# Patient Record
Sex: Male | Born: 1935 | Race: Black or African American | Hispanic: No | Marital: Married | Smoking: Never smoker
Health system: Southern US, Community
[De-identification: ages and names within clinical notes are randomized; demographics above are authoritative.]

## PROBLEM LIST (undated history)

## (undated) DIAGNOSIS — I2510 Atherosclerotic heart disease of native coronary artery without angina pectoris: Secondary | ICD-10-CM

## (undated) DIAGNOSIS — M1990 Unspecified osteoarthritis, unspecified site: Secondary | ICD-10-CM

## (undated) DIAGNOSIS — E785 Hyperlipidemia, unspecified: Secondary | ICD-10-CM

## (undated) DIAGNOSIS — F0390 Unspecified dementia without behavioral disturbance: Secondary | ICD-10-CM

## (undated) DIAGNOSIS — I480 Paroxysmal atrial fibrillation: Secondary | ICD-10-CM

## (undated) DIAGNOSIS — C801 Malignant (primary) neoplasm, unspecified: Secondary | ICD-10-CM

## (undated) DIAGNOSIS — R001 Bradycardia, unspecified: Secondary | ICD-10-CM

## (undated) DIAGNOSIS — I10 Essential (primary) hypertension: Secondary | ICD-10-CM

## (undated) HISTORY — DX: Essential (primary) hypertension: I10

## (undated) HISTORY — PX: CORONARY ANGIOPLASTY: SHX604

## (undated) HISTORY — PX: TONSILLECTOMY: SUR1361

## (undated) HISTORY — DX: Hyperlipidemia, unspecified: E78.5

## (undated) HISTORY — DX: Paroxysmal atrial fibrillation: I48.0

## (undated) HISTORY — DX: Atherosclerotic heart disease of native coronary artery without angina pectoris: I25.10

## (undated) HISTORY — PX: CARDIAC CATHETERIZATION: SHX172

## (undated) HISTORY — PX: MULTIPLE TOOTH EXTRACTIONS: SHX2053

---

## 2006-10-16 ENCOUNTER — Ambulatory Visit: Payer: Self-pay | Admitting: Orthopedic Surgery

## 2007-01-16 ENCOUNTER — Ambulatory Visit: Payer: Self-pay | Admitting: Orthopedic Surgery

## 2007-04-14 ENCOUNTER — Ambulatory Visit: Payer: Self-pay | Admitting: Orthopedic Surgery

## 2007-12-11 ENCOUNTER — Ambulatory Visit: Payer: Self-pay | Admitting: Orthopedic Surgery

## 2007-12-11 DIAGNOSIS — M1710 Unilateral primary osteoarthritis, unspecified knee: Secondary | ICD-10-CM

## 2007-12-11 DIAGNOSIS — M25569 Pain in unspecified knee: Secondary | ICD-10-CM | POA: Insufficient documentation

## 2007-12-11 DIAGNOSIS — IMO0002 Reserved for concepts with insufficient information to code with codable children: Secondary | ICD-10-CM | POA: Insufficient documentation

## 2008-07-22 ENCOUNTER — Ambulatory Visit: Payer: Self-pay | Admitting: Orthopedic Surgery

## 2008-07-22 DIAGNOSIS — M48061 Spinal stenosis, lumbar region without neurogenic claudication: Secondary | ICD-10-CM | POA: Insufficient documentation

## 2008-08-12 ENCOUNTER — Ambulatory Visit (HOSPITAL_COMMUNITY): Admission: RE | Admit: 2008-08-12 | Discharge: 2008-08-12 | Payer: Self-pay | Admitting: Gastroenterology

## 2008-08-12 ENCOUNTER — Ambulatory Visit: Payer: Self-pay | Admitting: Gastroenterology

## 2008-10-15 ENCOUNTER — Emergency Department (HOSPITAL_COMMUNITY): Admission: EM | Admit: 2008-10-15 | Discharge: 2008-10-15 | Payer: Self-pay | Admitting: Emergency Medicine

## 2008-10-15 ENCOUNTER — Encounter: Payer: Self-pay | Admitting: Orthopedic Surgery

## 2008-11-14 ENCOUNTER — Emergency Department (HOSPITAL_COMMUNITY): Admission: EM | Admit: 2008-11-14 | Discharge: 2008-11-14 | Payer: Self-pay | Admitting: Emergency Medicine

## 2008-11-16 ENCOUNTER — Ambulatory Visit (HOSPITAL_COMMUNITY): Admission: RE | Admit: 2008-11-16 | Discharge: 2008-11-16 | Payer: Self-pay | Admitting: Family Medicine

## 2008-12-14 ENCOUNTER — Encounter (HOSPITAL_COMMUNITY): Admission: RE | Admit: 2008-12-14 | Discharge: 2008-12-23 | Payer: Self-pay | Admitting: Family Medicine

## 2008-12-30 ENCOUNTER — Encounter (HOSPITAL_COMMUNITY): Admission: RE | Admit: 2008-12-30 | Discharge: 2009-01-29 | Payer: Self-pay | Admitting: Family Medicine

## 2009-02-03 ENCOUNTER — Encounter (HOSPITAL_COMMUNITY): Admission: RE | Admit: 2009-02-03 | Discharge: 2009-03-05 | Payer: Self-pay | Admitting: Family Medicine

## 2009-02-07 ENCOUNTER — Ambulatory Visit: Payer: Self-pay | Admitting: Orthopedic Surgery

## 2009-06-13 ENCOUNTER — Ambulatory Visit (HOSPITAL_COMMUNITY): Admission: RE | Admit: 2009-06-13 | Discharge: 2009-06-13 | Payer: Self-pay

## 2010-05-11 ENCOUNTER — Ambulatory Visit: Payer: Self-pay | Admitting: Orthopedic Surgery

## 2010-05-11 DIAGNOSIS — M25559 Pain in unspecified hip: Secondary | ICD-10-CM | POA: Insufficient documentation

## 2010-05-12 ENCOUNTER — Telehealth: Payer: Self-pay | Admitting: Orthopedic Surgery

## 2010-05-15 ENCOUNTER — Encounter (INDEPENDENT_AMBULATORY_CARE_PROVIDER_SITE_OTHER): Payer: Self-pay | Admitting: *Deleted

## 2010-05-15 ENCOUNTER — Telehealth: Payer: Self-pay | Admitting: Orthopedic Surgery

## 2010-05-15 ENCOUNTER — Ambulatory Visit (HOSPITAL_COMMUNITY): Admission: RE | Admit: 2010-05-15 | Discharge: 2010-05-15 | Payer: Self-pay | Admitting: Orthopedic Surgery

## 2010-05-16 ENCOUNTER — Telehealth: Payer: Self-pay | Admitting: Orthopedic Surgery

## 2010-05-17 ENCOUNTER — Telehealth: Payer: Self-pay | Admitting: Orthopedic Surgery

## 2010-05-19 ENCOUNTER — Encounter: Payer: Self-pay | Admitting: Orthopedic Surgery

## 2010-07-11 ENCOUNTER — Encounter (INDEPENDENT_AMBULATORY_CARE_PROVIDER_SITE_OTHER): Payer: Self-pay | Admitting: *Deleted

## 2010-07-11 ENCOUNTER — Encounter: Payer: Self-pay | Admitting: Orthopedic Surgery

## 2010-08-09 ENCOUNTER — Telehealth: Payer: Self-pay | Admitting: Orthopedic Surgery

## 2010-08-09 ENCOUNTER — Encounter: Payer: Self-pay | Admitting: Orthopedic Surgery

## 2010-08-16 ENCOUNTER — Ambulatory Visit: Payer: Self-pay | Admitting: Orthopedic Surgery

## 2010-09-18 HISTORY — PX: OTHER SURGICAL HISTORY: SHX169

## 2010-09-23 ENCOUNTER — Emergency Department (HOSPITAL_COMMUNITY): Admission: EM | Admit: 2010-09-23 | Discharge: 2010-09-23 | Payer: Self-pay | Admitting: Emergency Medicine

## 2010-09-26 ENCOUNTER — Telehealth (INDEPENDENT_AMBULATORY_CARE_PROVIDER_SITE_OTHER): Payer: Self-pay | Admitting: *Deleted

## 2010-09-27 ENCOUNTER — Encounter (INDEPENDENT_AMBULATORY_CARE_PROVIDER_SITE_OTHER): Payer: Self-pay | Admitting: *Deleted

## 2010-10-03 ENCOUNTER — Encounter: Payer: Self-pay | Admitting: Orthopedic Surgery

## 2010-10-04 ENCOUNTER — Encounter: Payer: Self-pay | Admitting: Orthopedic Surgery

## 2010-10-05 ENCOUNTER — Emergency Department (HOSPITAL_COMMUNITY)
Admission: EM | Admit: 2010-10-05 | Discharge: 2010-10-05 | Payer: Self-pay | Source: Home / Self Care | Admitting: Emergency Medicine

## 2010-11-06 ENCOUNTER — Telehealth: Payer: Self-pay | Admitting: Orthopedic Surgery

## 2010-12-06 ENCOUNTER — Ambulatory Visit: Payer: Self-pay | Admitting: Orthopedic Surgery

## 2010-12-12 ENCOUNTER — Ambulatory Visit: Payer: Self-pay | Admitting: Orthopedic Surgery

## 2010-12-27 ENCOUNTER — Ambulatory Visit
Admission: RE | Admit: 2010-12-27 | Discharge: 2010-12-27 | Payer: Self-pay | Source: Home / Self Care | Attending: Orthopedic Surgery | Admitting: Orthopedic Surgery

## 2011-01-04 ENCOUNTER — Ambulatory Visit
Admission: RE | Admit: 2011-01-04 | Discharge: 2011-01-04 | Payer: Self-pay | Source: Home / Self Care | Attending: Orthopedic Surgery | Admitting: Orthopedic Surgery

## 2011-01-05 ENCOUNTER — Ambulatory Visit (HOSPITAL_COMMUNITY)
Admission: RE | Admit: 2011-01-05 | Discharge: 2011-01-05 | Payer: Self-pay | Source: Home / Self Care | Attending: Urology | Admitting: Urology

## 2011-01-23 NOTE — Miscellaneous (Signed)
Summary: oxycodone in drug test  Clinical Lists Changes   Advised Dr. Ozzie Hoyle office, no oxycodone given from our office on Norco and Tylenol 3. Pt had oxycodone in blood test

## 2011-01-23 NOTE — Progress Notes (Signed)
Summary: Patient called about Rosann Auerbach Rx question, call back to [facility]  Phone Note Call from Patient   Caller: Patient Summary of Call: Patient called to relay that he has still been unable to get the Synvisc through his mail order pharmacy, which is [place].  He states he needs a "Medicare denial" in order for his Rosann Auerbach Rx plan to cover it.  I explained that we are unable to bill Medicare first for any prescriptions as we are not a supplier.  Advised patient that he may need to contact Medicare directly, and that I will re-check w/his mail-in and local pharmacies.   I did contact both [employer] and [facility] to verify that this is what is requested.  Per [employer]: PH# [phone]. on Specialty Rx form signed by Dr Romeo Apple on 10/02/10   I spoke w/Idris, and he said that the order is cancelled until "Medicare denial is received."  He also suggests that patient contact Medicare, his Plan B, to discuss with them. (Note patient does not have Plan D Rx coverage with Medicare). [facility],[place] [phone], spoke w/pharmacist Elon Jester - states she still has his original Rx for Synvisc "on hold."  She cannot guarantee she can get a denial but said she will try to help hiim. Initial call taken by: Cammie Sickle,  November 06, 2010 1:49 PM  Follow-up for Phone Call        Elon Jester from [facility] called back to relay that she pursued the Synvisc Rx with Medicare and that "they will cover it only if our office purchases & dispenses it."  Message left for patient re:  responses received. Follow-up by: Cammie Sickle,  November 06, 2010 6:25 PM

## 2011-01-23 NOTE — Progress Notes (Signed)
Summary: Referral to Dr. Eduard Clos.  Phone Note Outgoing Call   Call placed by: Waldon Reining,  May 16, 2010 10:59 AM Call placed to: Specialist Action Taken: Information Sent Summary of Call: I faxed a referral for this patient to Dr. Eduard Clos for ESI injections.

## 2011-01-23 NOTE — Assessment & Plan Note (Signed)
Summary: FLUID LT KNEE/MEDICARE,BC,TRICARE/CAF    Chief Complaint:  left knee pain.  History of Present Illness: I saw Karl Howell in the office today for a followup visit.  He is a 77 years old man with the complaint of:  left knee pain and swelling.  Patient was seen 01-16-07 for this problem and was diagnosed with osteoarthritis of the left knee.  Patient has been having these symptoms for over 2 years. No injury. Pain is worse going up and down steps. Pain stays in knee. Patient does not have any stiffness. He says that some days are better than others. His pain is achy.   Patient does not take any medicine for this. He has had fluid drawn off by [place] before and that helped for a while.  Last xrays were in 2007.   Prior Medications :  * MAXIDE  * LOPRESSOR  * ZOCOR     Current Allergies (reviewed today): No known allergies  Updated/Current Medications (including changes made in today's visit):  * MAXIDE  * LOPRESSOR  * ZOCOR    Past Medical History:    Reviewed history from 12/03/2007 and no changes required:       controlled htn       Knee Exam  General:    Well-developed, well-nourished, normal body habitus; no deformities, normal grooming.  Gait:    limp noted-left.    Skin:    Intact, no scars, lesions, rashes, cafe au lait spots, or bruising.    Inspection:    deformity: left knee, valgus mild   Palpation:    tenderness L-lateral joint line.    Vascular:    There was no swelling or varicose veins. The pulses and temperature are normal. There was no edema or tenderness.  Sensory:    Gross coordination and sensation were normal.    Motor:    Motor strength 5/5 bilaterally for quadriceps, hamstrings, ankle dorsiflexion, and ankle plantar flexion.    Knee Exam:    Left:    Inspection:  Abnormal    Palpation:  Abnormal    Stability:  stable    Tenderness:  lateral capsule    Swelling:  no    Range of Motion:       Flexion-Active: 120 degrees       Extension-Active: -2 degrees  Anterior drawer:    Left negative Posterior drawer:    Left negative Lachman :    Left negative MCL:    Left negative LCL:    Left negative    Impression & Recommendations:  Problem # 1:  DEGENERATIVE JOINT DISEASE, KNEE (ICD-715.96)  Orders: Est. Patient Level III (16109) Depo- Medrol 40mg  (J1030) Joint Aspirate / Injection, Large (60454)  Verbal consent was obtained. The left knee was prepped with alcohol and ethyl chloride. depomedrol 40mg /cc and sensorcaine .25% was injected. there were no complications.  You have received an injection of cortisone today. You may experience increased pain at the injection site. Apply ice pack to the area for 20 minutes every 2 hours and take 2 xtra strength tylenol every 8 hours. This increased pain will usually resolve in 24 hours. The injection will take effect in 3-10 days.   Knee x-ray complete,  4/more views (09811)   Problem # 2:  KNEE PAIN (ICD-719.46)  Orders: Est. Patient Level III (91478)    Patient Instructions: 1)  Please schedule a follow-up appointment as needed. 2)  You have received an injection of cortisone today. You may experience increased  pain at the injection site. Apply ice pack to the area for 20 minutes every 2 hours and take 2 xtra strength tylenol every 8 hours. This increased pain will usually resolve in 24 hours. The injection will take effect in 3-10 days.     ]

## 2011-01-23 NOTE — Consult Note (Signed)
Summary: Consult Dr Nickola Major  Consult Dr Nickola Major   Imported By: Cammie Sickle 08/15/2010 11:53:10  _____________________________________________________________________  External Attachment:    Type:   Image     Comment:   External Document

## 2011-01-23 NOTE — Progress Notes (Signed)
Summary: Office Visit  Office Visit   Imported By: Jacklynn Ganong 12/03/2007 15:11:43  _____________________________________________________________________  External Attachment:    Type:   Image     Comment:   progress note

## 2011-01-23 NOTE — Letter (Signed)
Summary: ESI referral order  ESI referral order   Imported By: Jacklynn Ganong 07/13/2010 10:20:04  _____________________________________________________________________  External Attachment:    Type:   Image     Comment:   External Document

## 2011-01-23 NOTE — Progress Notes (Signed)
Summary: Appointment to see Dr. Eduard Clos.  Phone Note From Other Clinic   Caller: Referral Coordinator Summary of Call: Melissa called and said they could not get the patient in for an injection before his trip because he is on Plavix, so they  have him scheduled when he gets back. They did give him a prescription for Lyrica for to help with his pain. Initial call taken by: Waldon Reining,  May 17, 2010 3:28 PM

## 2011-01-23 NOTE — Assessment & Plan Note (Signed)
  Xrays show valgus OA left knee moderate

## 2011-01-23 NOTE — Assessment & Plan Note (Signed)
Summary: RE-CHECK LT KNEE/NEED XRAYS/MEDICARE,BCBS,TRICARE/CAF   Visit Type:  Follow-up  CC:  recheck left knee with xrays.  History of Present Illness: 77 year old male previously treated for osteoarthritis of LEFT knee.  Was advised to have a knee replacement but he said he had some traveling to doing what rather rate to the trips are complete.  DX: OA LEFT KNEE; SPINAL STENOSIS  PREV TREATMENT: INJECT LEFT KNEE   Presents back today for recurrent pain in the LEFT knee, left hip and left leg.   02/07/09 last injection left knee for DJD, OA, the knee injection helped.  He went to the [place], received Prednisone  5 mg;   12 days, today is 4th day of med, the med does not help.     Allergies: No Known Drug Allergies  Past History:  Past Medical History: Last updated: 12/03/2007 controlled htn  Past Surgical History: Last updated: 12/03/2007 none  Review of Systems Constitutional:  Denies weight loss, fever, and chills. Neurologic:  Denies numbness and tingling. Musculoskeletal:  Complains of joint pain, stiffness, and muscle pain. Skin:  Denies rash.  Physical Exam  General:  Well developed, well nourished, normal body habitus; no deformities, normal grooming. Pulses:  The pulses and perfusion were normal with normal color, temperature  and no swelling  Extremities:  tenderness in the left gluteus, left ischium,   left knee normal ROM , no effusion, no tenderness, grade 5 motor  Neurologic:  The coordination and sensation were normal  The reflexes were normal  + SLR left sitting at 60 degrees   Skin:  intact without lesions or rashes Cervical Nodes:  no significant adenopathy Psych:  alert and cooperative; normal mood and affect; normal attention span and concentration   Impression & Recommendations:  Problem # 1:  SPINAL STENOSIS, LUMBAR (ICD-724.02) Assessment Deteriorated  injection left hip for pain  neurontin pain reliever   Orders: Est. Patient  Level III (91478) Joint Aspirate / Injection, Large (20610) Depo- Medrol 40mg  (J1030)  Medications Added to Medication List This Visit: 1)  Neurontin 100 Mg Caps (Gabapentin) .[name].. 1 by mouth three times a day 2)  Norco 5-325 Mg Tabs (Hydrocodone-acetaminophen) .[name].. 1 q 6 hrs as needed pain  Patient Instructions: 1)  Start Neurontin 100 mg three times a day  2)  MRI L spine ASAP to prepare for epidurals  Prescriptions: NORCO 5-325 MG TABS (HYDROCODONE-ACETAMINOPHEN) 1 q 6 hrs as needed pain  #60 x 1   Entered and Authorized by:   Fuller Canada MD   Signed by:   Fuller Canada MD on 05/11/2010   Method used:   Print then Give to Patient   RxID:   [id number] NEURONTIN 100 MG CAPS (GABAPENTIN) 1 by mouth three times a day  #60 x 1   Entered and Authorized by:   Fuller Canada MD   Signed by:   Fuller Canada MD on 05/11/2010   Method used:   Print then Give to Patient   RxID:   [id number]

## 2011-01-23 NOTE — Assessment & Plan Note (Signed)
Summary: RE-CK LT KNEE,REVIEW RESULTS/MEDICARE/CAF   Visit Type:  Follow-up  CC:  recheck back pain.  History of Present Illness: 77 year old male followed for osteoarthritis of his LEFT knee and spinal stenosis.  We advised the replacement surgery but the patient was headed to [place] believe [place] and had to postpone surgery.  He did eventually go to [place] and during that time his back pain and knee pain improved.  He just has a small amount of symptoms at this time.  DX: OA LEFT KNEE; SPINAL STENOSIS  PREV TREATMENT: INJECT LEFT KNEE   Presents back today for recurrent pain in the LEFT knee, left hip and left leg.   the MRI which was doneIn the last month shows the following:  IMPRESSION: 1.  Transitional anatomy. Correlation with radiographs is recommended prior to any operative intervention. 2.  Chronic severe lower lumbar facet degeneration.  Mildly increased spondylolisthesis at L4-L5 and L5-S1 since 2009, grade 1. Superimposed chronic lower lumbar disc degeneration. 3.  Multifactorial moderate to severe spinal stenosis at L3-L4. Moderate spinal stenosis at L4-L5 with severe bilateral lateral recess stenosis. 4.  Severe left lateral recess stenosis and moderate bilateral neural foraminal  stenosis at L5-S1.   right now he is doing well and we would like to postpone any further intervention at this time   Allergies (verified): No Known Drug Allergies   Impression & Recommendations:  Problem # 1:  SPINAL STENOSIS, LUMBAR (ICD-724.02) Assessment Comment Only  Problem # 2:  DEGENERATIVE JOINT DISEASE, KNEE (ICD-715.96) Assessment: Comment Only  His updated medication list for this problem includes:     Nabumetone 500 Mg Tabs (Nabumetone) .[name]... 1 by mouth two times a day    Tylenol With Codeine #3 300-30 Mg Tabs (Acetaminophen-codeine) .[name]... 1 q 4 as needed pain   We decided to treat it with an anti-inflammatory and a light pain reliever, with codeine.  The Norco has  been stopped  Orders: Est. Patient Level II (32440)  Medications Added to Medication List This Visit: 1)  Nabumetone 500 Mg Tabs (Nabumetone) .[name].. 1 by mouth two times a day 2)  Tylenol With Codeine #3 300-30 Mg Tabs (Acetaminophen-codeine) .[name].. 1 q 4 as needed pain  Patient Instructions: 1)  3 month follow up  2)  2 medicines to take  Prescriptions: TYLENOL WITH CODEINE #3 300-30 MG TABS (ACETAMINOPHEN-CODEINE) 1 q 4 as needed pain  #84 x 5   Entered and Authorized by:   Fuller Canada MD   Signed by:   Fuller Canada MD on 08/16/2010   Method used:   Print then Give to Patient   RxID:   [id number] NABUMETONE 500 MG TABS (NABUMETONE) 1 by mouth two times a day  #60 x 2   Entered and Authorized by:   Fuller Canada MD   Signed by:   Fuller Canada MD on 08/16/2010   Method used:   Print then Give to Patient   RxID:   [id number]

## 2011-01-23 NOTE — Progress Notes (Signed)
Summary: MRI appointment  Phone Note Outgoing Call   Call placed by: Waldon Reining,  May 12, 2010 9:45 AM Call placed to: Patient Action Taken: Appt scheduled Summary of Call: I called and left a message with patient's MRI appointment at [facility] on 05-15-10 at 8:30. Patient has Medicare, no precert is needed.

## 2011-01-23 NOTE — Miscellaneous (Signed)
Summary: refaxed ESI referral to  [facility]  Clinical Lists Changes

## 2011-01-23 NOTE — Progress Notes (Signed)
Summary: pharmacycall about Synvisc Rx   Phone Note From Pharmacy   [facility] Summary of Call: Rec'd [facility] from [facility]: Synvisc [facility]. PH [phone]. Left voice msg - states patient's insurance (Medicare) does not cover; however, patient has a separate private RX plan with [employer] ins.  - I called patient; left message to fol up on status.  - [facility]; spoke w/Scott, pharmacist; they have faxed the Rx to [employer] to ck on available benefits for this Rx through them. Initial call taken by: Cammie Sickle,  May 15, 2010 1:20 PM

## 2011-01-23 NOTE — Medication Information (Signed)
Summary: [facility] fax verification  [facility] fax verification   Imported By: Cammie Sickle 05/30/2010 12:02:36  _____________________________________________________________________  External Attachment:    Type:   Image     Comment:   External Document

## 2011-01-23 NOTE — Assessment & Plan Note (Signed)
Summary: LT KNEE PAIN"TO HIP"/HAD [facility] 11/2007/?XRAY/MEDICARE,BC,TRIC...    History of Present Illness: I saw Karl Howell in the office today for a followup visit.  He is a 77 years old man with the complaint of:  left hip pain (buttocks).  Patient states that he has had this problem for a long time, it usually comes and goes.   He was last seen for this on 04-14-07, but it was the right side.  He says that he has a bad back.  He has not tried any medication, he only takes aspirin.  Interestingly he says that when he walks his pain gets better. His symptoms are more constant now. When he sits he has pain. The pain in his left buttock.    Updated Prior Medication List: * MAXIDE  * LOPRESSOR  * ZOCOR   Current Allergies: No known allergies      Review of Systems       no radiation of pain to the legs.   Physical Exam  This is a well-developed well-nourished male Coumadin hygiene and tach is awake alert nighttime 3 mood and affect are normal normal cardiovascular exam. Skin is warm dry and intact.  Back examination  No tenderness in the lumbar spine no tenderness in the left buttock. Leg lengths are equal. Hip range of motion normal without pain. Straight leg raise negative. He did have pain on extension of the back.    Impression & Recommendations:  Problem # 1:  SPINAL STENOSIS, LUMBAR (ICD-724.02) Assessment: Deteriorated previous x-rays show spinal stenosis. Orders: Est. Patient Level III (16109)   Medications Added to Medication List This Visit: 1)  Sterapred Ds 12 Day 10 Mg Tabs (Prednisone) .... As diorected   Patient Instructions: 1)  take dose pack as directed  2)  continue to walk daily  3)  Please schedule a follow-up appointment as needed.   Prescriptions: STERAPRED DS 12 DAY 10 MG  TABS (PREDNISONE) as diorected  #1 x 1   Entered and Authorized by:   Fuller Canada MD   Signed by:   Fuller Canada MD on 07/22/2008   Method used:   [facility]  to ...       [facility] [phone]*       [address]       [place], [place]  [address]       Ph: [phone] or [phone]       Fax: [phone]   RxID:   [id number]  ]

## 2011-01-23 NOTE — Progress Notes (Signed)
Summary: Patient requesting results and appointment  Phone Note Call from Patient   Caller: Patient Summary of Call: Patient stopped in to request appt for LT knee "Same problem started with, in May."  He travels w/job and is back in town today.    Has seen Dr Eduard Clos, and reports have been requested today as not received.  - (1) Patient said he has brought in MRI film and has not received the results.  - (2) Please advise if need to schedule fol/up appointment here re: knee as patient is requesting. CELL PH # [phone] /  Home Ph [phone] Initial call taken by: Cammie Sickle,  August 09, 2010 3:01 PM  Follow-up for Phone Call        yes   appt check knee   re visit problem with dr Eduard Clos amd MRI results  Follow-up by: Fuller Canada MD,  August 10, 2010 3:06 PM  Additional Follow-up for Phone Call Additional follow up Details #1::        Called patient and left voice mail msg ( @both  #'s) Additional Follow-up by: Cammie Sickle,  August 11, 2010 9:52 AM    Additional Follow-up for Phone Call Additional follow up Details #2::    Pt left msg on our ans machine.  I left fol/up msg at cell, offered Wed appt. Follow-up by: Cammie Sickle,  August 14, 2010 12:47 PM  Additional Follow-up for Phone Call Additional follow up Details #3:: Details for Additional Follow-up Action Taken: (1) Received consult report (2) Spoke w/patient and scheduled appt per Dr Romeo Apple Additional Follow-up by: Cammie Sickle,  August 15, 2010 11:45 AM

## 2011-01-23 NOTE — Assessment & Plan Note (Signed)
Summary: knee pain, giving out/frs    History of Present Illness: I saw Karl Howell in the office today for a followup visit.  He is a 77 years old man with the complaint of: " leg bothering me" "right in the knee"  I cant do the things I want to do. I can't ski. I am having trouble doing simple things such as walking.  The knee gave out going up a hill  12/11/07 last left knee xrays, also has l spine xrays from 10/15/08 for review.  Diffuse knee pain   xrays show lateral compartment OA, PTF OA        Updated Prior Medication List: * MAXIDE  * LOPRESSOR  * ZOCOR  STERAPRED DS 12 DAY 10 MG  TABS (PREDNISONE) as diorected  Current Allergies (reviewed today): No known allergies   Past Medical History:    Reviewed history from 12/03/2007 and no changes required:       controlled htn  Past Surgical History:    Reviewed history from 12/03/2007 and no changes required:       none     Review of Systems      See HPI  MS      Complains of joint pain.   Physical Exam  Constitutional: normal appearance   CDV: normal pulse and perfusion  Skin: normal  Neuro: normal sensation  Psyche: awake and alert  MSK:  the LEFT knee is tender and swollen, there is normal muscle tone, flexion is 118.  Knee is stable.  There is medial joint line and lateral joint line tenderness.    Impression & Recommendations:  Problem # 1:  SPINAL STENOSIS, LUMBAR (ICD-724.02) Assessment: Unchanged  Problem # 2:  KNEE PAIN (EAV-409.81) Assessment: Deteriorated  Orders: Est. Patient Level III (19147) Joint Aspirate / Injection, Large (20610)- LEFT knee Verbal consent was obtained. The knee was prepped with alcohol and ethyl chloride. 1 cc of depomedrol 40mg /cc and 4 cc of lidocaine 1% was injected. there were no complications.  Depo- Medrol 40mg  (J1030)   Problem # 3:  DEGENERATIVE JOINT DISEASE, KNEE (ICD-715.96) Assessment: Deteriorated We discussed treatment options at this  point and basically wants to look at his x-rays he has end-stage arthritis of the LEFT knee although some of the cartilage is still preserved with open joint space.  Arthroscopic surgery is not proven to be beneficial in this setting unless they're mechanical symptoms such as locking clicking popping or giving way.  He does have some lumbar spinal stenosis which I think is the cause of his leg giving out as this happen with no excessive loading of the joint no twisting or simple walking.  I offered him an injection and patient Brochure  on total knee arthroplasty and he will think about having the surgery prior to [place] as a 6 month day for a trip to [place]. Orders: Est. Patient Level III (82956)    Patient Instructions: 1)  Please schedule a follow-up appointment as needed.

## 2011-01-23 NOTE — Medication Information (Signed)
Summary: Form for Synvisc  Form for Synvisc   Imported By: Jacklynn Ganong 10/04/2010 08:15:20  _____________________________________________________________________  External Attachment:    Type:   Image     Comment:   External Document

## 2011-01-23 NOTE — Miscellaneous (Signed)
Summary: L:4-5 esi order  Clinical Lists Changes  SI L4-L5 series of 3 injections Orders: Added new Referral order of Misc. Referral (Misc. Ref) - Signed

## 2011-01-23 NOTE — Progress Notes (Signed)
Summary: call from pharmacy   Phone Note From Pharmacy   Caller: [facility] Summary of Call: [facility] (ph [phone]) called  RE: Rx for Synvisc,  to relay that patient has [employer] for injectable medications.  They have fwd'd his RX to [place]. Their ph # is [phone].  [facility] has notified patient of this information (states left him a message). Initial call taken by: Cammie Sickle,  September 26, 2010 1:31 PM  Follow-up for Phone Call        ok Follow-up by: Ether Griffins,  September 26, 2010 1:43 PM

## 2011-01-23 NOTE — Letter (Signed)
Summary: Drug screen  report  Drug screen  report   Imported By: Jacklynn Ganong 10/03/2010 16:26:38  _____________________________________________________________________  External Attachment:    Type:   Image     Comment:   External Document

## 2011-01-25 NOTE — Assessment & Plan Note (Signed)
Summary: 2nd synvisc injection/mcr/   Visit Type:  Follow-up  CC:  left knee pain.  History of Present Illness: I saw Karl Howell in the office today for a followup visit.  He is a 77 years old man with the complaint of:  left knee.  2nd synvisc injection today.  CC:  left knee pain.  History of Present Illness: I saw Karl Howell in the office today for a followup visit.  He is a 77 years old man with the complaint of:  left knee  2nd  Synvisc injection today.   The patient is having pain and loss of function in the knee. Other conservative measures have failed (nsaids, rest, exercise, attempts at wt loss)   LEFT knee.  Sterile prep the LEFT knee and ethylchloride for anesthesia. Lateral portal injection of Synvisc. No complications   Allergies:  No Known Drug Allergies   Impression & Recommendations:  Problem # 1:  KNEE PAIN (ICD-719.46)  His updated medication list for this problem includes:    Norco 5-325 Mg Tabs (Hydrocodone-acetaminophen) .[name]... 1 q 6 hrs as needed pain    Nabumetone 500 Mg Tabs (Nabumetone) .[name]... 1 by mouth two times a day    Tylenol With Codeine #3 300-30 Mg Tabs (Acetaminophen-codeine) .[name]... 1 q 4 as needed pain  Orders: Synvisc (20610M)  Problem # 2:  DEGENERATIVE JOINT DISEASE, KNEE (ICD-715.96)  His updated medication list for this problem includes:    Norco 5-325 Mg Tabs (Hydrocodone-acetaminophen) .[name]... 1 q 6 hrs as needed pain    Nabumetone 500 Mg Tabs (Nabumetone) .[name]... 1 by mouth two times a day    Tylenol With Codeine #3 300-30 Mg Tabs (Acetaminophen-codeine) .[name]... 1 q 4 as needed pain  Orders: Synvisc (21308M)  Patient Instructions: 1)  Please schedule a follow-up appointment in 2 weeks. 2)  2nd synvisc injection    Orders Added: 1)  Synvisc [20610M]   Allergies: No Known Drug Allergies   Impression & Recommendations:  Problem # 1:  DEGENERATIVE JOINT DISEASE, KNEE (ICD-715.96) Assessment Comment Only  His updated  medication list for this problem includes:    Norco 5-325 Mg Tabs (Hydrocodone-acetaminophen) .[name]... 1 q 6 hrs as needed pain    Nabumetone 500 Mg Tabs (Nabumetone) .[name]... 1 by mouth two times a day    Tylenol With Codeine #3 300-30 Mg Tabs (Acetaminophen-codeine) .[name]... 1 q 4 as needed pain  Orders: Synvisc (20610M)  Problem # 2:  KNEE PAIN (VHQ-469.62) Assessment: Comment Only  His updated medication list for this problem includes:    Norco 5-325 Mg Tabs (Hydrocodone-acetaminophen) .[name]... 1 q 6 hrs as needed pain    Nabumetone 500 Mg Tabs (Nabumetone) .[name]... 1 by mouth two times a day    Tylenol With Codeine #3 300-30 Mg Tabs (Acetaminophen-codeine) .[name]... 1 q 4 as needed pain  Orders: Synvisc (20610M)  Patient Instructions: 1)  1 week for injection of synvisc    Orders Added: 1)  Synvisc [20610M]

## 2011-01-25 NOTE — Assessment & Plan Note (Signed)
Summary: INJECTION #3 SYNVISC/LT KNEE/MEDICARE,CIGNA,TRICARE/CAF   History of Present Illness: Karl Howell presents for his third of 3 Synvisc injections LEFT knee fracture arthritis failure of nonoperative and non-invasive care  Under sterile conditions his own Synvisc medication was injected into the LEFT knee but no complications      Allergies: No Known Drug Allergies   Other Orders: Synvisc (04540J)  Patient Instructions: 1)  Please schedule a follow-up appointment in 2 months.   Orders Added: 1)  Synvisc [20610M]

## 2011-01-25 NOTE — Assessment & Plan Note (Signed)
Summary: 3 M RE-CK/RESP TO MED/DISCUSS SYNV RX/MEDICARE,CIGNA,TRICARE/CAF   Visit Type:  Follow-up  CC:  back and knees.  History of Present Illness: a 89, male with diagnosis of degenerative disc disease, and osteoarthritis, LEFT knee  Recheck to response of medication  Nabumetone 500 Mg Tabs (Nabumetone) .[name]... 1 by mouth two times a day Tylenol With Codeine #3 300-30 Mg Tabs (Acetaminophen-codeine) .[name]... 1 q 4 as needed pain  he really only takes the medication, 20s, hurting. He will take an occasional tramadol for his back, which is feeling better since he got a special new shoes  He also needs a new prescription for Synvisc.  otherwise he is in stable condition except for his knee pain    Allergies: No Known Drug Allergies   Impression & Recommendations:  Problem # 1:  SPINAL STENOSIS, LUMBAR (ICD-724.02)  Orders: Est. Patient Level II ([address])  Problem # 2:  DEGENERATIVE JOINT DISEASE, KNEE (ICD-715.96)  His updated medication list for this problem includes:    Norco 5-325 Mg Tabs (Hydrocodone-acetaminophen) .[name]... 1 q 6 hrs as needed pain    Nabumetone 500 Mg Tabs (Nabumetone) .[name]... 1 by mouth two times a day    Tylenol With Codeine #3 300-30 Mg Tabs (Acetaminophen-codeine) .[name]... 1 q 4 as needed pain  Orders: Est. Patient Level II (60454)  Patient Instructions: 1)  Take your Synvisc rx to your pharmacy, call us when you get the medication and we will get you in for your 1st Synvisc injection.   Orders Added: 1)  Est. Patient Level II [09811]

## 2011-01-25 NOTE — Medication Information (Signed)
Summary: Tax adviser   Imported By: Cammie Sickle 12/07/2010 11:33:26  _____________________________________________________________________  External Attachment:    Type:   Image     Comment:   External Document

## 2011-01-25 NOTE — Assessment & Plan Note (Signed)
Summary: 1st synvisc injection/frs   Visit Type:  Follow-up  CC:  left knee pain.  History of Present Illness: I saw Karl Howell in the office today for a followup visit.  He is a 77 years old man with the complaint of:  left knee  1st Synvisc injection today.    The patient is having pain and loss of function in the knee. Other conservative measures have failed (nsaids, rest, exercise, attempts at wt loss)   LEFT knee.  Sterile prep the LEFT knee and ethylchloride for anesthesia. Lateral portal injection of Synvisc. No complications   Allergies: No Known Drug Allergies   Impression & Recommendations:  Problem # 1:  KNEE PAIN (ICD-719.46)  His updated medication list for this problem includes:    Norco 5-325 Mg Tabs (Hydrocodone-acetaminophen) .[name]... 1 q 6 hrs as needed pain    Nabumetone 500 Mg Tabs (Nabumetone) .[name]... 1 by mouth two times a day    Tylenol With Codeine #3 300-30 Mg Tabs (Acetaminophen-codeine) .[name]... 1 q 4 as needed pain  Orders: Synvisc (20610M)  Problem # 2:  DEGENERATIVE JOINT DISEASE, KNEE (ICD-715.96)  His updated medication list for this problem includes:    Norco 5-325 Mg Tabs (Hydrocodone-acetaminophen) .[name]... 1 q 6 hrs as needed pain    Nabumetone 500 Mg Tabs (Nabumetone) .[name]... 1 by mouth two times a day    Tylenol With Codeine #3 300-30 Mg Tabs (Acetaminophen-codeine) .[name]... 1 q 4 as needed pain  Orders: Synvisc (81191Y)  Patient Instructions: 1)  Please schedule a follow-up appointment in 2 weeks. 2)  2nd synvisc injection    Orders Added: 1)  Synvisc [20610M]

## 2011-03-08 ENCOUNTER — Ambulatory Visit: Payer: Self-pay | Admitting: Orthopedic Surgery

## 2011-03-08 LAB — URINALYSIS, ROUTINE W REFLEX MICROSCOPIC
Bilirubin Urine: NEGATIVE
Glucose, UA: NEGATIVE mg/dL
Hgb urine dipstick: NEGATIVE
Ketones, ur: NEGATIVE mg/dL
Nitrite: NEGATIVE
Protein, ur: NEGATIVE mg/dL
Specific Gravity, Urine: <1.005 — ABNORMAL LOW (ref 1.005–1.030)
Urobilinogen, UA: 0.2 mg/dL (ref 0.0–1.0)
pH: 7 (ref 5.0–8.0)

## 2011-03-08 LAB — BASIC METABOLIC PANEL WITH GFR
BUN: 14 mg/dL (ref 6–23)
BUN: 15 mg/dL (ref 6–23)
CO2: 27 meq/L (ref 19–32)
CO2: 30 meq/L (ref 19–32)
Calcium: 9.3 mg/dL (ref 8.4–10.5)
Calcium: 9.3 mg/dL (ref 8.4–10.5)
Chloride: 100 meq/L (ref 96–112)
Chloride: 101 meq/L (ref 96–112)
Creatinine, Ser: 1.2 mg/dL (ref 0.4–1.5)
Creatinine, Ser: 1.21 mg/dL (ref 0.4–1.5)
GFR calc non Af Amer: 59 mL/min — ABNORMAL LOW
GFR calc non Af Amer: 59 mL/min — ABNORMAL LOW
Glucose, Bld: 120 mg/dL — ABNORMAL HIGH (ref 70–99)
Glucose, Bld: 123 mg/dL — ABNORMAL HIGH (ref 70–99)
Potassium: 3.3 meq/L — ABNORMAL LOW (ref 3.5–5.1)
Potassium: 3.5 meq/L (ref 3.5–5.1)
Sodium: 136 meq/L (ref 135–145)
Sodium: 137 meq/L (ref 135–145)

## 2011-03-08 LAB — CBC
HCT: 37.3 % — ABNORMAL LOW (ref 39.0–52.0)
HCT: 38.9 % — ABNORMAL LOW (ref 39.0–52.0)
Hemoglobin: 12.4 g/dL — ABNORMAL LOW (ref 13.0–17.0)
Hemoglobin: 12.9 g/dL — ABNORMAL LOW (ref 13.0–17.0)
MCH: 27.2 pg (ref 26.0–34.0)
MCH: 27.3 pg (ref 26.0–34.0)
MCHC: 33.2 g/dL (ref 30.0–36.0)
MCHC: 33.2 g/dL (ref 30.0–36.0)
MCV: 82.1 fL (ref 78.0–100.0)
MCV: 82.2 fL (ref 78.0–100.0)
Platelets: 126 10*3/uL — ABNORMAL LOW (ref 150–400)
Platelets: 126 10*3/uL — ABNORMAL LOW (ref 150–400)
RBC: 4.55 MIL/uL (ref 4.22–5.81)
RBC: 4.74 MIL/uL (ref 4.22–5.81)
RDW: 13.1 % (ref 11.5–15.5)
RDW: 13.1 % (ref 11.5–15.5)
WBC: 5.2 10*3/uL (ref 4.0–10.5)
WBC: 6 10*3/uL (ref 4.0–10.5)

## 2011-03-08 LAB — DIFFERENTIAL
Basophils Absolute: 0 10*3/uL (ref 0.0–0.1)
Basophils Absolute: 0 10*3/uL (ref 0.0–0.1)
Basophils Relative: 0 % (ref 0–1)
Basophils Relative: 0 % (ref 0–1)
Eosinophils Absolute: 0 10*3/uL (ref 0.0–0.7)
Eosinophils Absolute: 0 10*3/uL (ref 0.0–0.7)
Eosinophils Relative: 0 % (ref 0–5)
Eosinophils Relative: 0 % (ref 0–5)
Lymphocytes Relative: 7 % — ABNORMAL LOW (ref 12–46)
Lymphocytes Relative: 9 % — ABNORMAL LOW (ref 12–46)
Lymphs Abs: 0.4 10*3/uL — ABNORMAL LOW (ref 0.7–4.0)
Lymphs Abs: 0.5 10*3/uL — ABNORMAL LOW (ref 0.7–4.0)
Monocytes Absolute: 0.2 10*3/uL (ref 0.1–1.0)
Monocytes Absolute: 0.3 10*3/uL (ref 0.1–1.0)
Monocytes Relative: 3 % (ref 3–12)
Monocytes Relative: 6 % (ref 3–12)
Neutro Abs: 4.5 10*3/uL (ref 1.7–7.7)
Neutro Abs: 5.3 10*3/uL (ref 1.7–7.7)
Neutrophils Relative %: 87 % — ABNORMAL HIGH (ref 43–77)
Neutrophils Relative %: 87 % — ABNORMAL HIGH (ref 43–77)

## 2011-04-03 ENCOUNTER — Ambulatory Visit (INDEPENDENT_AMBULATORY_CARE_PROVIDER_SITE_OTHER): Payer: Medicare Other | Admitting: Orthopedic Surgery

## 2011-04-03 DIAGNOSIS — M1710 Unilateral primary osteoarthritis, unspecified knee: Secondary | ICD-10-CM

## 2011-04-03 DIAGNOSIS — IMO0002 Reserved for concepts with insufficient information to code with codable children: Secondary | ICD-10-CM

## 2011-04-03 NOTE — Progress Notes (Signed)
77 year old male, status post 3 injections, LEFT knee for arthritis. Did well over his vacation until he tried to do some dancing he twisted and felt a pain. He really talked to him though his pain is intermittent. It reaches a level of 2/10 with occasional giving way, which is not constant.  He has no fluid on the knee today. He has near full extension, good flexion. Knee stable.  Impression is osteoarthritis, LEFT knee, status post Synvisc injections. Followup 3 months x-ray LEFT knee 3 views

## 2011-05-08 NOTE — Op Note (Signed)
NAMEJERRIT, Karl Howell                   ACCOUNT NO.:  [id number]   MEDICAL RECORD NO.:  [id number]          PATIENT TYPE:  AMB   LOCATION:  DAY                           FACILITY:  [facility]   PHYSICIAN:  Kassie Mends, M.D.      DATE OF BIRTH:  07-02-1934   DATE OF PROCEDURE:  08/12/2008  DATE OF DISCHARGE:                               OPERATIVE REPORT   REFERRING PHYSICIAN:  Melvyn Novas, MD   PROCEDURE:  Colonoscopy.   INDICATION FOR EXAMINATION:  Karl Howell is a 77 year old male who presents  for average risk colon cancer screening.   FINDINGS:  Rare sigmoid colon diverticula.  Moderate internal  hemorrhoids.  Otherwise, no polyps, masses, inflammatory changes, or  arteriovenous malformation seen.   RECOMMENDATIONS:  1. Screening colonoscopy in 10 years.  2. He should follow a high-fiber diet.  He is given a handout on high-      fiber diet, diverticulosis, and hemorrhoids.   MEDICATIONS:  1. Demerol 75 mg IV.  2. Versed 3 mg IV.   PROCEDURE TECHNIQUE:  Physical exam was performed and informed consent  was obtained from the patient after explaining the benefits, risks, and  alternatives of the procedure.  The patient was connected to the monitor  and placed in the left lateral position.  Continuous oxygen was provided  by nasal cannula and IV medicine administered with an indwelling  cannula.  After administration of sedation and rectal exam, the  patient's rectum was intubated and scope was advanced under direct  visualization to the cecum.  The scope was removed slowly by carefully  examining the color, texture, anatomy, and integrity of the mucosa on  the way out.  The patient was recovered in endoscopy and discharged home  in satisfactory condition.      Kassie Mends, M.D.  Electronically Signed    SM/MEDQ  D:  08/12/2008  T:  08/12/2008  Job:  [id number]   cc:   Melvyn Novas, MD  Fax: [phone]

## 2011-06-06 ENCOUNTER — Other Ambulatory Visit: Payer: Self-pay | Admitting: *Deleted

## 2011-06-06 DIAGNOSIS — M25569 Pain in unspecified knee: Secondary | ICD-10-CM

## 2011-06-06 MED ORDER — ACETAMINOPHEN-CODEINE 300-30 MG PO TABS: 1.0000 | ORAL_TABLET | ORAL | Status: DC | PRN

## 2011-07-17 ENCOUNTER — Encounter: Payer: Self-pay | Admitting: Orthopedic Surgery

## 2011-07-17 ENCOUNTER — Ambulatory Visit (INDEPENDENT_AMBULATORY_CARE_PROVIDER_SITE_OTHER): Payer: Medicare Other | Admitting: Orthopedic Surgery

## 2011-07-17 DIAGNOSIS — M1710 Unilateral primary osteoarthritis, unspecified knee: Secondary | ICD-10-CM

## 2011-07-17 DIAGNOSIS — IMO0002 Reserved for concepts with insufficient information to code with codable children: Secondary | ICD-10-CM

## 2011-07-17 NOTE — Progress Notes (Signed)
Separate x-ray report 3 views, LEFT knee reason, followup arthritis.  There is mild valgus alignment to the LEFT knee with multiple periarticular osteophytes, including patellofemoral disease.  Impression stable osteoarthritis, LEFT knee.  Followup visit. Recheck LEFT knee, status post Synvisc injections. Patient is doing well with his activities of daily living. He has modified his activities. He usually does well with occasional increased pain depending on his activity level.  Review of systems he says nothing else hurts. He had a stress test that was normal.  His exam shows a well-developed, well-nourished, male, grooming, and hygiene, are normal his gait is unsupported. His knee motion is 120, he has no tenderness, and no swelling. Strength is normal. Skin is intact. Pulses, normal temperature, normal.  Impression stable arthritis, LEFT knee.  Followup in 6 months for x-ray

## 2011-09-25 LAB — COMPREHENSIVE METABOLIC PANEL
ALT: 119 — ABNORMAL HIGH
AST: 75 — ABNORMAL HIGH
Albumin: 3.1 — ABNORMAL LOW
Alkaline Phosphatase: 99
BUN: 7
CO2: 31
Calcium: 9.2
Chloride: 91 — ABNORMAL LOW
Creatinine, Ser: 1.16
GFR calc Af Amer: >60
GFR calc non Af Amer: >60
Glucose, Bld: 117 — ABNORMAL HIGH
Potassium: 2.9 — ABNORMAL LOW
Sodium: 135
Total Bilirubin: 0.9
Total Protein: 6.9

## 2011-09-25 LAB — CBC
HCT: 39.2
Hemoglobin: 12.7 — ABNORMAL LOW
MCHC: 32.4
MCV: 82.2
Platelets: 318
RBC: 4.77
RDW: 13.3
WBC: 8.3

## 2011-09-25 LAB — URINALYSIS, ROUTINE W REFLEX MICROSCOPIC
Bilirubin Urine: NEGATIVE
Glucose, UA: NEGATIVE
Ketones, ur: NEGATIVE
Leukocytes, UA: NEGATIVE
Nitrite: NEGATIVE
Protein, ur: NEGATIVE
Specific Gravity, Urine: 1.02
Urobilinogen, UA: 0.2
pH: 6

## 2011-09-25 LAB — DIFFERENTIAL
Basophils Absolute: 0
Basophils Relative: 0
Eosinophils Absolute: 0.1
Eosinophils Relative: 1
Lymphocytes Relative: 9 — ABNORMAL LOW
Lymphs Abs: 0.8
Monocytes Absolute: 0.4
Monocytes Relative: 5
Neutro Abs: 7.1
Neutrophils Relative %: 85 — ABNORMAL HIGH

## 2011-09-25 LAB — URINE CULTURE
Colony Count: NO GROWTH
Culture: NO GROWTH

## 2011-09-25 LAB — URINE MICROSCOPIC-ADD ON

## 2011-11-19 ENCOUNTER — Encounter (HOSPITAL_COMMUNITY): Payer: Self-pay | Admitting: Pharmacy Technician

## 2011-11-22 ENCOUNTER — Ambulatory Visit (HOSPITAL_COMMUNITY)
Admission: RE | Admit: 2011-11-22 | Discharge: 2011-11-22 | Disposition: A | Discharge status: Home / Self Care | Payer: Medicare Other | Source: Ambulatory Visit | Attending: Anesthesiology | Admitting: Anesthesiology

## 2011-11-22 ENCOUNTER — Ambulatory Visit: Payer: Medicare Other | Admitting: Orthopedic Surgery

## 2011-11-22 ENCOUNTER — Encounter (HOSPITAL_COMMUNITY)
Admission: RE | Admit: 2011-11-22 | Discharge: 2011-11-22 | Disposition: A | Discharge status: Home / Self Care | Payer: Medicare Other | Source: Ambulatory Visit | Attending: Orthopedic Surgery | Admitting: Orthopedic Surgery

## 2011-11-22 ENCOUNTER — Encounter (HOSPITAL_COMMUNITY): Payer: Self-pay

## 2011-11-22 DIAGNOSIS — Z01812 Encounter for preprocedural laboratory examination: Secondary | ICD-10-CM | POA: Insufficient documentation

## 2011-11-22 DIAGNOSIS — I10 Essential (primary) hypertension: Secondary | ICD-10-CM | POA: Insufficient documentation

## 2011-11-22 DIAGNOSIS — Z01818 Encounter for other preprocedural examination: Secondary | ICD-10-CM | POA: Insufficient documentation

## 2011-11-22 HISTORY — DX: Unspecified osteoarthritis, unspecified site: M19.90

## 2011-11-22 LAB — PROTIME-INR
INR: 1 (ref 0.00–1.49)
Prothrombin Time: 13.4 seconds (ref 11.6–15.2)

## 2011-11-22 LAB — SURGICAL PCR SCREEN
MRSA, PCR: NEGATIVE
Staphylococcus aureus: NEGATIVE

## 2011-11-22 LAB — APTT: aPTT: 31 seconds (ref 24–37)

## 2011-11-22 MED ORDER — MUPIROCIN 2 % EX OINT
TOPICAL_OINTMENT | CUTANEOUS | Status: AC
  Filled 2011-11-22: qty 22

## 2011-11-22 NOTE — Pre-Procedure Instructions (Signed)
20 [facility]  11/22/2011   Your procedure is scheduled on:  Wednesday, December 5TH  Report to [facility] at 11 AM.  Call this number if you have problems the morning of surgery: [phone]   Remember:   Do not eat food:After Midnight Tuesday, .  May have clear liquids: up to 4 Hours before arrival (7 AM).  Clear liquids include soda, tea, black coffee, apple or grape juice, broth.   Take these medicines the morning of surgery with A SIP OF WATER:                 NORVASC, BYSTOLIC   Do not wear jewelry, make-up or nail polish.  Do not wear lotions, powders, or perfumes. You may wear deodorant.  Do not shave 48 hours prior to surgery.  Do not bring valuables to the hospital.  Contacts, dentures or bridgework may not be worn into surgery.  Leave suitcase in the car. After surgery it may be brought to your room.  For patients admitted to the hospital, checkout time is 11:00 AM the day of discharge.   Patients discharged the day of surgery will not be allowed to drive home.  Name and phone number of your driver:  ALICE FAYE                                                                      Special Instructions: CHG Shower Use Special Wash: 1/2 bottle night before surgery and 1/2 bottle morning of surgery.   Please read over the following fact sheets that you were given: Pain Booklet, Blood Transfusion Information, MRSA Information and Surgical Site Infection Prevention

## 2011-11-22 NOTE — Progress Notes (Signed)
THIS PATIENT JUST HAD A EKG, ECHO AND LAB WORK DONE YESTERDAY 11/29 AT DR. [facility] IN [place]...Marland KitchenMarland KitchenHE IS TO FOLLOW UP WITH HIM ON Tuesday TO RECEIVED 'MEDICAL   CLEARANCE'.....I HAVE REQUESTED COPIES OF EKG, ECHO & LABS.  SINCE HE HAD SOME LABS  OVER THERE, I HAVE ONLY REQUESTED A PT & PTT WITH A PCXR HERE..& PCR.....   THERE WERE NO PAT ORDERS FOR THIS PT, SO I AD LIBBED......   T & S WILL BE DRAWN DOS...HE DID NOT WANT TO WEAR IT FOR 5-6 DAYS...Marland KitchenMarland KitchenHE UNDERSTANDS   HE WILL HAVE TO HAVE ANOTHER SAMPLE DRAWN DOS.Marland KitchenMarland KitchenMarland Kitchen

## 2011-11-23 LAB — URINE CULTURE
Colony Count: NO GROWTH
Culture  Setup Time: 201211291312
Culture: NO GROWTH

## 2011-11-27 NOTE — H&P (Signed)
  [facility] [address]   [address] [place], [place] [address] [phone] A Division of [facility]  Loreta Ave, M.D.     Robert A. Thurston Hole, M.D.     Lunette Stands, M.D. Eulas Post, M.D.    Buford Dresser, M.D. Estell Harpin, M.D. Ralene Cork, D.O.          Genene Churn. Barry Dienes, PA-C            Kirstin A. Shepperson, PA-C [place], OPA-C   RE: Dekker, Verga   [phone]      DOB: November 06, 1934 PROGRESS NOTE: 11-20-11 77 year old black male  with history of end stage left knee degenerative joint disease returns. States symptoms are unchanged from previous visit and he wants to proceed with total knee replacement next week. We received cardiac clearance from Dr. Alanda Amass and are waiting for clearance from Dr. Janna Arch.  Current medications: Bystolic, HCTZ, Lisinopril, Amlodipine, Torvastatin, Avodart, Klor Con.  No known drug allergies. Past medical/surgical history: hypertension coronary artery disease BPH with elevated PSA hypercholesterolemia. Family history positive for hypertension arthritis. Social history: he's married and retired, denies smoking or alcohol use Review of systems: unremarkable.  EXAMINATION: Alert and oriented x3 in no acute distress. Temp 97.8 pulse 76 respirations 20 blood pressure 150/78. Height 5'8" 174 pounds. Normal cephalic atraumatic. Cervical spine unremarkable. Lungs CTA bilaterally no wheeze. Heart regular rate and rhythm S1 S2. Abdomen round non-distended. NADS x4. Soft non-tender. Left knee decreased range of motion positive crepitus positive swelling joint line tenderness ligaments stable. Bilateral calves non-tender neurovascularly intact.  Skin warm and dry.   IMPRESSION: Left knee end stage degenerative joint disease with chronic pain.  DISPOSITION: We'll proceed with left total knee replacement as scheduled. Discussed risks benefits and possible complications all  questions answered. With history of BPH and elevated PSA we'll plan to pull his Foley catheter upon arriving to the orthopedic unit post-op.   Loreta Ave, M.D.  Electronically verified by Loreta Ave, M.D. DFM(JMO):kh D 11-20-11 T 11-21-11

## 2011-11-28 ENCOUNTER — Inpatient Hospital Stay (HOSPITAL_COMMUNITY): Payer: Medicare Other | Admitting: Anesthesiology

## 2011-11-28 ENCOUNTER — Encounter (HOSPITAL_COMMUNITY): Payer: Self-pay | Admitting: Anesthesiology

## 2011-11-28 ENCOUNTER — Inpatient Hospital Stay (HOSPITAL_COMMUNITY): Payer: Medicare Other

## 2011-11-28 ENCOUNTER — Encounter (HOSPITAL_COMMUNITY)
Admission: RE | Disposition: A | Discharge status: Home Health | Payer: Self-pay | Source: Ambulatory Visit | Attending: Orthopedic Surgery

## 2011-11-28 ENCOUNTER — Encounter (HOSPITAL_COMMUNITY): Payer: Self-pay | Admitting: Orthopedic Surgery

## 2011-11-28 ENCOUNTER — Inpatient Hospital Stay (HOSPITAL_COMMUNITY)
Admission: RE | Admit: 2011-11-28 | Discharge: 2011-12-01 | DRG: 470 | Disposition: A | Discharge status: Home Health | Payer: Medicare Other | Source: Ambulatory Visit | Attending: Orthopedic Surgery | Admitting: Orthopedic Surgery

## 2011-11-28 DIAGNOSIS — M1710 Unilateral primary osteoarthritis, unspecified knee: Principal | ICD-10-CM | POA: Diagnosis present

## 2011-11-28 DIAGNOSIS — D62 Acute posthemorrhagic anemia: Secondary | ICD-10-CM | POA: Diagnosis not present

## 2011-11-28 DIAGNOSIS — Z23 Encounter for immunization: Secondary | ICD-10-CM

## 2011-11-28 DIAGNOSIS — N400 Enlarged prostate without lower urinary tract symptoms: Secondary | ICD-10-CM | POA: Diagnosis present

## 2011-11-28 DIAGNOSIS — E7800 Pure hypercholesterolemia, unspecified: Secondary | ICD-10-CM | POA: Diagnosis present

## 2011-11-28 DIAGNOSIS — R972 Elevated prostate specific antigen [PSA]: Secondary | ICD-10-CM | POA: Diagnosis present

## 2011-11-28 DIAGNOSIS — G8929 Other chronic pain: Secondary | ICD-10-CM | POA: Diagnosis present

## 2011-11-28 DIAGNOSIS — I10 Essential (primary) hypertension: Secondary | ICD-10-CM | POA: Diagnosis present

## 2011-11-28 DIAGNOSIS — Z4789 Encounter for other orthopedic aftercare: Secondary | ICD-10-CM

## 2011-11-28 DIAGNOSIS — I2510 Atherosclerotic heart disease of native coronary artery without angina pectoris: Secondary | ICD-10-CM | POA: Diagnosis present

## 2011-11-28 HISTORY — PX: TOTAL KNEE ARTHROPLASTY: SHX125

## 2011-11-28 LAB — TYPE AND SCREEN
ABO/RH(D): O POS
Antibody Screen: NEGATIVE

## 2011-11-28 LAB — ABO/RH: ABO/RH(D): O POS

## 2011-11-28 SURGERY — ARTHROPLASTY, KNEE, TOTAL
Anesthesia: Regional | Site: Knee | Laterality: Left | Wound class: Clean

## 2011-11-28 MED ORDER — CEFAZOLIN SODIUM-DEXTROSE 2-3 GM-% IV SOLR
2.0000 g | INTRAVENOUS | Status: AC
  Administered 2011-11-28: 2 g via INTRAVENOUS
  Filled 2011-11-28: qty 50

## 2011-11-28 MED ORDER — ATROPINE SULFATE 0.4 MG/ML IJ SOLN
INTRAMUSCULAR | Status: DC | PRN
  Administered 2011-11-28: 0.2 mg via INTRAVENOUS

## 2011-11-28 MED ORDER — DIPHENHYDRAMINE HCL 12.5 MG/5ML PO ELIX
12.5000 mg | ORAL_SOLUTION | Freq: Four times a day (QID) | ORAL | Status: DC | PRN
  Filled 2011-11-28: qty 5

## 2011-11-28 MED ORDER — ONDANSETRON HCL 4 MG/2ML IJ SOLN: 4.0000 mg | Freq: Once | INTRAMUSCULAR | Status: DC | PRN

## 2011-11-28 MED ORDER — ONDANSETRON HCL 4 MG PO TABS: 4.0000 mg | ORAL_TABLET | Freq: Four times a day (QID) | ORAL | Status: DC | PRN

## 2011-11-28 MED ORDER — ENOXAPARIN SODIUM 30 MG/0.3ML ~~LOC~~ SOLN
30.0000 mg | Freq: Two times a day (BID) | SUBCUTANEOUS | Status: DC
  Administered 2011-11-29 – 2011-11-30 (×3): 30 mg via SUBCUTANEOUS
  Filled 2011-11-28 (×4): qty 0.3

## 2011-11-28 MED ORDER — EPHEDRINE SULFATE 50 MG/ML IJ SOLN
INTRAMUSCULAR | Status: DC | PRN
  Administered 2011-11-28 (×3): 10 mg via INTRAVENOUS

## 2011-11-28 MED ORDER — ACETAMINOPHEN 325 MG PO TABS: 650.0000 mg | ORAL_TABLET | Freq: Four times a day (QID) | ORAL | Status: DC | PRN

## 2011-11-28 MED ORDER — HYDROMORPHONE HCL PF 1 MG/ML IJ SOLN: 0.2500 mg | INTRAMUSCULAR | Status: DC | PRN

## 2011-11-28 MED ORDER — WARFARIN VIDEO
Freq: Once | Status: AC
  Administered 2011-11-28: 23:00:00

## 2011-11-28 MED ORDER — LISINOPRIL 20 MG PO TABS
20.0000 mg | ORAL_TABLET | Freq: Every day | ORAL | Status: DC
  Administered 2011-11-28 – 2011-12-01 (×3): 20 mg via ORAL
  Filled 2011-11-28 (×4): qty 1

## 2011-11-28 MED ORDER — DUTASTERIDE 0.5 MG PO CAPS
0.5000 mg | ORAL_CAPSULE | Freq: Every day | ORAL | Status: DC
  Administered 2011-11-28 – 2011-12-01 (×4): 0.5 mg via ORAL
  Filled 2011-11-28 (×4): qty 1

## 2011-11-28 MED ORDER — BUPIVACAINE-EPINEPHRINE PF 0.5-1:200000 % IJ SOLN
INTRAMUSCULAR | Status: DC | PRN
  Administered 2011-11-28: 20 mL

## 2011-11-28 MED ORDER — SODIUM CHLORIDE 0.9 % IJ SOLN: 9.0000 mL | INTRAMUSCULAR | Status: DC | PRN

## 2011-11-28 MED ORDER — PROPOFOL 10 MG/ML IV EMUL
INTRAVENOUS | Status: DC | PRN
  Administered 2011-11-28: 200 mg via INTRAVENOUS

## 2011-11-28 MED ORDER — FENTANYL CITRATE 0.05 MG/ML IJ SOLN
INTRAMUSCULAR | Status: DC | PRN
  Administered 2011-11-28 (×2): 50 ug via INTRAVENOUS
  Administered 2011-11-28: 150 ug via INTRAVENOUS

## 2011-11-28 MED ORDER — NALOXONE HCL 0.4 MG/ML IJ SOLN: 0.4000 mg | INTRAMUSCULAR | Status: DC | PRN

## 2011-11-28 MED ORDER — FENTANYL CITRATE 0.05 MG/ML IJ SOLN
INTRAMUSCULAR | Status: AC
  Filled 2011-11-28: qty 2

## 2011-11-28 MED ORDER — CEFAZOLIN SODIUM 1-5 GM-% IV SOLN
1.0000 g | Freq: Four times a day (QID) | INTRAVENOUS | Status: AC
  Administered 2011-11-28 – 2011-11-29 (×3): 1 g via INTRAVENOUS
  Filled 2011-11-28 (×3): qty 50

## 2011-11-28 MED ORDER — AMLODIPINE BESYLATE 5 MG PO TABS
5.0000 mg | ORAL_TABLET | Freq: Every day | ORAL | Status: DC
Start: 2011-11-28 — End: 2011-12-01
  Administered 2011-11-30 – 2011-12-01 (×2): 5 mg via ORAL
  Filled 2011-11-28 (×4): qty 1

## 2011-11-28 MED ORDER — MENTHOL 3 MG MT LOZG: 1.0000 | LOZENGE | OROMUCOSAL | Status: DC | PRN

## 2011-11-28 MED ORDER — MORPHINE SULFATE (PF) 1 MG/ML IV SOLN
INTRAVENOUS | Status: DC
  Administered 2011-11-28: 8 mg via INTRAVENOUS
  Administered 2011-11-28: 17:00:00 via INTRAVENOUS
  Administered 2011-11-28: 5 mg via INTRAVENOUS
  Administered 2011-11-28: 4 mg via INTRAVENOUS
  Administered 2011-11-29: 10.39 mg via INTRAVENOUS
  Filled 2011-11-28: qty 25

## 2011-11-28 MED ORDER — MORPHINE SULFATE 4 MG/ML IJ SOLN
INTRAMUSCULAR | Status: DC | PRN
  Administered 2011-11-28: 4 mg

## 2011-11-28 MED ORDER — POTASSIUM CHLORIDE IN NACL 20-0.45 MEQ/L-% IV SOLN
INTRAVENOUS | Status: DC
  Administered 2011-11-29: 05:00:00 via INTRAVENOUS
  Filled 2011-11-28 (×7): qty 1000

## 2011-11-28 MED ORDER — GLYCOPYRROLATE 0.2 MG/ML IJ SOLN
INTRAMUSCULAR | Status: DC | PRN
  Administered 2011-11-28: .8 mg via INTRAVENOUS

## 2011-11-28 MED ORDER — SODIUM CHLORIDE 0.9 % IR SOLN
Status: DC | PRN
  Administered 2011-11-28: 3000 mL

## 2011-11-28 MED ORDER — ONDANSETRON HCL 4 MG/2ML IJ SOLN
INTRAMUSCULAR | Status: DC | PRN
  Administered 2011-11-28: 4 mg via INTRAVENOUS

## 2011-11-28 MED ORDER — ONDANSETRON HCL 4 MG/2ML IJ SOLN: 4.0000 mg | Freq: Four times a day (QID) | INTRAMUSCULAR | Status: DC | PRN

## 2011-11-28 MED ORDER — METHOCARBAMOL 100 MG/ML IJ SOLN
500.0000 mg | Freq: Four times a day (QID) | INTRAVENOUS | Status: DC | PRN
  Filled 2011-11-28: qty 5

## 2011-11-28 MED ORDER — SIMVASTATIN 40 MG PO TABS
40.0000 mg | ORAL_TABLET | Freq: Every day | ORAL | Status: DC
  Administered 2011-11-28: 40 mg via ORAL
  Filled 2011-11-28 (×4): qty 1

## 2011-11-28 MED ORDER — PATIENT'S GUIDE TO USING COUMADIN BOOK
Freq: Once | Status: AC
  Administered 2011-11-28: 23:00:00
  Filled 2011-11-28: qty 1

## 2011-11-28 MED ORDER — WARFARIN SODIUM 7.5 MG PO TABS
7.5000 mg | ORAL_TABLET | Freq: Once | ORAL | Status: AC
  Administered 2011-11-29: 7.5 mg via ORAL
  Filled 2011-11-28: qty 1

## 2011-11-28 MED ORDER — CHLORHEXIDINE GLUCONATE 4 % EX LIQD: 60.0000 mL | Freq: Once | CUTANEOUS | Status: DC

## 2011-11-28 MED ORDER — NEBIVOLOL HCL 10 MG PO TABS
10.0000 mg | ORAL_TABLET | Freq: Every day | ORAL | Status: DC
  Administered 2011-11-30 – 2011-12-01 (×2): 10 mg via ORAL
  Filled 2011-11-28 (×4): qty 1

## 2011-11-28 MED ORDER — PHENYLEPHRINE HCL 10 MG/ML IJ SOLN
INTRAMUSCULAR | Status: DC | PRN
  Administered 2011-11-28 (×2): 160 ug via INTRAVENOUS

## 2011-11-28 MED ORDER — OXYCODONE-ACETAMINOPHEN 5-325 MG PO TABS
1.0000 | ORAL_TABLET | ORAL | Status: DC | PRN
  Administered 2011-11-29 – 2011-12-01 (×8): 2 via ORAL
  Filled 2011-11-28 (×8): qty 2

## 2011-11-28 MED ORDER — BUPIVACAINE HCL (PF) 0.25 % IJ SOLN
INTRAMUSCULAR | Status: DC | PRN
  Administered 2011-11-28: 30 mL via INTRA_ARTICULAR

## 2011-11-28 MED ORDER — PHENOL 1.4 % MT LIQD
1.0000 | OROMUCOSAL | Status: DC | PRN
  Filled 2011-11-28: qty 177

## 2011-11-28 MED ORDER — TRIAMTERENE-HCTZ 37.5-25 MG PO TABS
1.0000 | ORAL_TABLET | Freq: Every day | ORAL | Status: DC
  Administered 2011-12-01: 1 via ORAL
  Filled 2011-11-28 (×4): qty 1

## 2011-11-28 MED ORDER — DIPHENHYDRAMINE HCL 50 MG/ML IJ SOLN: 12.5000 mg | Freq: Four times a day (QID) | INTRAMUSCULAR | Status: DC | PRN

## 2011-11-28 MED ORDER — INFLUENZA VIRUS VACC SPLIT PF IM SUSP
0.5000 mL | INTRAMUSCULAR | Status: AC
  Administered 2011-11-29: 0.5 mL via INTRAMUSCULAR
  Filled 2011-11-28: qty 0.5

## 2011-11-28 MED ORDER — VECURONIUM BROMIDE 10 MG IV SOLR
INTRAVENOUS | Status: DC | PRN
  Administered 2011-11-28: 8 mg via INTRAVENOUS

## 2011-11-28 MED ORDER — METHOCARBAMOL 500 MG PO TABS
500.0000 mg | ORAL_TABLET | Freq: Four times a day (QID) | ORAL | Status: DC | PRN
  Administered 2011-11-29 – 2011-12-01 (×5): 500 mg via ORAL
  Filled 2011-11-28 (×5): qty 1

## 2011-11-28 MED ORDER — LACTATED RINGERS IV SOLN
INTRAVENOUS | Status: DC
  Administered 2011-11-28 (×2): via INTRAVENOUS

## 2011-11-28 MED ORDER — ACETAMINOPHEN 650 MG RE SUPP: 650.0000 mg | Freq: Four times a day (QID) | RECTAL | Status: DC | PRN

## 2011-11-28 MED ORDER — FENTANYL CITRATE 0.05 MG/ML IJ SOLN
50.0000 ug | INTRAMUSCULAR | Status: DC | PRN
  Administered 2011-11-28: 100 ug via INTRAVENOUS

## 2011-11-28 MED ORDER — METHOCARBAMOL 100 MG/ML IJ SOLN
500.0000 mg | INTRAVENOUS | Status: AC
  Administered 2011-11-28: 500 mg via INTRAVENOUS
  Filled 2011-11-28: qty 5

## 2011-11-28 MED ORDER — NEOSTIGMINE METHYLSULFATE 1 MG/ML IJ SOLN
INTRAMUSCULAR | Status: DC | PRN
  Administered 2011-11-28: 4 mg via INTRAVENOUS

## 2011-11-28 SURGICAL SUPPLY — 58 items
BANDAGE ELASTIC 6 VELCRO ST LF (GAUZE/BANDAGES/DRESSINGS) ×1 IMPLANT
BANDAGE ESMARK 6X9 LF (GAUZE/BANDAGES/DRESSINGS) ×1 IMPLANT
BLADE SAG 18X100X1.27 (BLADE) ×5 IMPLANT
BNDG CMPR 9X6 STRL LF SNTH (GAUZE/BANDAGES/DRESSINGS) ×1
BNDG ESMARK 6X9 LF (GAUZE/BANDAGES/DRESSINGS) ×2
BOOTCOVER CLEANROOM LRG (PROTECTIVE WEAR) ×4 IMPLANT
BOWL SMART MIX CTS (DISPOSABLE) ×2 IMPLANT
CEMENT BONE SIMPLEX SPEEDSET (Cement) ×4 IMPLANT
CLOTH BEACON ORANGE TIMEOUT ST (SAFETY) ×2 IMPLANT
COVER BACK TABLE 24X17X13 BIG (DRAPES) ×1 IMPLANT
COVER SURGICAL LIGHT HANDLE (MISCELLANEOUS) ×2 IMPLANT
CUFF TOURNIQUET SINGLE 34IN LL (TOURNIQUET CUFF) ×2 IMPLANT
DRAPE EXTREMITY T 121X128X90 (DRAPE) ×2 IMPLANT
DRAPE PROXIMA HALF (DRAPES) ×2 IMPLANT
DRAPE U-SHAPE 47X51 STRL (DRAPES) ×2 IMPLANT
DRSG PAD ABDOMINAL 8X10 ST (GAUZE/BANDAGES/DRESSINGS) ×2 IMPLANT
DURAPREP 26ML APPLICATOR (WOUND CARE) ×2 IMPLANT
ELECT CAUTERY BLADE 6.4 (BLADE) ×2 IMPLANT
ELECT REM PT RETURN 9FT ADLT (ELECTROSURGICAL) ×2
ELECTRODE REM PT RTRN 9FT ADLT (ELECTROSURGICAL) ×1 IMPLANT
EVACUATOR 1/8 PVC DRAIN (DRAIN) ×2 IMPLANT
FACESHIELD LNG OPTICON STERILE (SAFETY) ×2 IMPLANT
FEMORAL PEG DISTAL FIXATION (Orthopedic Implant) IMPLANT
GAUZE XEROFORM 5X9 LF (GAUZE/BANDAGES/DRESSINGS) ×2 IMPLANT
GLOVE BIOGEL PI IND STRL 8 (GLOVE) ×1 IMPLANT
GLOVE BIOGEL PI INDICATOR 8 (GLOVE) ×2
GOWN PREVENTION PLUS XLARGE (GOWN DISPOSABLE) ×3 IMPLANT
GOWN STRL NON-REIN LRG LVL3 (GOWN DISPOSABLE) ×3 IMPLANT
GOWN STRL REIN 2XL LVL4 (GOWN DISPOSABLE) ×2 IMPLANT
HANDPIECE INTERPULSE COAX TIP (DISPOSABLE) ×2
IMMOBILIZER KNEE 22 UNIV (SOFTGOODS) ×2 IMPLANT
IMMOBILIZER KNEE 24 THIGH 36 (MISCELLANEOUS) IMPLANT
IMMOBILIZER KNEE 24 UNIV (MISCELLANEOUS) ×2
KIT BASIN OR (CUSTOM PROCEDURE TRAY) ×2 IMPLANT
KIT ROOM TURNOVER OR (KITS) ×2 IMPLANT
MANIFOLD NEPTUNE II (INSTRUMENTS) ×2 IMPLANT
NS IRRIG 1000ML POUR BTL (IV SOLUTION) ×2 IMPLANT
PACK TOTAL JOINT (CUSTOM PROCEDURE TRAY) ×2 IMPLANT
PAD ARMBOARD 7.5X6 YLW CONV (MISCELLANEOUS) ×4 IMPLANT
PAD CAST 4YDX4 CTTN HI CHSV (CAST SUPPLIES) ×1 IMPLANT
PADDING CAST COTTON 4X4 STRL (CAST SUPPLIES)
PADDING CAST COTTON 6X4 STRL (CAST SUPPLIES) ×2 IMPLANT
PADDING WEBRIL 6 STERILE (GAUZE/BANDAGES/DRESSINGS) ×1 IMPLANT
RUBBERBAND STERILE (MISCELLANEOUS) ×2 IMPLANT
SET HNDPC FAN SPRY TIP SCT (DISPOSABLE) ×1 IMPLANT
SPONGE GAUZE 4X4 12PLY (GAUZE/BANDAGES/DRESSINGS) ×2 IMPLANT
STAPLER VISISTAT 35W (STAPLE) ×2 IMPLANT
SUCTION FRAZIER TIP 10 FR DISP (SUCTIONS) ×2 IMPLANT
SUT VIC AB 1 CTX 36 (SUTURE) ×4
SUT VIC AB 1 CTX36XBRD ANBCTR (SUTURE) ×2 IMPLANT
SUT VIC AB 2-0 CT1 27 (SUTURE) ×4
SUT VIC AB 2-0 CT1 TAPERPNT 27 (SUTURE) ×2 IMPLANT
SYR 30ML LL (SYRINGE) ×1 IMPLANT
SYR 30ML SLIP (SYRINGE) ×2 IMPLANT
TOWEL OR 17X24 6PK STRL BLUE (TOWEL DISPOSABLE) ×2 IMPLANT
TOWEL OR 17X26 10 PK STRL BLUE (TOWEL DISPOSABLE) ×2 IMPLANT
TRAY FOLEY CATH 14FR (SET/KITS/TRAYS/PACK) ×2 IMPLANT
WATER STERILE IRR 1000ML POUR (IV SOLUTION) ×5 IMPLANT

## 2011-11-28 NOTE — Anesthesia Postprocedure Evaluation (Signed)
Anesthesia Post Note  Patient: Karl Howell  Procedure(s) Performed:  TOTAL KNEE ARTHROPLASTY  Anesthesia type: General  Patient location: PACU  Post pain: Pain level controlled and Adequate analgesia  Post assessment: Post-op Vital signs reviewed, Patient's Cardiovascular Status Stable, Respiratory Function Stable, Patent Airway and Pain level controlled  Last Vitals:  Filed Vitals:   11/28/11 1545  BP: 145/72  Pulse: 86  Temp:   Resp: 20    Post vital signs: Reviewed and stable  Level of consciousness: awake, alert  and oriented  Complications: No apparent anesthesia complications

## 2011-11-28 NOTE — Progress Notes (Signed)
ANTICOAGULATION CONSULT NOTE - Initial Consult  Pharmacy Consult for coumadin Indication: VTE prophylaxis  No Known Allergies  Patient Measurements:   Adjusted Body Weight:   Vital Signs: Temp: 98.3 F (36.8 C) (12/05 2159) Temp src: Oral (12/05 2159) BP: 130/60 mmHg (12/05 2159) Pulse Rate: 67  (12/05 2159)  Labs: No results found for this basename: HGB:2,HCT:3,PLT:3,APTT:3,LABPROT:3,INR:3,HEPARINUNFRC:3,CREATININE:3,CKTOTAL:3,CKMB:3,TROPONINI:3 in the last 72 hours CrCl is unknown because there is no height on file for the current visit.  Medical History: Past Medical History  Diagnosis Date  . HTN (hypertension)   . Arthritis     Medications:  Scheduled:    . amLODipine  5 mg Oral Daily  . ceFAZolin (ANCEF) IV  1 g Intravenous Q6H  . ceFAZolin (ANCEF) IV  2 g Intravenous 60 min Pre-Op  . dutasteride  0.5 mg Oral Daily  . enoxaparin  30 mg Subcutaneous Q12H  . influenza  inactive virus vaccine  0.5 mL Intramuscular Tomorrow-1000  . lisinopril  20 mg Oral Daily  . methocarbamol(ROBAXIN) IV  500 mg Intravenous To PACU  . morphine   Intravenous Q4H  . nebivolol  10 mg Oral Daily  . patient's guide to using coumadin book   Does not apply Once  . simvastatin  40 mg Oral QHS  . triamterene-hydrochlorothiazide  1 each Oral Daily  . warfarin  7.5 mg Oral Once  . warfarin   Does not apply Once  . DISCONTD: chlorhexidine  60 mL Topical Once    Assessment: 77 yr old male admitted for total knee replacement now to have coumadin for VTE prophylaxis following surgery. Goal of Therapy:  INR 2-3   Plan:  Will give coumadin 7.5 mg tonight. INR daily. Ordered coumadin booklet and video for patient.  Karl Howell 11/28/2011,11:02 PM

## 2011-11-28 NOTE — Anesthesia Preprocedure Evaluation (Addendum)
Anesthesia Evaluation  Patient identified by MRN, date of birth, ID band Patient awake    Reviewed: Allergy & Precautions, H&P , NPO status , Patient's Chart, lab work & pertinent test results, reviewed documented beta blocker date and time   Airway Mallampati: I TM Distance: >3 FB Neck ROM: full    Dental  (+) Teeth Intact   Pulmonary    Pulmonary exam normal       Cardiovascular Exercise Tolerance: Good hypertension, regular Normal    Neuro/Psych Negative Neurological ROS  Negative Psych ROS   GI/Hepatic negative GI ROS, Neg liver ROS,   Endo/Other  Negative Endocrine ROS  Renal/GU negative Renal ROS  Genitourinary negative   Musculoskeletal   Abdominal   Peds  Hematology negative hematology ROS (+)   Anesthesia Other Findings   Reproductive/Obstetrics                          Anesthesia Physical Anesthesia Plan  ASA: III  Anesthesia Plan: General   Post-op Pain Management: MAC Combined w/ Regional for Post-op pain   Induction: Intravenous  Airway Management Planned: Oral ETT  Additional Equipment:   Intra-op Plan:   Post-operative Plan: Extubation in OR  Informed Consent: I have reviewed the patients History and Physical, chart, labs and discussed the procedure including the risks, benefits and alternatives for the proposed anesthesia with the patient or authorized representative who has indicated his/her understanding and acceptance.     Plan Discussed with: Anesthesiologist, CRNA and Surgeon  Anesthesia Plan Comments:         Anesthesia Quick Evaluation

## 2011-11-28 NOTE — Brief Op Note (Signed)
11/28/2011  3:20 PM  PATIENT:  Karl Howell  77 y.o. male  PRE-OPERATIVE DIAGNOSIS:  Degenerative joint disease, left knee  POST-OPERATIVE DIAGNOSIS:  Degenerative joint disease, left knee  PROCEDURE:  Procedure(s): Left TOTAL KNEE ARTHROPLASTY  SURGEON:  Surgeon(s): Loreta Ave, MD  PHYSICIAN ASSISTANT: Zonia Kief M     ANESTHESIA:   general  EBL:  Total I/O In: -  Out: 100 [Urine:100]  BLOOD ADMINISTERED:none  Drain:  hemovac  SPECIMEN:  No Specimen  DISPOSITION OF SPECIMEN:  N/A  COUNTS:  YES  TOURNIQUET:   Total Tourniquet Time Documented: Thigh (Left) - 97 minutes   PATIENT DISPOSITION:  PACU - hemodynamically stable.   Delay start of Pharmacological VTE agent (>24hrs) due to surgical blood loss or risk of bleeding:  {YES/NO/NOT APPLICABLE:20182

## 2011-11-28 NOTE — Anesthesia Procedure Notes (Addendum)
Anesthesia Regional Block:  Femoral nerve block  Pre-Anesthetic Checklist: ,, timeout performed, Correct Patient, Correct Site, Correct Laterality, Correct Procedure, Correct Position, site marked, Risks and benefits discussed,  Surgical consent,  Pre-op evaluation,  At surgeon's request and post-op pain management  Laterality: Left  Prep: chloraprep       Needles:  Injection technique: Single-shot  Needle Type: Stimulator Needle - 80        Needle insertion depth: 7 cm   Additional Needles:  Procedures: nerve stimulator Femoral nerve block  Nerve Stimulator or Paresthesia:  Response: 0.5 mA, 0.1 ms,   Additional Responses:   Narrative:  Start time: 11/28/2011 12:14 PM End time: 11/28/2011 12:19 PM Injection made incrementally with aspirations every 5 mL.  Performed by: Personally  Anesthesiologist: Maren Beach MD  Additional Notes: 20cc 0.5% Marcaine w/o difficulty or discomfort   GES   Procedure Name: Intubation Date/Time: 11/28/2011 1:25 PM Performed by: Carmela Rima Pre-anesthesia Checklist: Patient identified, Timeout performed, Emergency Drugs available, Suction available and Patient being monitored Patient Re-evaluated:Patient Re-evaluated prior to inductionOxygen Delivery Method: Circle System Utilized Preoxygenation: Pre-oxygenation with 100% oxygen Intubation Type: IV induction Ventilation: Mask ventilation without difficulty Laryngoscope Size: Mac and 4 Grade View: Grade I Tube type: Oral Tube size: 7.5 mm Number of attempts: 1 Placement Confirmation: ETT inserted through vocal cords under direct vision,  breath sounds checked- equal and bilateral,  positive ETCO2 and CO2 detector Secured at: 23 cm Tube secured with: Tape Dental Injury: Teeth and Oropharynx as per pre-operative assessment

## 2011-11-28 NOTE — Transfer of Care (Signed)
Immediate Anesthesia Transfer of Care Note  Patient: Karl Howell  Procedure(s) Performed:  TOTAL KNEE ARTHROPLASTY  Patient Location: PACU  Anesthesia Type: General  Level of Consciousness: awake, alert  and oriented  Airway & Oxygen Therapy: Patient Spontanous Breathing and Patient connected to face mask oxygen  Post-op Assessment: Report given to PACU RN and Post -op Vital signs reviewed and stable  Post vital signs: Reviewed and stable  Complications: No apparent anesthesia complications

## 2011-11-28 NOTE — Progress Notes (Signed)
Mr.Burleson's wife marked the leg that is not to be operated on with a marking pen.

## 2011-11-28 NOTE — Interval H&P Note (Signed)
History and Physical Interval Note:  11/28/2011 8:39 AM  Karl Howell  has presented today for surgery, with the diagnosis of djd left knee  The various methods of treatment have been discussed with the patient and family. After consideration of risks, benefits and other options for treatment, the patient has consented to  Procedure(s): TOTAL KNEE ARTHROPLASTY as a surgical intervention .  The patients' history has been reviewed, patient examined, no change in status, stable for surgery.  I have reviewed the patients' chart and labs.  Questions were answered to the patient's satisfaction.     Rennie Rouch F

## 2011-11-29 ENCOUNTER — Encounter (HOSPITAL_COMMUNITY): Payer: Self-pay | Admitting: Orthopedic Surgery

## 2011-11-29 LAB — PROTIME-INR
INR: 1.15 (ref 0.00–1.49)
Prothrombin Time: 14.9 seconds (ref 11.6–15.2)

## 2011-11-29 LAB — BASIC METABOLIC PANEL
BUN: 12 mg/dL (ref 6–23)
CO2: 28 mEq/L (ref 19–32)
Calcium: 8.4 mg/dL (ref 8.4–10.5)
Chloride: 99 mEq/L (ref 96–112)
Creatinine, Ser: 0.96 mg/dL (ref 0.50–1.35)
GFR calc Af Amer: >90 mL/min (ref >90)
GFR calc non Af Amer: 79 mL/min — ABNORMAL LOW (ref >90)
Glucose, Bld: 134 mg/dL — ABNORMAL HIGH (ref 70–99)
Potassium: 3.1 mEq/L — ABNORMAL LOW (ref 3.5–5.1)
Sodium: 137 mEq/L (ref 135–145)

## 2011-11-29 LAB — CBC
HCT: 33.1 % — ABNORMAL LOW (ref 39.0–52.0)
Hemoglobin: 10.7 g/dL — ABNORMAL LOW (ref 13.0–17.0)
MCH: 26.9 pg (ref 26.0–34.0)
MCHC: 32.3 g/dL (ref 30.0–36.0)
MCV: 83.2 fL (ref 78.0–100.0)
Platelets: 121 10*3/uL — ABNORMAL LOW (ref 150–400)
RBC: 3.98 MIL/uL — ABNORMAL LOW (ref 4.22–5.81)
RDW: 13.5 % (ref 11.5–15.5)
WBC: 5.9 10*3/uL (ref 4.0–10.5)

## 2011-11-29 MED ORDER — WARFARIN SODIUM 7.5 MG PO TABS
7.5000 mg | ORAL_TABLET | Freq: Once | ORAL | Status: AC
  Administered 2011-11-29: 7.5 mg via ORAL
  Filled 2011-11-29: qty 1

## 2011-11-29 NOTE — Progress Notes (Signed)
Physical Therapy Evaluation Patient Details Name: Karl Howell MRN: [id number] DOB: 09-13-1934 Today's Date: 11/29/2011  Problem List:  Patient Active Problem List  Diagnoses  . DEGENERATIVE JOINT DISEASE, KNEE  . HIP PAIN  . KNEE PAIN  . SPINAL STENOSIS, LUMBAR    Past Medical History:  Past Medical History  Diagnosis Date  . HTN (hypertension)   . Arthritis    Past Surgical History:  Past Surgical History  Procedure Date  . Negative   . Cardiac catheterization     1996    "cleaned out artery out"....no problems since  . Tonsillectomy   . Coronary angioplasty     1996  ??? in [place], [place]    PT Assessment/Plan/Recommendation PT Assessment Clinical Impression Statement: Pt. is POD #1 following left TKR and has decreased functional mobility/ gait/strength/ROM.  Needs acute PT to address these areas for DC home. PT Recommendation/Assessment: Patient will need skilled PT in the acute care venue PT Problem List: Decreased strength;Decreased range of motion;Decreased activity tolerance;Decreased mobility;Decreased knowledge of use of DME;Decreased knowledge of precautions;Pain Barriers to Discharge: None PT Therapy Diagnosis : Difficulty walking;Acute pain PT Plan PT Frequency: 7X/week PT Treatment/Interventions: DME instruction;Gait training;Stair training;Functional mobility training;Therapeutic activities;Therapeutic exercise;Balance training;Patient/family education PT Recommendation Follow Up Recommendations: Home health PT Equipment Recommended:  (already delivered to home) PT Goals  Acute Rehab PT Goals PT Goal Formulation: With patient Time For Goal Achievement: 7 days Pt will go Supine/Side to Sit: with min assist PT Goal: Supine/Side to Sit - Progress: Progressing toward goal Pt will go Sit to Supine/Side: with min assist PT Goal: Sit to Supine/Side - Progress: Not met Pt will go Sit to Stand: with modified independence PT Goal: Sit to Stand - Progress:  Progressing toward goal Pt will go Stand to Sit: with modified independence PT Goal: Stand to Sit - Progress: Progressing toward goal Pt will Ambulate: 51 - 150 feet;with modified independence;with rolling walker PT Goal: Ambulate - Progress: Progressing toward goal Pt will Go Up / Down Stairs: 3-5 stairs;with least restrictive assistive device;with rail(s);with min assist PT Goal: Up/Down Stairs - Progress: Not met Pt will Perform Home Exercise Program: with min assist PT Goal: Perform Home Exercise Program - Progress: Progressing toward goal  PT Evaluation Precautions/Restrictions  Precautions Precautions: Knee Precaution Booklet Issued: Yes (comment) Precaution Comments: TKR ther ex sheet and no pillow under knee Required Braces or Orthoses: Yes Restrictions Weight Bearing Restrictions: Yes LLE Weight Bearing: Weight bearing as tolerated Prior Functioning  Home Living Lives With: Spouse Receives Help From: Family Type of Home: House Home Layout: One level Home Access: Stairs to enter Entrance Stairs-Rails: Can reach both Entrance Stairs-Number of Steps: 4 Bathroom Shower/Tub: Psychologist, counselling;Door Bathroom Toilet: Handicapped height Home Adaptive Equipment: Bedside commode/3-in-1;Walker - rolling (CPM) Prior Function Level of Independence: Independent with basic ADLs;Independent with gait;Independent with transfers Driving: Yes Vocation: Retired Producer, television/film/video: Awake/alert Overall Cognitive Status: Appears within functional limits for tasks assessed Orientation Level: Oriented X4 Sensation/Coordination Sensation Light Touch: Impaired by gross assessment (left lower leg slightly diminished) Extremity Assessment RLE Assessment RLE Assessment: Within Functional Limits LLE Assessment LLE Assessment: Exceptions to WFL LLE AROM (degrees) Left Knee Extension 0-130: -5  Left Knee Flexion 0-140: 70  LLE Strength Left Knee Extension: 2-/5 Mobility  (including Balance) Bed Mobility Bed Mobility: Yes Supine to Sit: 3: Mod assist (with pt. using strap to assist left LE movement) Supine to Sit Details (indicate cue type and reason): near constant vc's for technique and  direction Sit to Supine - Left: Not tested (comment) Transfers Transfers: Yes Sit to Stand: 3: Mod assist Sit to Stand Details (indicate cue type and reason): instruction for hand placement and technique Stand to Sit: 3: Mod assist (esp. to control descent) Stand to Sit Details: vc's for hand placement and left LE posistion Ambulation/Gait Ambulation/Gait: Yes Ambulation/Gait Assistance: 4: Min assist Ambulation/Gait Assistance Details (indicate cue type and reason): constant cues for sequence and technique Ambulation Distance (Feet): 18 Feet Assistive device: Rolling walker Gait Pattern: Step-to pattern;Decreased step length - right;Decreased step length - left (pt. with difficulty gauging proper step lenght) Stairs: No    Exercise  Total Joint Exercises Ankle Circles/Pumps: AROM;Both;10 reps Quad Sets: Strengthening;Left;10 reps (fair contraction strength) End of Session PT - End of Session Equipment Utilized During Treatment: Gait belt;Left knee immobilizer Activity Tolerance: Patient limited by fatigue;Patient limited by pain Patient left: in chair;with call bell in reach;with family/visitor present Nurse Communication: Mobility status for transfers;Mobility status for ambulation;Weight bearing status General Behavior During Session: [facility] for tasks performed (pt. appears apprehensive, needs encouragement) Cognition: WFL for tasks performed  Ferman Hamming 11/29/2011, 12:02 PM Acute Rehabilitation Services [phone] (pager)

## 2011-11-29 NOTE — Progress Notes (Signed)
Utilization review completed.  

## 2011-11-29 NOTE — Op Note (Signed)
Karl Howell, SHEILS NO.:  [id number]  MEDICAL RECORD NO.:  [id number]  LOCATION:  5011                         FACILITY:  [facility]  PHYSICIAN:  Loreta Ave, M.D. DATE OF BIRTH:  January 19, 1934  DATE OF PROCEDURE:  11/28/2011 DATE OF DISCHARGE:                              OPERATIVE REPORT   PREOPERATIVE DIAGNOSES: 1. End-stage degenerative arthritis, left knee. 2. Valgus alignment. 3. Bone loss lateral compartment.  POSTOPERATIVE DIAGNOSES: 1. End-stage degenerative arthritis, left knee. 2. Valgus alignment. 3. Bone loss lateral compartment with most of the bone loss on the     femoral condyle.  PROCEDURE PERFORMED:  Left knee modified minimally invasive total knee replacement, Stryker triathlon prosthesis.  Soft tissue balancing. Cemented pegged posterior stabilized #5 femoral component.  Cemented #6 tibial component with a 9-mm polyethylene insert.  Cemented resurfacing 38 mm patellar component.  SURGEON:  Loreta Ave, MD.  ASSISTANT:  Genene Churn. Denton Meek., present throughout the entire case necessary for timely completion of procedure.  ANESTHESIA:  General.  BLOOD LOSS:  Minimal.  SPECIMENS:  None.  CULTURES:  None.  COMPLICATIONS:  None.  DRESSINGS:  Soft compressive.  TOURNIQUET TIME:  1 hour and 10 minutes.  DRAINS:  Hemovac x1.  PROCEDURE IN DETAIL:  The patient was brought to the operating room, placed on the operating table in supine position.  After adequate anesthesia had been obtained, left knee examined.  Increased valgus partially correctable.  Still fairly good flexion and extension. Tourniquet applied.  Prepped and draped in usual sterile fashion. Exsanguinated with elevation, Esmarch.  Tourniquet inflated to 350 mmHg. Straight incision above the patella down to tibial tubercle.  Medial arthrotomy, vastus splitting, preserving quad tendon.  Hemostasis cautery.  Knee exposed.  Remnants of menisci, cruciate  ligaments, extensive spurring, loose bodies removed.  Distal femur exposed.  Bone loss mostly on the condyle distal and posterior.  Intramedullary guide placed.  A 10-mm resection, 5 degrees of valgus.  Most of the bone cut off the medial side, as would be expected.  Using epicondylar axis, sized, cut, and fitted for a posterior stabilized # 5 component.  He was noted with doing this that there was a small sagittal split in the femur next to the box cut.  This was very stable and was contained with the PEG on the femoral components.  I did not need to add any other fixation.  This occurred as a result of just impacting the trial in very hard bone despite the pre cuts I had made.  Attention turned to tibia. Extramedullary guide.  3 degree posterior slope cut.  Size for #6 component.  Debris cleared throughout the knee and all recesses. Patella exposed.  Posterior 10 mm removed.  Drilled, sized, and fitted for a 38-mm component.  After irrigation, all trials put in place.  With a 9-mm insert, I was very pleased with flexion, extension by mechanical axis, alignment, stability, balancing in flexion and extension and patellofemoral tracking.  Trials removed.  Tibials hand reamed.  I irrigated once again with pulse lavage.  Cement prepared, placed on all components which were firmly seated.  Patella held with  a clamp.  Once cement hardened, the knee was reexamined.  Again, pleased by mechanical axis.  Balancing in flexion and extension as well as patellar tracking. Wound irrigated.  Hemovac placed through a separate stab wound. Arthrotomy closed #1 Vicryl.  Skin and subcutaneous tissue with Vicryl and staples.  Sterile compressive dressing applied.  Tourniquet deflated and removed.  Knee immobilizer applied.  Anesthesia reversed.  Brought to the recovery room.  Tolerated surgery well.  No complications.     Loreta Ave, M.D.     DFM/MEDQ  D:  11/28/2011  T:  11/29/2011  Job:   [id number]

## 2011-11-29 NOTE — Progress Notes (Signed)
Subjective: Doing great.  Pain well controlled.  Objective: Vital signs in last 24 hours: Temp:  [97.3 F (36.3 C)-99.4 F (37.4 C)] 99.1 F (37.3 C) (12/06 0554) Pulse Rate:  [61-92] 83  (12/06 0554) Resp:  [16-28] 18  (12/06 0554) BP: (111-151)/(47-78) 111/47 mmHg (12/06 0554) SpO2:  [93 %-100 %] 100 % (12/06 0554)  Intake/Output from previous day: 12/05 0701 - 12/06 0700 In: 2405 [P.O.:120; I.V.:2235; IV Piggyback:50] Out: 1645 [Urine:1200; Drains:425; Blood:20] Intake/Output this shift:    No results found for this basename: HGB:5 in the last 72 hours No results found for this basename: WBC:2,RBC:2,HCT:2,PLT:2 in the last 72 hours No results found for this basename: NA:2,K:2,CL:2,CO2:2,BUN:2,CREATININE:2,GLUCOSE:2,CALCIUM:2 in the last 72 hours No results found for this basename: LABPT:2,INR:2 in the last 72 hours  Neurovascular intact Compartment soft  Assessment/Plan: Anticipate d/c home tomorrow if does well with therapy.  D/c foley and pca.   Bryker Fletchall M 11/29/2011, 7:26 AM

## 2011-11-29 NOTE — Progress Notes (Signed)
ANTICOAGULATION CONSULT NOTE - Follow Up Consult  Pharmacy Consult for Coumadin Indication: VTE prophylaxis  No Known Allergies  Patient Measurements:    Vital Signs: Temp: 99.1 F (37.3 C) (12/06 0554) Temp src: Oral (12/06 0554) BP: 111/47 mmHg (12/06 0554) Pulse Rate: 83  (12/06 0554)  Labs:  Basename 11/29/11 0605  HGB 10.7*  HCT 33.1*  PLT 121*  APTT --  LABPROT 14.9  INR 1.15  HEPARINUNFRC --  CREATININE 0.96  CKTOTAL --  CKMB --  TROPONINI --   CrCl is unknown because there is no height on file for the current visit.   Medications:  Scheduled:    . amLODipine  5 mg Oral Daily  . ceFAZolin (ANCEF) IV  1 g Intravenous Q6H  . ceFAZolin (ANCEF) IV  2 g Intravenous 60 min Pre-Op  . dutasteride  0.5 mg Oral Daily  . enoxaparin  30 mg Subcutaneous Q12H  . influenza  inactive virus vaccine  0.5 mL Intramuscular Tomorrow-1000  . lisinopril  20 mg Oral Daily  . methocarbamol(ROBAXIN) IV  500 mg Intravenous To PACU  . nebivolol  10 mg Oral Daily  . patient's guide to using coumadin book   Does not apply Once  . simvastatin  40 mg Oral QHS  . triamterene-hydrochlorothiazide  1 each Oral Daily  . warfarin  7.5 mg Oral Once  . warfarin   Does not apply Once  . DISCONTD: chlorhexidine  60 mL Topical Once  . DISCONTD: morphine   Intravenous Q4H    Assessment: 77 y/o male patient s/p TKR receiving coumadin for dvt px. Coumadin started last pm, INR subtherapeutic. No bleeding reported, on lovenox 30mg  q12h.  Goal of Therapy:  INR 2-3   Plan:  Repeat coumadin 7.5mg  today and f/u in am.  Verlene Mayer, PharmD, BCPS Pager [phone]  11/29/2011,10:44 AM

## 2011-11-30 LAB — CBC
HCT: 28 % — ABNORMAL LOW (ref 39.0–52.0)
HCT: 29.9 % — ABNORMAL LOW (ref 39.0–52.0)
Hemoglobin: 9.2 g/dL — ABNORMAL LOW (ref 13.0–17.0)
Hemoglobin: 9.7 g/dL — ABNORMAL LOW (ref 13.0–17.0)
MCH: 26.6 pg (ref 26.0–34.0)
MCH: 26.6 pg (ref 26.0–34.0)
MCHC: 32.9 g/dL (ref 30.0–36.0)
MCHC: 32.4 g/dL (ref 30.0–36.0)
MCV: 80.9 fL (ref 78.0–100.0)
MCV: 81.9 fL (ref 78.0–100.0)
Platelets: 92 10*3/uL — ABNORMAL LOW (ref 150–400)
Platelets: 88 10*3/uL — ABNORMAL LOW (ref 150–400)
RBC: 3.46 MIL/uL — ABNORMAL LOW (ref 4.22–5.81)
RBC: 3.65 MIL/uL — ABNORMAL LOW (ref 4.22–5.81)
RDW: 13.2 % (ref 11.5–15.5)
RDW: 13.3 % (ref 11.5–15.5)
WBC: 5.3 10*3/uL (ref 4.0–10.5)
WBC: 5.8 10*3/uL (ref 4.0–10.5)

## 2011-11-30 LAB — PROTIME-INR
INR: 1.48 (ref 0.00–1.49)
Prothrombin Time: 18.2 seconds — ABNORMAL HIGH (ref 11.6–15.2)

## 2011-11-30 LAB — BASIC METABOLIC PANEL
BUN: 10 mg/dL (ref 6–23)
CO2: 26 mEq/L (ref 19–32)
Calcium: 8.5 mg/dL (ref 8.4–10.5)
Chloride: 99 mEq/L (ref 96–112)
Creatinine, Ser: 0.97 mg/dL (ref 0.50–1.35)
GFR calc Af Amer: >90 mL/min (ref >90)
GFR calc non Af Amer: 79 mL/min — ABNORMAL LOW (ref >90)
Glucose, Bld: 110 mg/dL — ABNORMAL HIGH (ref 70–99)
Potassium: 2.8 mEq/L — ABNORMAL LOW (ref 3.5–5.1)
Sodium: 135 mEq/L (ref 135–145)

## 2011-11-30 MED ORDER — POTASSIUM CHLORIDE 20 MEQ PO PACK: 20.0000 meq | PACK | Freq: Every day | ORAL | Status: DC

## 2011-11-30 MED ORDER — POTASSIUM CHLORIDE CRYS ER 20 MEQ PO TBCR
40.0000 meq | EXTENDED_RELEASE_TABLET | Freq: Once | ORAL | Status: AC
  Administered 2011-11-30: 40 meq via ORAL
  Filled 2011-11-30: qty 2

## 2011-11-30 MED ORDER — ENOXAPARIN SODIUM 30 MG/0.3ML ~~LOC~~ SOLN
30.0000 mg | Freq: Two times a day (BID) | SUBCUTANEOUS | Status: DC
  Administered 2011-11-30 – 2011-12-01 (×2): 30 mg via SUBCUTANEOUS
  Filled 2011-11-30 (×3): qty 0.3

## 2011-11-30 MED ORDER — POTASSIUM CHLORIDE 20 MEQ PO PACK: 40.0000 meq | PACK | Freq: Once | ORAL | Status: DC

## 2011-11-30 MED ORDER — WARFARIN SODIUM 5 MG PO TABS
5.0000 mg | ORAL_TABLET | Freq: Once | ORAL | Status: AC
  Administered 2011-11-30: 5 mg via ORAL
  Filled 2011-11-30: qty 1

## 2011-11-30 MED ORDER — POTASSIUM CHLORIDE CRYS ER 20 MEQ PO TBCR
20.0000 meq | EXTENDED_RELEASE_TABLET | Freq: Every day | ORAL | Status: DC
  Administered 2011-11-30 – 2011-12-01 (×2): 20 meq via ORAL
  Filled 2011-11-30 (×3): qty 1

## 2011-11-30 NOTE — Progress Notes (Signed)
Occupational Therapy Evaluation Patient Details Name: Karl Howell MRN: [id number] DOB: [birth year] Today's Date: 11/30/2011  Problem List:  Patient Active Problem List  Diagnoses  . DEGENERATIVE JOINT DISEASE, KNEE  . HIP PAIN  . KNEE PAIN  . SPINAL STENOSIS, LUMBAR    Past Medical History:  Past Medical History  Diagnosis Date  . HTN (hypertension)   . Arthritis    Past Surgical History:  Past Surgical History  Procedure Date  . Negative   . Cardiac catheterization     1996    "cleaned out artery out"....no problems since  . Tonsillectomy   . Coronary angioplasty     1996  ??? in [place], [place]  . Total knee arthroplasty 11/28/2011    Procedure: TOTAL KNEE ARTHROPLASTY;  Surgeon: Loreta Ave, MD;  Location: [facility] OR;  Service: Orthopedics;  Laterality: Left;    OT Assessment/Plan/Recommendation OT Assessment Clinical Impression Statement: Pt s/p L TKR.  Pt with decreased I with ADLs and functional transfers.  Pt will benefit from acute OT to increased I with ADLs/functional transfers in prep for d/c home with wife. OT Recommendation/Assessment: Patient will need skilled OT in the acute care venue OT Problem List: Decreased knowledge of use of DME or AE;Pain OT Therapy Diagnosis : Acute pain OT Plan OT Frequency: Min 2X/week OT Treatment/Interventions: Self-care/ADL training;DME and/or AE instruction;Therapeutic activities;Patient/family education OT Recommendation Follow Up Recommendations: None Equipment Recommended: None recommended by OT Individuals Consulted Consulted and Agree with Results and Recommendations: Patient OT Goals Acute Rehab OT Goals OT Goal Formulation: With patient Time For Goal Achievement: 7 days ADL Goals Pt Will Transfer to Toilet: with modified independence;with DME;Ambulation;3-in-1 ADL Goal: Toilet Transfer - Progress: Other (comment) Pt Will Perform Tub/Shower Transfer: Shower transfer;with modified independence;with DME;Shower seat with  back ADL Goal: Web designer - Progress: Other (comment)  OT Evaluation Precautions/Restrictions  Precautions Precautions: Knee Precaution Booklet Issued: Yes (comment) Precaution Comments: TKR ther ex shhet and no pillow under knee Required Braces or Orthoses: Yes Restrictions Weight Bearing Restrictions: Yes LLE Weight Bearing: Weight bearing as tolerated Prior Functioning Home Living Lives With: Spouse Receives Help From: Family Type of Home: House Home Layout: One level Home Access: Stairs to enter Entrance Stairs-Rails: Can reach both Entrance Stairs-Number of Steps: 4 Bathroom Shower/Tub: Psychologist, counselling;Door Bathroom Toilet: Handicapped height Home Adaptive Equipment: Bedside commode/3-in-1;Walker - rolling Prior Function Level of Independence: Independent with basic ADLs;Independent with gait;Independent with transfers Driving: Yes Vocation: Retired ADL ADL Eating/Feeding: Simulated;Independent Where Assessed - Eating/Feeding: Chair Grooming: Simulated;Set up Where Assessed - Grooming: Standing at sink;Unsupported Upper Body Bathing: Simulated;Set up Where Assessed - Upper Body Bathing: Sitting, chair;Unsupported Lower Body Bathing: Simulated;Minimal assistance Where Assessed - Lower Body Bathing: Sit to stand from chair;Unsupported Upper Body Dressing: Performed;Set up Where Assessed - Upper Body Dressing: Standing;Unsupported Lower Body Dressing: Simulated;Minimal assistance Where Assessed - Lower Body Dressing: Sit to stand from chair;Unsupported Toilet Transfer: Performed;Supervision/safety Toilet Transfer Method: Proofreader: Raised toilet seat with arms (or 3-in-1 over toilet) Toileting - Clothing Manipulation: Simulated;Independent Where Assessed - Toileting Clothing Manipulation: Sit to stand from 3-in-1 or toilet Toileting - Hygiene: Simulated;Independent Where Assessed - Toileting Hygiene: Sit on 3-in-1 or  toilet Equipment Used: Rolling walker ADL Comments: Pt with decreased I with ADLs due to decreased ROM and increased pain in LLe Vision/Perception    Cognition Cognition Arousal/Alertness: Awake/alert Overall Cognitive Status: Appears within functional limits for tasks assessed Orientation Level: Oriented X4 Sensation/Coordination   Extremity Assessment RUE  Assessment RUE Assessment: Within Functional Limits LUE Assessment LUE Assessment: Within Functional Limits Mobility  Bed Mobility Bed Mobility: No Transfers Transfers: Yes Sit to Stand: 5: Supervision;From chair/3-in-1 Sit to Stand Details (indicate cue type and reason): proper technique demonstrated by pt. Stand to Sit: 4: Min assist;To chair/3-in-1 Exercises  End of Session General Behavior During Session: [facility] for tasks performed Cognition: [facility] for tasks performed   Cipriano Mile 11/30/2011, 2:57 PM  11/30/2011 Cipriano Mile OTR/L Pager [phone] Office [phone]

## 2011-11-30 NOTE — Progress Notes (Signed)
ANTICOAGULATION CONSULT NOTE - Follow Up Consult  Pharmacy Consult for lovenox bridge to coumadin Indication: VTE prophylaxis  No Known Allergies  Patient Measurements:    Vital Signs: Temp: 98.9 F (37.2 C) (12/07 0619) Temp src: Oral (12/07 0619) BP: 126/57 mmHg (12/07 0619) Pulse Rate: 93  (12/07 0619)  Labs:  Basename 11/30/11 1330 11/30/11 0710 11/29/11 0605  HGB 9.7* 9.2* --  HCT 29.9* 28.0* 33.1*  PLT 88* 92* 121*  APTT -- -- --  LABPROT -- 18.2* 14.9  INR -- 1.48 1.15  HEPARINUNFRC -- -- --  CREATININE 0.97 -- 0.96  CKTOTAL -- -- --  CKMB -- -- --  TROPONINI -- -- --   CrCl is unknown because there is no height on file for the current visit.   Medications:  Scheduled:    . amLODipine  5 mg Oral Daily  . dutasteride  0.5 mg Oral Daily  . enoxaparin  30 mg Subcutaneous Q12H  . lisinopril  20 mg Oral Daily  . nebivolol  10 mg Oral Daily  . potassium chloride  20 mEq Oral Daily  . simvastatin  40 mg Oral QHS  . triamterene-hydrochlorothiazide  1 each Oral Daily  . warfarin  7.5 mg Oral ONCE-1800  . DISCONTD: enoxaparin  30 mg Subcutaneous Q12H  . DISCONTD: potassium chloride  20 mEq Oral Daily    Assessment: 77yo post  ortho procedure with an INR of 1.48 (robust jump in 24 hours).  Patient is getting enoxaparin 30mg  q12 and coumadin 7.5mg  x 2.  Warfarin dosing score = 6.    Goal of Therapy:  INR 2-3   Plan:  Continue enoxaparin 30mg  q12 Warfarin 5mg  at 1800 Adjust dosage after INR in am.  Karl Howell 11/30/2011,2:32 PM

## 2011-11-30 NOTE — Progress Notes (Signed)
Subjective: Doing well.  Some difficulty voiding last night, but better today.   Did well with therapy.   Objective: Vital signs in last 24 hours: Temp:  [98 F (36.7 C)-98.9 F (37.2 C)] 98.9 F (37.2 C) (12/07 0619) Pulse Rate:  [72-93] 93  (12/07 0619) Resp:  [16-18] 18  (12/07 0619) BP: (108-138)/(40-73) 126/57 mmHg (12/07 0619) SpO2:  [98 %] 98 % (12/07 0619)  Intake/Output from previous day: 12/06 0701 - 12/07 0700 In: 2800 [P.O.:1780; I.V.:1020] Out: 2700 [Urine:2450; Drains:250] Intake/Output this shift:     Basename 11/30/11 0710 11/29/11 0605  HGB 9.2* 10.7*    Basename 11/30/11 0710 11/29/11 0605  WBC 5.3 5.9  RBC 3.46* 3.98*  HCT 28.0* 33.1*  PLT 92* 121*    Basename 11/29/11 0605  NA 137  K 3.1*  CL 99  CO2 28  BUN 12  CREATININE 0.96  GLUCOSE 134*  CALCIUM 8.4    Basename 11/30/11 0710 11/29/11 0605  LABPT -- --  INR 1.48 1.15    Neurovascular intact No cellulitis present Compartment soft Wound looks good.  Staples intact.  Drain removed. Assessment/Plan: bmet today.  Possible d/c home today or Saturday.     Saloma Cadena M 11/30/2011, 10:44 AM

## 2011-11-30 NOTE — Progress Notes (Signed)
Physical Therapy Treatment Patient Details Name: Karl Howell MRN: [id number] DOB: [birth year] Today's Date: 11/30/2011  PT Assessment/Plan  PT - Assessment/Plan Comments on Treatment Session: Pt. making excellent progress in all areas of PT.   PT Plan: Discharge plan remains appropriate;Frequency remains appropriate PT Frequency: 7X/week Follow Up Recommendations: Home health PT Equipment Recommended: None recommended by PT PT Goals  Acute Rehab PT Goals PT Goal: Sit to Stand - Progress: Progressing toward goal PT Goal: Stand to Sit - Progress: Progressing toward goal PT Goal: Ambulate - Progress: Progressing toward goal PT Goal: Up/Down Stairs - Progress: Progressing toward goal PT Goal: Perform Home Exercise Program - Progress: Progressing toward goal  PT Treatment Precautions/Restrictions  Precautions Precautions: Knee Precaution Booklet Issued: Yes (comment) Precaution Comments: TKR ther ex shhet and no pillow under knee Required Braces or Orthoses: Yes Restrictions Weight Bearing Restrictions: Yes LLE Weight Bearing: Weight bearing as tolerated Mobility (including Balance) Bed Mobility Bed Mobility: No (pt. up in recliner) Transfers Transfers: Yes Sit to Stand: 5: Supervision;From chair/3-in-1 Sit to Stand Details (indicate cue type and reason): proper technique demonstrated by pt. Stand to Sit: 4: Min assist;To chair/3-in-1 (for controlled descent to recliner) Ambulation/Gait Ambulation/Gait: Yes Ambulation/Gait Assistance: 5: Supervision Ambulation/Gait Assistance Details (indicate cue type and reason): occasional cues to re-establish correct sequence; cues to push rather than pick up RW; cues for correct RW distance Ambulation Distance (Feet): 200 Feet Assistive device: Rolling walker Gait Pattern: Step-to pattern;Trunk flexed Stairs: Yes Stairs Assistance: 4: Min assist (min guard assist) Stairs Assistance Details (indicate cue type and reason): cues for  technique Stair Management Technique: Two rails;Step to pattern;Forwards Number of Stairs: 3     Exercise  Total Joint Exercises Long Arc Quad: AROM;AAROM;Left;Seated (slight assist needed; -7 degree extensor lag) Knee Flexion: Seated;10 reps;AAROM (AA knee flexion 96 degrees this am) End of Session PT - End of Session Equipment Utilized During Treatment: Gait belt Activity Tolerance: Patient tolerated treatment well Patient left: in chair;with call bell in reach;with family/visitor present (wife present) General Behavior During Session: [facility] for tasks performed Cognition: [facility] for tasks performed  Ferman Hamming 11/30/2011, 12:29 PM Acute Rehabilitation Services [phone] (pager)

## 2011-12-01 LAB — CBC
HCT: 26.4 % — ABNORMAL LOW (ref 39.0–52.0)
Hemoglobin: 8.5 g/dL — ABNORMAL LOW (ref 13.0–17.0)
MCH: 26.2 pg (ref 26.0–34.0)
MCHC: 32.2 g/dL (ref 30.0–36.0)
MCV: 81.5 fL (ref 78.0–100.0)
Platelets: 82 10*3/uL — ABNORMAL LOW (ref 150–400)
RBC: 3.24 MIL/uL — ABNORMAL LOW (ref 4.22–5.81)
RDW: 13.3 % (ref 11.5–15.5)
WBC: 5.8 10*3/uL (ref 4.0–10.5)

## 2011-12-01 LAB — BASIC METABOLIC PANEL
BUN: 10 mg/dL (ref 6–23)
CO2: 28 mEq/L (ref 19–32)
Calcium: 8.4 mg/dL (ref 8.4–10.5)
Chloride: 100 mEq/L (ref 96–112)
Creatinine, Ser: 0.93 mg/dL (ref 0.50–1.35)
GFR calc Af Amer: >90 mL/min (ref >90)
GFR calc non Af Amer: 80 mL/min — ABNORMAL LOW (ref >90)
Glucose, Bld: 138 mg/dL — ABNORMAL HIGH (ref 70–99)
Potassium: 3 mEq/L — ABNORMAL LOW (ref 3.5–5.1)
Sodium: 136 mEq/L (ref 135–145)

## 2011-12-01 LAB — PROTIME-INR
INR: 1.89 — ABNORMAL HIGH (ref 0.00–1.49)
Prothrombin Time: 22 seconds — ABNORMAL HIGH (ref 11.6–15.2)

## 2011-12-01 MED ORDER — ENOXAPARIN SODIUM 30 MG/0.3ML ~~LOC~~ SOLN: 30.0000 mg | Freq: Two times a day (BID) | SUBCUTANEOUS | Status: DC

## 2011-12-01 MED ORDER — WARFARIN SODIUM 2.5 MG PO TABS
2.5000 mg | ORAL_TABLET | Freq: Once | ORAL | Status: DC
  Filled 2011-12-01: qty 1

## 2011-12-01 MED ORDER — WARFARIN SODIUM 2.5 MG PO TABS: 2.5000 mg | ORAL_TABLET | Freq: Once | ORAL | Status: DC

## 2011-12-01 NOTE — Progress Notes (Signed)
ANTICOAGULATION CONSULT NOTE - Follow Up Consult  Pharmacy Consult for lovenox bridge to coumadin Indication: VTE prophylaxis  No Known Allergies  Patient Measurements:    Vital Signs: Temp: 98.6 F (37 C) (12/08 0625) BP: 124/43 mmHg (12/08 0625) Pulse Rate: 85  (12/08 0625)  Labs:  Basename 12/01/11 0600 11/30/11 1330 11/30/11 0710 11/29/11 0605  HGB 8.5* 9.7* -- --  HCT 26.4* 29.9* 28.0* --  PLT 82* 88* 92* --  APTT -- -- -- --  LABPROT 22.0* -- 18.2* 14.9  INR 1.89* -- 1.48 1.15  HEPARINUNFRC -- -- -- --  CREATININE -- 0.97 -- 0.96  CKTOTAL -- -- -- --  CKMB -- -- -- --  TROPONINI -- -- -- --   CrCl is unknown because there is no height on file for the current visit.   Medications:  Scheduled:     . amLODipine  5 mg Oral Daily  . dutasteride  0.5 mg Oral Daily  . enoxaparin  30 mg Subcutaneous Q12H  . lisinopril  20 mg Oral Daily  . nebivolol  10 mg Oral Daily  . potassium chloride  20 mEq Oral Daily  . potassium chloride  40 mEq Oral Once  . simvastatin  40 mg Oral QHS  . triamterene-hydrochlorothiazide  1 each Oral Daily  . warfarin  5 mg Oral ONCE-1800  . DISCONTD: enoxaparin  30 mg Subcutaneous Q12H  . DISCONTD: potassium chloride  20 mEq Oral Daily  . DISCONTD: potassium chloride  40 mEq Oral Once    Assessment: 77yo post  ortho procedure with an INR of 1.86<---1.48.  Patient is getting enoxaparin 30mg  q12 and coumadin 7.5mg  x 2 f/b 5 mg x1.  Warfarin dosing score = 6.    Goal of Therapy:  INR 2-3   Plan:  Continue enoxaparin 30mg  q12 Warfarin 2.5 mg at 1800 F/U INR  Tovah Slavick, [place] R 12/01/2011,8:38 AM

## 2011-12-01 NOTE — Progress Notes (Signed)
PATIENT ID:      Karl Howell  MRN:     [id number] DOB/AGE:    75-Jul-1935 / 75 y.o.    PROGRESS NOTE Subjective:  negative for Chest Pain  negative for Shortness of Breath  negative for Nausea/Vomiting   negative for Calf Pain  negative for Bowel Movement   Tolerating Diet: yes         Patient reports pain as 2 on 0-10 scale.    Objective: Vital signs in last 24 hours:  Patient Vitals for the past 24 hrs:  BP Temp Pulse Resp SpO2  12/01/11 0625 124/43 mmHg 98.6 F (37 C) 85  16  100 %  11/30/11 2120 108/64 mmHg 99.3 F (37.4 C) 91  18  99 %  11/30/11 1400 101/66 mmHg 98.5 F (36.9 C) 82  16  99 %      Intake/Output from previous day:   12/07 0701 - 12/08 0700 In: 960 [P.O.:960] Out: 200 [Urine:200]   Intake/Output this shift:   12/08 0701 - 12/08 1900 In: 360 [P.O.:360] Out: -    Intake/Output      12/07 0701 - 12/08 0700 12/08 0701 - 12/09 0700   P.O. 960 360   I.V.     Total Intake 960 360   Urine 200    Drains     Total Output 200    Net +760 +360        Urine Occurrence 2 x       LABORATORY DATA:  Basename 12/01/11 0600 11/30/11 1330 11/30/11 0710 11/29/11 0605  WBC 5.8 5.8 5.3 5.9  HGB 8.5* 9.7* 9.2* 10.7*  HCT 26.4* 29.9* 28.0* 33.1*  PLT 82* 88* 92* 121*    Basename 12/01/11 0851 11/30/11 1330 11/29/11 0605  NA 136 135 137  K 3.0* 2.8* 3.1*  CL 100 99 99  CO2 28 26 28   BUN 10 10 12   CREATININE 0.93 0.97 0.96  GLUCOSE 138* 110* 134*  CALCIUM 8.4 8.5 8.4   Lab Results  Component Value Date   INR 1.89* 12/01/2011   INR 1.48 11/30/2011   INR 1.15 11/29/2011    Examination:  General appearance: alert, cooperative and no distress Extremities: extremities normal, atraumatic, no cyanosis or edema and Homans sign is negative, no sign of DVT  Wound Exam: clean, dry, intact   Drainage:  None: wound tissue dry  Motor Exam EHL and FHL Intact  Sensory Exam Deep Peroneal normal  Assessment:    3 Days Post-Op  Procedure(s) (LRB): TOTAL KNEE  ARTHROPLASTY (Left)  ADDITIONAL DIAGNOSIS:  Active Problems:  * No active hospital problems. *   Acute Blood Loss Anemia   Plan: Physical Therapy as ordered Weight Bearing as Tolerated (WBAT)  DVT Prophylaxis:  Lovenox and Coumadin  DISCHARGE PLAN: Home  DISCHARGE NEEDS: HHPT, HHRN, CPM, Walker and 3-in-1 comode seat         Cleo Santucci 12/01/2011, 10:03 AM

## 2011-12-26 DIAGNOSIS — M1710 Unilateral primary osteoarthritis, unspecified knee: Secondary | ICD-10-CM | POA: Diagnosis not present

## 2011-12-26 DIAGNOSIS — M25669 Stiffness of unspecified knee, not elsewhere classified: Secondary | ICD-10-CM | POA: Diagnosis not present

## 2011-12-26 DIAGNOSIS — M25569 Pain in unspecified knee: Secondary | ICD-10-CM | POA: Diagnosis not present

## 2011-12-28 DIAGNOSIS — M25569 Pain in unspecified knee: Secondary | ICD-10-CM | POA: Diagnosis not present

## 2011-12-28 DIAGNOSIS — Z471 Aftercare following joint replacement surgery: Secondary | ICD-10-CM | POA: Diagnosis not present

## 2011-12-28 DIAGNOSIS — M1710 Unilateral primary osteoarthritis, unspecified knee: Secondary | ICD-10-CM | POA: Diagnosis not present

## 2011-12-28 DIAGNOSIS — M25669 Stiffness of unspecified knee, not elsewhere classified: Secondary | ICD-10-CM | POA: Diagnosis not present

## 2011-12-31 DIAGNOSIS — M1710 Unilateral primary osteoarthritis, unspecified knee: Secondary | ICD-10-CM | POA: Diagnosis not present

## 2011-12-31 DIAGNOSIS — Z471 Aftercare following joint replacement surgery: Secondary | ICD-10-CM | POA: Diagnosis not present

## 2011-12-31 DIAGNOSIS — M25569 Pain in unspecified knee: Secondary | ICD-10-CM | POA: Diagnosis not present

## 2011-12-31 DIAGNOSIS — M25669 Stiffness of unspecified knee, not elsewhere classified: Secondary | ICD-10-CM | POA: Diagnosis not present

## 2012-01-02 DIAGNOSIS — M1710 Unilateral primary osteoarthritis, unspecified knee: Secondary | ICD-10-CM | POA: Diagnosis not present

## 2012-01-02 DIAGNOSIS — M25569 Pain in unspecified knee: Secondary | ICD-10-CM | POA: Diagnosis not present

## 2012-01-02 DIAGNOSIS — M25669 Stiffness of unspecified knee, not elsewhere classified: Secondary | ICD-10-CM | POA: Diagnosis not present

## 2012-01-04 DIAGNOSIS — Z471 Aftercare following joint replacement surgery: Secondary | ICD-10-CM | POA: Diagnosis not present

## 2012-01-04 DIAGNOSIS — M1710 Unilateral primary osteoarthritis, unspecified knee: Secondary | ICD-10-CM | POA: Diagnosis not present

## 2012-01-04 DIAGNOSIS — M25569 Pain in unspecified knee: Secondary | ICD-10-CM | POA: Diagnosis not present

## 2012-01-04 DIAGNOSIS — M25669 Stiffness of unspecified knee, not elsewhere classified: Secondary | ICD-10-CM | POA: Diagnosis not present

## 2012-01-06 NOTE — Discharge Summary (Signed)
  DISCHARGE SUMMARY [phone]

## 2012-01-07 DIAGNOSIS — M1710 Unilateral primary osteoarthritis, unspecified knee: Secondary | ICD-10-CM | POA: Diagnosis not present

## 2012-01-07 DIAGNOSIS — Z471 Aftercare following joint replacement surgery: Secondary | ICD-10-CM | POA: Diagnosis not present

## 2012-01-07 DIAGNOSIS — M25569 Pain in unspecified knee: Secondary | ICD-10-CM | POA: Diagnosis not present

## 2012-01-07 DIAGNOSIS — M25669 Stiffness of unspecified knee, not elsewhere classified: Secondary | ICD-10-CM | POA: Diagnosis not present

## 2012-01-07 NOTE — Discharge Summary (Signed)
NAMESERAJ, DUNNAM NO.:  [id number]  MEDICAL RECORD NO.:  [id number]  LOCATION:  5011                         FACILITY:  [facility]  PHYSICIAN:  Loreta Ave, M.D. DATE OF BIRTH:  Jan 08, 1934  DATE OF ADMISSION:  11/28/2011 DATE OF DISCHARGE:  12/01/2011                              DISCHARGE SUMMARY   FINAL DIAGNOSES: 1. Status post left total knee replacement for end-stage degenerative     joint disease. 2. Hypertension. 3. Coronary artery disease. 4. Hypercholesterolemia. 5. Elevated PSA.  HISTORY PRESENT ILLNESS:  A 78 year old black male with history of end- stage DJD, left knee and chronic pain who presented to our office for preop evaluation for total knee replacement.  He had progressively worsening pain with failure to response with conservative treatment. Significant decrease in his daily activities due to the ongoing complaint.  HOSPITAL COURSE:  On November 28, 2011, the patient was taken to the [facility] OR and a left total knee replacement procedure was performed. Surgeon Mckinley Jewel, M.D. and assistant Zonia Kief, PA-C.  Anesthesia general with femoral nerve block.  Minimal blood loss.  No specimens or cultures.  Tourniquet time 97 minutes.  One Hemovac drain used.  No surgical or anesthesia complications and the patient was transferred to recovery in stable condition.  After arriving to the orthopedic unit, pharmacy protocol Coumadin and Lovenox started for DVT prophylaxis.  On November 29, 2011, the patient doing well with good pain control. Dressing clean, dry, and intact.  Discontinued the Foley and PCA.  PT and OT consults.  On November 30, 2011, the patient is doing well, but had some difficulty voiding the previous night.  Did well with therapy. Hemoglobin 9.2, hematocrit 28.0.  Sodium 137, potassium 3.1, chloride 99, CO2 28, BUN 12, creatinine 0.96, glucose 134, INR 1.48.  Knee wound looks good and staples intact.  No drainage or  signs of infection. Hemovac drain removed.  On December 01, 2011, the patient doing very well.  Pain controlled.  Did great with therapy and he is ready for discharge home.  Hemoglobin 8.5, sodium 136, potassium 3.0, chloride 100, CO2 28, BUN 10, creatinine 0.93, glucose 138, INR of 1.9.  Knee wound looks good and staples intact.  No drainage or signs of infection. Calf nontender, neurovascular intact.  DISPOSITION:  Discharged home.  CONDITION:  Stable.  MEDICATIONS: 1. Percocet 5/325 1-2 tabs p.o. q.4-6 h. p.r.n. for pain. 2. Robaxin 500 mg 1 tab p.o. q.6 h. p.r.n. for spasms. 3. Coumadin per pharmacy protocol. 4. Lovenox 30 mg 1 subcu injection q.12 hours and discontinue when     Coumadin is therapeutic with INR 2-3. 5. Resume previous home meds.  INSTRUCTIONS:  The patient will continue to work with PT and OT to improve ambulation and knee range of motion and strengthening. Weightbear as tolerated.  Daily dressing changes with 4 x4 gauze and apply TED hose over this.  Okay to shower, but no tub soaking.  Follow up in the office when he is 2 weeks postop for recheck, but will return sooner if needed.     Genene Churn. Denton Meek.   ______________________________ Margarette Asal.  Eulah Pont, M.D.    JMO/MEDQ  D:  01/06/2012  T:  01/07/2012  Job:  [id number]

## 2012-01-14 DIAGNOSIS — M1710 Unilateral primary osteoarthritis, unspecified knee: Secondary | ICD-10-CM | POA: Diagnosis not present

## 2012-01-14 DIAGNOSIS — Z471 Aftercare following joint replacement surgery: Secondary | ICD-10-CM | POA: Diagnosis not present

## 2012-01-14 DIAGNOSIS — M25669 Stiffness of unspecified knee, not elsewhere classified: Secondary | ICD-10-CM | POA: Diagnosis not present

## 2012-01-14 DIAGNOSIS — M25569 Pain in unspecified knee: Secondary | ICD-10-CM | POA: Diagnosis not present

## 2012-01-16 DIAGNOSIS — M25669 Stiffness of unspecified knee, not elsewhere classified: Secondary | ICD-10-CM | POA: Diagnosis not present

## 2012-01-16 DIAGNOSIS — M25569 Pain in unspecified knee: Secondary | ICD-10-CM | POA: Diagnosis not present

## 2012-01-16 DIAGNOSIS — M1990 Unspecified osteoarthritis, unspecified site: Secondary | ICD-10-CM | POA: Diagnosis not present

## 2012-01-16 DIAGNOSIS — Z471 Aftercare following joint replacement surgery: Secondary | ICD-10-CM | POA: Diagnosis not present

## 2012-01-16 DIAGNOSIS — E785 Hyperlipidemia, unspecified: Secondary | ICD-10-CM | POA: Diagnosis not present

## 2012-01-16 DIAGNOSIS — M1710 Unilateral primary osteoarthritis, unspecified knee: Secondary | ICD-10-CM | POA: Diagnosis not present

## 2012-01-16 DIAGNOSIS — M1250 Traumatic arthropathy, unspecified site: Secondary | ICD-10-CM | POA: Diagnosis not present

## 2012-01-16 DIAGNOSIS — I10 Essential (primary) hypertension: Secondary | ICD-10-CM | POA: Diagnosis not present

## 2012-01-17 ENCOUNTER — Encounter: Payer: Self-pay | Admitting: Orthopedic Surgery

## 2012-01-17 ENCOUNTER — Ambulatory Visit: Payer: Medicare Other | Admitting: Orthopedic Surgery

## 2012-01-21 DIAGNOSIS — M25569 Pain in unspecified knee: Secondary | ICD-10-CM | POA: Diagnosis not present

## 2012-01-21 DIAGNOSIS — M1710 Unilateral primary osteoarthritis, unspecified knee: Secondary | ICD-10-CM | POA: Diagnosis not present

## 2012-01-21 DIAGNOSIS — Z471 Aftercare following joint replacement surgery: Secondary | ICD-10-CM | POA: Diagnosis not present

## 2012-01-21 DIAGNOSIS — M25669 Stiffness of unspecified knee, not elsewhere classified: Secondary | ICD-10-CM | POA: Diagnosis not present

## 2012-02-19 DIAGNOSIS — M1710 Unilateral primary osteoarthritis, unspecified knee: Secondary | ICD-10-CM | POA: Diagnosis not present

## 2012-02-19 DIAGNOSIS — Z471 Aftercare following joint replacement surgery: Secondary | ICD-10-CM | POA: Diagnosis not present

## 2012-05-12 DIAGNOSIS — N400 Enlarged prostate without lower urinary tract symptoms: Secondary | ICD-10-CM | POA: Diagnosis not present

## 2012-07-28 DIAGNOSIS — M25669 Stiffness of unspecified knee, not elsewhere classified: Secondary | ICD-10-CM | POA: Diagnosis not present

## 2012-07-28 DIAGNOSIS — M1710 Unilateral primary osteoarthritis, unspecified knee: Secondary | ICD-10-CM | POA: Diagnosis not present

## 2012-09-19 DIAGNOSIS — M1710 Unilateral primary osteoarthritis, unspecified knee: Secondary | ICD-10-CM | POA: Diagnosis not present

## 2012-09-26 DIAGNOSIS — M1710 Unilateral primary osteoarthritis, unspecified knee: Secondary | ICD-10-CM | POA: Diagnosis not present

## 2012-10-13 DIAGNOSIS — I110 Hypertensive heart disease with heart failure: Secondary | ICD-10-CM | POA: Diagnosis not present

## 2012-10-13 DIAGNOSIS — M1990 Unspecified osteoarthritis, unspecified site: Secondary | ICD-10-CM | POA: Diagnosis not present

## 2012-10-13 DIAGNOSIS — M23302 Other meniscus derangements, unspecified lateral meniscus, unspecified knee: Secondary | ICD-10-CM | POA: Diagnosis not present

## 2012-10-13 DIAGNOSIS — Z5309 Procedure and treatment not carried out because of other contraindication: Secondary | ICD-10-CM | POA: Diagnosis not present

## 2012-10-13 DIAGNOSIS — E785 Hyperlipidemia, unspecified: Secondary | ICD-10-CM | POA: Diagnosis not present

## 2012-10-13 DIAGNOSIS — I499 Cardiac arrhythmia, unspecified: Secondary | ICD-10-CM | POA: Diagnosis not present

## 2012-10-17 DIAGNOSIS — I10 Essential (primary) hypertension: Secondary | ICD-10-CM | POA: Diagnosis not present

## 2012-10-20 DIAGNOSIS — M1990 Unspecified osteoarthritis, unspecified site: Secondary | ICD-10-CM | POA: Diagnosis not present

## 2012-10-20 DIAGNOSIS — I119 Hypertensive heart disease without heart failure: Secondary | ICD-10-CM | POA: Diagnosis not present

## 2012-10-20 DIAGNOSIS — G609 Hereditary and idiopathic neuropathy, unspecified: Secondary | ICD-10-CM | POA: Diagnosis not present

## 2012-10-20 DIAGNOSIS — I4892 Unspecified atrial flutter: Secondary | ICD-10-CM | POA: Diagnosis not present

## 2012-11-03 DIAGNOSIS — Z79899 Other long term (current) drug therapy: Secondary | ICD-10-CM | POA: Diagnosis not present

## 2012-11-03 DIAGNOSIS — R5381 Other malaise: Secondary | ICD-10-CM | POA: Diagnosis not present

## 2012-11-03 DIAGNOSIS — E785 Hyperlipidemia, unspecified: Secondary | ICD-10-CM | POA: Diagnosis not present

## 2012-11-03 DIAGNOSIS — R5383 Other fatigue: Secondary | ICD-10-CM | POA: Diagnosis not present

## 2012-12-01 ENCOUNTER — Telehealth: Payer: Self-pay | Admitting: Orthopedic Surgery

## 2012-12-01 NOTE — Telephone Encounter (Signed)
Patient called, relates has new problem, back pain, which had started over weekend.  Asked about appointment here Vs. Going to the Emergency Room.  I explained about back evaluations per office policy, and would be glad to review with Dr. Romeo Apple and then call patient to further advise, as well as offering option of contacting his primary care physician, or to the Emergency Department as mentioned.  He then stated that he happened to be in [facility] yesterday, 11/30/12, so he went to the Emergency Room at the [facility], and was prescribed some medication, which he said was not helping.  I relayed that Dr. Romeo Apple would need to review the medical records from the E.R. Physician with whom he received treatment.  He did not seem to be satisfied with this information.  Please advise.  Patient [phone].  (Note: patient has had a total knee arthroplasty, states through [facility], since last visit here.)

## 2012-12-02 NOTE — Telephone Encounter (Signed)
Called back to patient to offer appointment, reached answer machine, left message to return call.

## 2012-12-02 NOTE — Telephone Encounter (Signed)
1045 12/19?  How about this date

## 2012-12-03 NOTE — Telephone Encounter (Signed)
Tried calling back to patient again today.  Voice message did not come on.  No answer.

## 2013-01-30 DIAGNOSIS — I2510 Atherosclerotic heart disease of native coronary artery without angina pectoris: Secondary | ICD-10-CM | POA: Diagnosis not present

## 2013-01-30 DIAGNOSIS — I359 Nonrheumatic aortic valve disorder, unspecified: Secondary | ICD-10-CM | POA: Diagnosis not present

## 2013-01-30 DIAGNOSIS — I10 Essential (primary) hypertension: Secondary | ICD-10-CM | POA: Diagnosis not present

## 2013-01-30 DIAGNOSIS — E782 Mixed hyperlipidemia: Secondary | ICD-10-CM | POA: Diagnosis not present

## 2013-02-18 ENCOUNTER — Other Ambulatory Visit (HOSPITAL_COMMUNITY): Payer: Self-pay | Admitting: Cardiovascular Disease

## 2013-02-18 DIAGNOSIS — D497 Neoplasm of unspecified behavior of endocrine glands and other parts of nervous system: Secondary | ICD-10-CM

## 2013-02-18 DIAGNOSIS — R011 Cardiac murmur, unspecified: Secondary | ICD-10-CM

## 2013-02-18 DIAGNOSIS — I350 Nonrheumatic aortic (valve) stenosis: Secondary | ICD-10-CM

## 2013-02-18 DIAGNOSIS — I714 Abdominal aortic aneurysm, without rupture: Secondary | ICD-10-CM

## 2013-02-25 DIAGNOSIS — R030 Elevated blood-pressure reading, without diagnosis of hypertension: Secondary | ICD-10-CM | POA: Diagnosis not present

## 2013-02-25 DIAGNOSIS — D649 Anemia, unspecified: Secondary | ICD-10-CM | POA: Diagnosis not present

## 2013-02-25 DIAGNOSIS — N400 Enlarged prostate without lower urinary tract symptoms: Secondary | ICD-10-CM | POA: Diagnosis not present

## 2013-02-25 DIAGNOSIS — I110 Hypertensive heart disease with heart failure: Secondary | ICD-10-CM | POA: Diagnosis not present

## 2013-02-25 DIAGNOSIS — E785 Hyperlipidemia, unspecified: Secondary | ICD-10-CM | POA: Diagnosis not present

## 2013-02-25 DIAGNOSIS — M7040 Prepatellar bursitis, unspecified knee: Secondary | ICD-10-CM | POA: Diagnosis not present

## 2013-02-25 DIAGNOSIS — E119 Type 2 diabetes mellitus without complications: Secondary | ICD-10-CM | POA: Diagnosis not present

## 2013-02-25 DIAGNOSIS — I10 Essential (primary) hypertension: Secondary | ICD-10-CM | POA: Diagnosis not present

## 2013-03-03 DIAGNOSIS — R5381 Other malaise: Secondary | ICD-10-CM | POA: Diagnosis not present

## 2013-03-03 DIAGNOSIS — I110 Hypertensive heart disease with heart failure: Secondary | ICD-10-CM | POA: Diagnosis not present

## 2013-03-03 DIAGNOSIS — E785 Hyperlipidemia, unspecified: Secondary | ICD-10-CM | POA: Diagnosis not present

## 2013-03-03 DIAGNOSIS — M069 Rheumatoid arthritis, unspecified: Secondary | ICD-10-CM | POA: Diagnosis not present

## 2013-03-09 DIAGNOSIS — Z79899 Other long term (current) drug therapy: Secondary | ICD-10-CM | POA: Diagnosis not present

## 2013-03-09 DIAGNOSIS — E782 Mixed hyperlipidemia: Secondary | ICD-10-CM | POA: Diagnosis not present

## 2013-03-09 DIAGNOSIS — E878 Other disorders of electrolyte and fluid balance, not elsewhere classified: Secondary | ICD-10-CM | POA: Diagnosis not present

## 2013-03-11 ENCOUNTER — Ambulatory Visit (HOSPITAL_COMMUNITY)
Admission: RE | Admit: 2013-03-11 | Discharge: 2013-03-11 | Disposition: A | Discharge status: Home / Self Care | Payer: Medicare Other | Source: Ambulatory Visit | Attending: Cardiovascular Disease | Admitting: Cardiovascular Disease

## 2013-03-11 DIAGNOSIS — I10 Essential (primary) hypertension: Secondary | ICD-10-CM | POA: Diagnosis not present

## 2013-03-11 DIAGNOSIS — I359 Nonrheumatic aortic valve disorder, unspecified: Secondary | ICD-10-CM | POA: Diagnosis not present

## 2013-03-11 DIAGNOSIS — I714 Abdominal aortic aneurysm, without rupture, unspecified: Secondary | ICD-10-CM | POA: Insufficient documentation

## 2013-03-11 DIAGNOSIS — I350 Nonrheumatic aortic (valve) stenosis: Secondary | ICD-10-CM

## 2013-03-11 DIAGNOSIS — D497 Neoplasm of unspecified behavior of endocrine glands and other parts of nervous system: Secondary | ICD-10-CM | POA: Insufficient documentation

## 2013-03-11 DIAGNOSIS — R011 Cardiac murmur, unspecified: Secondary | ICD-10-CM | POA: Diagnosis not present

## 2013-03-11 DIAGNOSIS — R9431 Abnormal electrocardiogram [ECG] [EKG]: Secondary | ICD-10-CM | POA: Diagnosis not present

## 2013-03-11 HISTORY — PX: OTHER SURGICAL HISTORY: SHX169

## 2013-03-11 HISTORY — PX: TRANSTHORACIC ECHOCARDIOGRAM: SHX275

## 2013-03-11 NOTE — Progress Notes (Signed)
2D Echo Performed 03/11/2013    Karl Howell, RCS  

## 2013-03-11 NOTE — Progress Notes (Signed)
Renal Duplex Completed. Karl Howell  

## 2013-03-16 ENCOUNTER — Emergency Department (HOSPITAL_COMMUNITY)
Admission: EM | Admit: 2013-03-16 | Discharge: 2013-03-17 | Disposition: A | Discharge status: Home / Self Care | Payer: Medicare Other | Attending: Emergency Medicine | Admitting: Emergency Medicine

## 2013-03-16 ENCOUNTER — Encounter (HOSPITAL_COMMUNITY): Payer: Self-pay

## 2013-03-16 DIAGNOSIS — I10 Essential (primary) hypertension: Secondary | ICD-10-CM | POA: Diagnosis not present

## 2013-03-16 DIAGNOSIS — Z79899 Other long term (current) drug therapy: Secondary | ICD-10-CM | POA: Insufficient documentation

## 2013-03-16 DIAGNOSIS — Z9889 Other specified postprocedural states: Secondary | ICD-10-CM | POA: Insufficient documentation

## 2013-03-16 DIAGNOSIS — Z7982 Long term (current) use of aspirin: Secondary | ICD-10-CM | POA: Insufficient documentation

## 2013-03-16 DIAGNOSIS — M129 Arthropathy, unspecified: Secondary | ICD-10-CM | POA: Insufficient documentation

## 2013-03-16 DIAGNOSIS — R319 Hematuria, unspecified: Secondary | ICD-10-CM | POA: Diagnosis not present

## 2013-03-16 DIAGNOSIS — Z9861 Coronary angioplasty status: Secondary | ICD-10-CM | POA: Diagnosis not present

## 2013-03-16 LAB — CBC WITH DIFFERENTIAL/PLATELET
Basophils Absolute: 0 10*3/uL (ref 0.0–0.1)
Basophils Relative: 0 % (ref 0–1)
Eosinophils Absolute: 0.2 10*3/uL (ref 0.0–0.7)
Eosinophils Relative: 3 % (ref 0–5)
HCT: 39.3 % (ref 39.0–52.0)
Hemoglobin: 12.8 g/dL — ABNORMAL LOW (ref 13.0–17.0)
Lymphocytes Relative: 16 % (ref 12–46)
Lymphs Abs: 1 10*3/uL (ref 0.7–4.0)
MCH: 26.8 pg (ref 26.0–34.0)
MCHC: 32.6 g/dL (ref 30.0–36.0)
MCV: 82.4 fL (ref 78.0–100.0)
Monocytes Absolute: 0.4 10*3/uL (ref 0.1–1.0)
Monocytes Relative: 7 % (ref 3–12)
Neutro Abs: 4.4 10*3/uL (ref 1.7–7.7)
Neutrophils Relative %: 74 % (ref 43–77)
Platelets: 108 10*3/uL — ABNORMAL LOW (ref 150–400)
RBC: 4.77 MIL/uL (ref 4.22–5.81)
RDW: 14.2 % (ref 11.5–15.5)
WBC: 6 10*3/uL (ref 4.0–10.5)

## 2013-03-16 LAB — URINALYSIS, ROUTINE W REFLEX MICROSCOPIC
Bilirubin Urine: NEGATIVE
Glucose, UA: NEGATIVE mg/dL
Ketones, ur: NEGATIVE mg/dL
Leukocytes, UA: NEGATIVE
Nitrite: NEGATIVE
Specific Gravity, Urine: 1.015 (ref 1.005–1.030)
Urobilinogen, UA: 0.2 mg/dL (ref 0.0–1.0)
pH: 6.5 (ref 5.0–8.0)

## 2013-03-16 LAB — BASIC METABOLIC PANEL
BUN: 15 mg/dL (ref 6–23)
CO2: 28 mEq/L (ref 19–32)
Calcium: 9.5 mg/dL (ref 8.4–10.5)
Chloride: 99 mEq/L (ref 96–112)
Creatinine, Ser: 1.08 mg/dL (ref 0.50–1.35)
GFR calc Af Amer: 75 mL/min — ABNORMAL LOW (ref >90)
GFR calc non Af Amer: 65 mL/min — ABNORMAL LOW (ref >90)
Glucose, Bld: 99 mg/dL (ref 70–99)
Potassium: 3.2 mEq/L — ABNORMAL LOW (ref 3.5–5.1)
Sodium: 139 mEq/L (ref 135–145)

## 2013-03-16 LAB — URINE MICROSCOPIC-ADD ON

## 2013-03-16 MED ORDER — CIPROFLOXACIN HCL 500 MG PO TABS: 500.0000 mg | ORAL_TABLET | Freq: Two times a day (BID) | ORAL | Status: DC

## 2013-03-16 NOTE — ED Provider Notes (Addendum)
History    This chart was scribed for Karl Jakes, MD by Charolett Bumpers, ED Scribe. The patient was seen in room APA18/APA18. Patient's care was started at 10:05 PM.   CSN: [id number]  Arrival date & time 03/16/13  2126   First MD Initiated Contact with Patient 03/16/13 2205      Chief Complaint  Patient presents with  . Hematuria    HPI Comments: Karl Howell is a 79 y.o. male who presents to the Emergency Department complaining of hematuria that started earlier tonight. He reports he had 2 episodes tonight, but when he urinated in ED, his urine was almost clear. He denies any h/o hematuria. He states that he was seen by an urologist and had a uroscopy that was normal. He denies any worsening difficulty with urinating, dysuria, abdominal pain, back pain, nausea or vomiting. He denies taking Coumadin, but takes an aspirin daily. He denies any current abx use.   PCP: Dr. Delbert Harness  Patient is a 79 y.o. male presenting with hematuria. The history is provided by the patient. No language interpreter was used.  Hematuria This is a new problem. The current episode started 1 to 2 hours ago. The problem has been resolved. Pertinent negatives include no chest pain, no abdominal pain, no headaches and no shortness of breath. He has tried nothing for the symptoms.    Past Medical History  Diagnosis Date  . HTN (hypertension)   . Arthritis     Past Surgical History  Procedure Laterality Date  . Negative    . Cardiac catheterization      1996    "cleaned out artery out"....no problems since  . Tonsillectomy    . Coronary angioplasty      1996  ??? in [place], [place]  . Total knee arthroplasty  11/28/2011    Procedure: TOTAL KNEE ARTHROPLASTY;  Surgeon: Loreta Ave, MD;  Location: [facility] OR;  Service: Orthopedics;  Laterality: Left;    No family history on file.  History  Substance Use Topics  . Smoking status: Never Smoker   . Smokeless tobacco: Not on file  . Alcohol Use:  1.2 oz/week    2 Cans of beer per week      Review of Systems  Constitutional: Negative for fever and chills.  HENT: Negative for congestion, rhinorrhea and neck pain.   Respiratory: Negative for cough and shortness of breath.   Cardiovascular: Negative for chest pain.  Gastrointestinal: Negative for nausea, vomiting, abdominal pain and diarrhea.  Genitourinary: Positive for hematuria. Negative for dysuria and difficulty urinating.  Musculoskeletal: Negative for back pain.  Skin: Negative for rash.  Neurological: Negative for headaches.  Hematological: Does not bruise/bleed easily.  Psychiatric/Behavioral: Negative for confusion.  All other systems reviewed and are negative.    Allergies  Review of patient's allergies indicates no known allergies.  Home Medications   Current Outpatient Rx  Name  Route  Sig  Dispense  Refill  . amLODipine (NORVASC) 5 MG tablet   Oral   Take 5 mg by mouth daily.          [name] aspirin EC 81 MG tablet   Oral   Take 81 mg by mouth daily.         [name] atorvastatin (LIPITOR) 40 MG tablet   Oral   Take 40 mg by mouth daily.           . Coconut Oil 1000 MG CAPS   Oral  Take 1 capsule by mouth daily.         . Coenzyme Q10 (CO Q 10 PO)   Oral   Take 1 capsule by mouth daily.         . hydrochlorothiazide (HYDRODIURIL) 25 MG tablet   Oral   Take 12.5-25 mg by mouth daily.         [name] lisinopril (PRINIVIL,ZESTRIL) 20 MG tablet   Oral   Take 20 mg by mouth daily.           . Magnesium 250 MG TABS   Oral   Take 1 tablet by mouth daily.         . nebivolol (BYSTOLIC) 10 MG tablet   Oral   Take 2.5 mg by mouth daily.          . ciprofloxacin (CIPRO) 500 MG tablet   Oral   Take 1 tablet (500 mg total) by mouth 2 (two) times daily.   28 tablet   0     Triage Vitals: BP 178/54  Pulse 88  Temp(Src) 97.8 F (36.6 C) (Oral)  Resp 16  Ht 5\' 11"  (1.803 m)  Wt 170 lb (77.111 kg)  BMI 23.72 kg/m2  SpO2  100%  Physical Exam  Nursing note and vitals reviewed. Constitutional: He is oriented to person, place, and time. He appears well-developed and well-nourished. No distress.  HENT:  Head: Normocephalic and atraumatic.  Eyes: Conjunctivae and EOM are normal. Pupils are equal, round, and reactive to light. No scleral icterus.  Neck: Normal range of motion. Neck supple. No tracheal deviation present.  Cardiovascular: Normal rate, regular rhythm and normal heart sounds.   No murmur heard. Pulmonary/Chest: Effort normal and breath sounds normal. No respiratory distress.  Abdominal: Soft. Bowel sounds are normal. He exhibits no distension. There is no tenderness.  Musculoskeletal: Normal range of motion. He exhibits no edema.  No lower extremity swelling.   Lymphadenopathy:    He has no cervical adenopathy.  Neurological: He is alert and oriented to person, place, and time. No cranial nerve deficit.  Skin: Skin is warm and dry.  Psychiatric: He has a normal mood and affect. His behavior is normal.    ED Course  Procedures (including critical care time)  DIAGNOSTIC STUDIES: Oxygen Saturation is 100% on RA, normal by my interpretation.    COORDINATION OF CARE:  10:23 PM-Discussed planned course of treatment with the patient including UA, who is agreeable at this time.   Results for orders placed during the hospital encounter of 03/16/13  URINALYSIS, ROUTINE W REFLEX MICROSCOPIC      Result Value Range   Color, Urine YELLOW  YELLOW   APPearance HAZY (*) CLEAR   Specific Gravity, Urine 1.015  1.005 - 1.030   pH 6.5  5.0 - 8.0   Glucose, UA NEGATIVE  NEGATIVE mg/dL   Hgb urine dipstick LARGE (*) NEGATIVE   Bilirubin Urine NEGATIVE  NEGATIVE   Ketones, ur NEGATIVE  NEGATIVE mg/dL   Protein, ur TRACE (*) NEGATIVE mg/dL   Urobilinogen, UA 0.2  0.0 - 1.0 mg/dL   Nitrite NEGATIVE  NEGATIVE   Leukocytes, UA NEGATIVE  NEGATIVE  URINE MICROSCOPIC-ADD ON      Result Value Range   RBC /  HPF TOO NUMEROUS TO COUNT  <3 RBC/hpf   Bacteria, UA RARE  RARE  CBC WITH DIFFERENTIAL      Result Value Range   WBC 6.0  4.0 - 10.5 K/uL   RBC  4.77  4.22 - 5.81 MIL/uL   Hemoglobin 12.8 (*) 13.0 - 17.0 g/dL   HCT 40.9  81.1 - 91.4 %   MCV 82.4  78.0 - 100.0 fL   MCH 26.8  26.0 - 34.0 pg   MCHC 32.6  30.0 - 36.0 g/dL   RDW 78.2  95.6 - 21.3 %   Platelets 108 (*) 150 - 400 K/uL   Neutrophils Relative 74  43 - 77 %   Neutro Abs 4.4  1.7 - 7.7 K/uL   Lymphocytes Relative 16  12 - 46 %   Lymphs Abs 1.0  0.7 - 4.0 K/uL   Monocytes Relative 7  3 - 12 %   Monocytes Absolute 0.4  0.1 - 1.0 K/uL   Eosinophils Relative 3  0 - 5 %   Eosinophils Absolute 0.2  0.0 - 0.7 K/uL   Basophils Relative 0  0 - 1 %   Basophils Absolute 0.0  0.0 - 0.1 K/uL    No results found. Results for orders placed during the hospital encounter of 03/16/13  URINALYSIS, ROUTINE W REFLEX MICROSCOPIC      Result Value Range   Color, Urine YELLOW  YELLOW   APPearance HAZY (*) CLEAR   Specific Gravity, Urine 1.015  1.005 - 1.030   pH 6.5  5.0 - 8.0   Glucose, UA NEGATIVE  NEGATIVE mg/dL   Hgb urine dipstick LARGE (*) NEGATIVE   Bilirubin Urine NEGATIVE  NEGATIVE   Ketones, ur NEGATIVE  NEGATIVE mg/dL   Protein, ur TRACE (*) NEGATIVE mg/dL   Urobilinogen, UA 0.2  0.0 - 1.0 mg/dL   Nitrite NEGATIVE  NEGATIVE   Leukocytes, UA NEGATIVE  NEGATIVE  URINE MICROSCOPIC-ADD ON      Result Value Range   RBC / HPF TOO NUMEROUS TO COUNT  <3 RBC/hpf   Bacteria, UA RARE  RARE  CBC WITH DIFFERENTIAL      Result Value Range   WBC 6.0  4.0 - 10.5 K/uL   RBC 4.77  4.22 - 5.81 MIL/uL   Hemoglobin 12.8 (*) 13.0 - 17.0 g/dL   HCT 08.6  57.8 - 46.9 %   MCV 82.4  78.0 - 100.0 fL   MCH 26.8  26.0 - 34.0 pg   MCHC 32.6  30.0 - 36.0 g/dL   RDW 62.9  52.8 - 41.3 %   Platelets 108 (*) 150 - 400 K/uL   Neutrophils Relative 74  43 - 77 %   Neutro Abs 4.4  1.7 - 7.7 K/uL   Lymphocytes Relative 16  12 - 46 %   Lymphs Abs 1.0  0.7  - 4.0 K/uL   Monocytes Relative 7  3 - 12 %   Monocytes Absolute 0.4  0.1 - 1.0 K/uL   Eosinophils Relative 3  0 - 5 %   Eosinophils Absolute 0.2  0.0 - 0.7 K/uL   Basophils Relative 0  0 - 1 %   Basophils Absolute 0.0  0.0 - 0.1 K/uL  BASIC METABOLIC PANEL      Result Value Range   Sodium 139  135 - 145 mEq/L   Potassium 3.2 (*) 3.5 - 5.1 mEq/L   Chloride 99  96 - 112 mEq/L   CO2 28  19 - 32 mEq/L   Glucose, Bld 99  70 - 99 mg/dL   BUN 15  6 - 23 mg/dL   Creatinine, Ser 2.44  0.50 - 1.35 mg/dL   Calcium 9.5  8.4 -  10.5 mg/dL   GFR calc non Af Amer 65 (*) >90 mL/min   GFR calc Af Amer 75 (*) >90 mL/min     1. Hematuria       MDM  Patient was sudden onset of hematuria tonight. Has urinated since and seems to be clearing. Urinalysis consistent with hematuria no distinct evidence of urinary tract infection however we'll treat with antibiotic Cipro possibly could be prostate related. Patient has followup with urologist on Wednesday already scheduled. He had some hematuria a year ago and had a cystoscopy done then without any evidence of abnormalities. Patient understands the importance of following up with urology and making sure that the blood and urine clears. Patient has no bowel pain hemoglobin and hematocrit is normal. No evidence of significant blood loss. Vital signs are normal. Other than some hypertension. Urine culture has been sent.  Labs without significant findings no renal insufficiency. Some mild hypokalemia persists but is improved from where he spent in the past. Patient does take diuretics.   I personally performed the services described in this documentation, which was scribed in my presence. The recorded information has been reviewed and is accurate.       Karl Jakes, MD 03/16/13 4098  Karl Jakes, MD 03/16/13 1191  Karl Jakes, MD 03/16/13 2352

## 2013-03-16 NOTE — ED Notes (Signed)
Patient aware of need for urine specimen and will provide as soon as possible

## 2013-03-16 NOTE — ED Notes (Signed)
Went to the bathroom tonight and there was blood in my urine per pt.

## 2013-03-16 NOTE — ED Notes (Signed)
Voided   40 ml urine - now more amber colored and cloudy - specimen sent to lab

## 2013-03-18 ENCOUNTER — Emergency Department (HOSPITAL_COMMUNITY)
Admission: EM | Admit: 2013-03-18 | Discharge: 2013-03-18 | Disposition: A | Discharge status: Home / Self Care | Payer: Medicare Other | Attending: Emergency Medicine | Admitting: Emergency Medicine

## 2013-03-18 ENCOUNTER — Encounter (HOSPITAL_COMMUNITY): Payer: Self-pay | Admitting: Emergency Medicine

## 2013-03-18 ENCOUNTER — Emergency Department (HOSPITAL_COMMUNITY): Payer: Medicare Other

## 2013-03-18 DIAGNOSIS — Z8739 Personal history of other diseases of the musculoskeletal system and connective tissue: Secondary | ICD-10-CM | POA: Diagnosis not present

## 2013-03-18 DIAGNOSIS — Z79899 Other long term (current) drug therapy: Secondary | ICD-10-CM | POA: Insufficient documentation

## 2013-03-18 DIAGNOSIS — R319 Hematuria, unspecified: Secondary | ICD-10-CM | POA: Insufficient documentation

## 2013-03-18 DIAGNOSIS — I10 Essential (primary) hypertension: Secondary | ICD-10-CM | POA: Insufficient documentation

## 2013-03-18 DIAGNOSIS — Z7982 Long term (current) use of aspirin: Secondary | ICD-10-CM | POA: Diagnosis not present

## 2013-03-18 LAB — URINE MICROSCOPIC-ADD ON

## 2013-03-18 LAB — URINALYSIS, ROUTINE W REFLEX MICROSCOPIC
Bilirubin Urine: NEGATIVE
Glucose, UA: NEGATIVE mg/dL
Ketones, ur: NEGATIVE mg/dL
Leukocytes, UA: NEGATIVE
Nitrite: NEGATIVE
Protein, ur: 30 mg/dL — AB
Specific Gravity, Urine: 1.007 (ref 1.005–1.030)
Urobilinogen, UA: 0.2 mg/dL (ref 0.0–1.0)
pH: 6 (ref 5.0–8.0)

## 2013-03-18 LAB — CBC
HCT: 42.1 % (ref 39.0–52.0)
Hemoglobin: 13.7 g/dL (ref 13.0–17.0)
MCH: 26.7 pg (ref 26.0–34.0)
MCHC: 32.5 g/dL (ref 30.0–36.0)
MCV: 81.9 fL (ref 78.0–100.0)
Platelets: 124 10*3/uL — ABNORMAL LOW (ref 150–400)
RBC: 5.14 MIL/uL (ref 4.22–5.81)
RDW: 14 % (ref 11.5–15.5)
WBC: 4.9 10*3/uL (ref 4.0–10.5)

## 2013-03-18 LAB — URINE CULTURE: Colony Count: 45000

## 2013-03-18 LAB — POCT I-STAT, CHEM 8
BUN: 16 mg/dL (ref 6–23)
Calcium, Ion: 1.09 mmol/L — ABNORMAL LOW (ref 1.13–1.30)
Chloride: 101 mEq/L (ref 96–112)
Creatinine, Ser: 0.9 mg/dL (ref 0.50–1.35)
Glucose, Bld: 100 mg/dL — ABNORMAL HIGH (ref 70–99)
HCT: 43 % (ref 39.0–52.0)
Hemoglobin: 14.6 g/dL (ref 13.0–17.0)
Potassium: 3.8 mEq/L (ref 3.5–5.1)
Sodium: 140 mEq/L (ref 135–145)
TCO2: 31 mmol/L (ref 0–100)

## 2013-03-18 NOTE — ED Notes (Signed)
Patient given discharge instructions for hematuria. No rx was provided. Advised to follow up with dr Isabel Caprice tomorrow or if condition worsens to return to this department. Patient voiced understanding of all instructions and had no further questions. Patient ambulated to front lobby without difficulty.

## 2013-03-18 NOTE — ED Notes (Signed)
Patient resting on stretcher. No acute distress noted. Warm blanket provided. Patient denies any discomfort at this time. Family member at bedside. Call light at bedside. Plan of care discussed with patient. Will continue to monitor.

## 2013-03-18 NOTE — ED Provider Notes (Addendum)
History     CSN: [id number]  Arrival date & time 03/18/13  0017   First MD Initiated Contact with Patient 03/18/13 0022      Chief Complaint  Patient presents with  . Hematuria    (Consider location/radiation/quality/duration/timing/severity/associated sxs/prior treatment) Patient is a 79 y.o. male presenting with hematuria. The history is provided by the patient.  Hematuria This is a new problem. The current episode started yesterday. The problem occurs constantly. The problem has not changed since onset.Pertinent negatives include no chest pain, no abdominal pain, no headaches and no shortness of breath. Nothing aggravates the symptoms. Nothing relieves the symptoms.    Past Medical History  Diagnosis Date  . HTN (hypertension)   . Arthritis     Past Surgical History  Procedure Laterality Date  . Negative    . Cardiac catheterization      1996    "cleaned out artery out"....no problems since  . Tonsillectomy    . Coronary angioplasty      1996  ??? in [place], [place]  . Total knee arthroplasty  11/28/2011    Procedure: TOTAL KNEE ARTHROPLASTY;  Surgeon: Loreta Ave, MD;  Location: [facility] OR;  Service: Orthopedics;  Laterality: Left;    No family history on file.  History  Substance Use Topics  . Smoking status: Never Smoker   . Smokeless tobacco: Not on file  . Alcohol Use: 1.2 oz/week    2 Cans of beer per week      Review of Systems  Respiratory: Negative for shortness of breath.   Cardiovascular: Negative for chest pain.  Gastrointestinal: Negative for abdominal pain.  Genitourinary: Positive for hematuria.  Neurological: Negative for headaches.  All other systems reviewed and are negative.    Allergies  Review of patient's allergies indicates no known allergies.  Home Medications   Current Outpatient Rx  Name  Route  Sig  Dispense  Refill  . amLODipine (NORVASC) 5 MG tablet   Oral   Take 5 mg by mouth daily.          [name] aspirin EC 81 MG  tablet   Oral   Take 81 mg by mouth daily.         [name] atorvastatin (LIPITOR) 40 MG tablet   Oral   Take 40 mg by mouth daily.           . ciprofloxacin (CIPRO) 500 MG tablet   Oral   Take 1 tablet (500 mg total) by mouth 2 (two) times daily.   28 tablet   0   . Coconut Oil 1000 MG CAPS   Oral   Take 1 capsule by mouth daily.         . Coenzyme Q10 (CO Q 10 PO)   Oral   Take 1 capsule by mouth daily.         . hydrochlorothiazide (HYDRODIURIL) 25 MG tablet   Oral   Take 12.5-25 mg by mouth daily.         [name] lisinopril (PRINIVIL,ZESTRIL) 20 MG tablet   Oral   Take 20 mg by mouth daily.           . Magnesium 250 MG TABS   Oral   Take 1 tablet by mouth daily.         . nebivolol (BYSTOLIC) 10 MG tablet   Oral   Take 2.5 mg by mouth daily.            BP 177/71  Pulse 80  Temp(Src) 97.8 F (36.6 C) (Oral)  Resp 20  SpO2 100%  Physical Exam  Nursing note and vitals reviewed. Constitutional: He is oriented to person, place, and time. He appears well-developed and well-nourished.  HENT:  Head: Normocephalic and atraumatic.  Eyes: Conjunctivae are normal. Pupils are equal, round, and reactive to light.  Neck: Normal range of motion. Neck supple.  Cardiovascular: Normal rate, regular rhythm, normal heart sounds and intact distal pulses.   Pulmonary/Chest: Effort normal and breath sounds normal.  Abdominal: Soft. Bowel sounds are normal.  Neurological: He is alert and oriented to person, place, and time.  Skin: Skin is warm and dry.  Psychiatric: He has a normal mood and affect. His behavior is normal. Judgment and thought content normal.    ED Course  Procedures (including critical care time)  Labs Reviewed  CBC  URINALYSIS, ROUTINE W REFLEX MICROSCOPIC   No results found.   No diagnosis found.    MDM   recurrent hematuria since yesterday.  No recent urologic fu,  Will ct, lab,  reassess   No sxs of obstruction.  ? Prostatitis on ct.   Will advise to cont cipro, and fu oupt with urology,  Ret new/worsening sxs     Niharika Savino Lytle Michaels, MD 03/18/13 0047  Elford Evilsizer Lytle Michaels, MD 03/18/13 0201

## 2013-03-18 NOTE — ED Notes (Signed)
Patient resting on stretcher at this time. Patient states he began noticing blood in his urine last night. Denies any pain. No acute distress noted, resp are even and unlabored. Family member at bedside. Call light on bed. Will continue to monitor

## 2013-03-18 NOTE — ED Notes (Signed)
PT. REPORTS INTERMITTENT HEMATURIA SEEN AT [facility] LAST NIGHT DISCHARGED HOME WITH PRESCRIPTION WITH NO RELIEF.

## 2013-03-19 DIAGNOSIS — R310 Gross hematuria: Secondary | ICD-10-CM | POA: Diagnosis not present

## 2013-03-19 DIAGNOSIS — R972 Elevated prostate specific antigen [PSA]: Secondary | ICD-10-CM | POA: Diagnosis not present

## 2013-03-19 DIAGNOSIS — N401 Enlarged prostate with lower urinary tract symptoms: Secondary | ICD-10-CM | POA: Diagnosis not present

## 2013-03-19 DIAGNOSIS — N138 Other obstructive and reflux uropathy: Secondary | ICD-10-CM | POA: Diagnosis not present

## 2013-03-23 DIAGNOSIS — E782 Mixed hyperlipidemia: Secondary | ICD-10-CM | POA: Diagnosis not present

## 2013-03-23 DIAGNOSIS — I495 Sick sinus syndrome: Secondary | ICD-10-CM | POA: Diagnosis not present

## 2013-03-23 DIAGNOSIS — I2510 Atherosclerotic heart disease of native coronary artery without angina pectoris: Secondary | ICD-10-CM | POA: Diagnosis not present

## 2013-03-23 DIAGNOSIS — I10 Essential (primary) hypertension: Secondary | ICD-10-CM | POA: Diagnosis not present

## 2013-04-23 DIAGNOSIS — N401 Enlarged prostate with lower urinary tract symptoms: Secondary | ICD-10-CM | POA: Diagnosis not present

## 2013-04-23 DIAGNOSIS — R972 Elevated prostate specific antigen [PSA]: Secondary | ICD-10-CM | POA: Diagnosis not present

## 2013-04-23 DIAGNOSIS — N138 Other obstructive and reflux uropathy: Secondary | ICD-10-CM | POA: Diagnosis not present

## 2013-04-23 DIAGNOSIS — R310 Gross hematuria: Secondary | ICD-10-CM | POA: Diagnosis not present

## 2013-07-20 ENCOUNTER — Other Ambulatory Visit: Payer: Self-pay | Admitting: Cardiovascular Disease

## 2013-07-20 DIAGNOSIS — E782 Mixed hyperlipidemia: Secondary | ICD-10-CM | POA: Diagnosis not present

## 2013-07-20 DIAGNOSIS — R5383 Other fatigue: Secondary | ICD-10-CM | POA: Diagnosis not present

## 2013-07-20 DIAGNOSIS — I10 Essential (primary) hypertension: Secondary | ICD-10-CM | POA: Diagnosis not present

## 2013-07-20 DIAGNOSIS — I491 Atrial premature depolarization: Secondary | ICD-10-CM | POA: Diagnosis not present

## 2013-07-20 DIAGNOSIS — E878 Other disorders of electrolyte and fluid balance, not elsewhere classified: Secondary | ICD-10-CM | POA: Diagnosis not present

## 2013-07-20 DIAGNOSIS — I2510 Atherosclerotic heart disease of native coronary artery without angina pectoris: Secondary | ICD-10-CM | POA: Diagnosis not present

## 2013-07-20 DIAGNOSIS — R5381 Other malaise: Secondary | ICD-10-CM | POA: Diagnosis not present

## 2013-07-20 DIAGNOSIS — R6889 Other general symptoms and signs: Secondary | ICD-10-CM | POA: Diagnosis not present

## 2013-07-20 LAB — T4, FREE: Free T4: 1.34 ng/dL (ref 0.80–1.80)

## 2013-07-20 LAB — COMPREHENSIVE METABOLIC PANEL
ALT: 20 U/L (ref 0–53)
AST: 21 U/L (ref 0–37)
Albumin: 4.8 g/dL (ref 3.5–5.2)
Alkaline Phosphatase: 51 U/L (ref 39–117)
BUN: 15 mg/dL (ref 6–23)
CO2: 31 mEq/L (ref 19–32)
Calcium: 9.9 mg/dL (ref 8.4–10.5)
Chloride: 98 mEq/L (ref 96–112)
Creat: 1.22 mg/dL (ref 0.50–1.35)
Glucose, Bld: 100 mg/dL — ABNORMAL HIGH (ref 70–99)
Potassium: 3.3 mEq/L — ABNORMAL LOW (ref 3.5–5.3)
Sodium: 140 mEq/L (ref 135–145)
Total Bilirubin: 0.8 mg/dL (ref 0.3–1.2)
Total Protein: 7.5 g/dL (ref 6.0–8.3)

## 2013-07-20 LAB — MAGNESIUM: Magnesium: 2.2 mg/dL (ref 1.5–2.5)

## 2013-07-20 LAB — TSH: TSH: 1.56 u[IU]/mL (ref 0.350–4.500)

## 2013-07-24 ENCOUNTER — Encounter: Payer: Self-pay | Admitting: Cardiovascular Disease

## 2013-08-03 DIAGNOSIS — E782 Mixed hyperlipidemia: Secondary | ICD-10-CM | POA: Diagnosis not present

## 2013-08-03 DIAGNOSIS — E785 Hyperlipidemia, unspecified: Secondary | ICD-10-CM | POA: Diagnosis not present

## 2013-08-03 DIAGNOSIS — I110 Hypertensive heart disease with heart failure: Secondary | ICD-10-CM | POA: Diagnosis not present

## 2013-08-03 DIAGNOSIS — M069 Rheumatoid arthritis, unspecified: Secondary | ICD-10-CM | POA: Diagnosis not present

## 2013-08-03 DIAGNOSIS — I2510 Atherosclerotic heart disease of native coronary artery without angina pectoris: Secondary | ICD-10-CM | POA: Diagnosis not present

## 2013-08-03 DIAGNOSIS — E875 Hyperkalemia: Secondary | ICD-10-CM | POA: Diagnosis not present

## 2013-08-03 DIAGNOSIS — I10 Essential (primary) hypertension: Secondary | ICD-10-CM | POA: Diagnosis not present

## 2013-08-17 ENCOUNTER — Other Ambulatory Visit: Payer: Self-pay | Admitting: Cardiovascular Disease

## 2013-08-17 DIAGNOSIS — R6889 Other general symptoms and signs: Secondary | ICD-10-CM | POA: Diagnosis not present

## 2013-08-17 LAB — COMPREHENSIVE METABOLIC PANEL
ALT: 19 U/L (ref 0–53)
AST: 21 U/L (ref 0–37)
Albumin: 4.6 g/dL (ref 3.5–5.2)
Alkaline Phosphatase: 46 U/L (ref 39–117)
BUN: 15 mg/dL (ref 6–23)
CO2: 32 mEq/L (ref 19–32)
Calcium: 9.4 mg/dL (ref 8.4–10.5)
Chloride: 100 mEq/L (ref 96–112)
Creat: 1.06 mg/dL (ref 0.50–1.35)
Glucose, Bld: 99 mg/dL (ref 70–99)
Potassium: 3.5 mEq/L (ref 3.5–5.3)
Sodium: 142 mEq/L (ref 135–145)
Total Bilirubin: 0.9 mg/dL (ref 0.3–1.2)
Total Protein: 7 g/dL (ref 6.0–8.3)

## 2013-10-20 DIAGNOSIS — R972 Elevated prostate specific antigen [PSA]: Secondary | ICD-10-CM | POA: Diagnosis not present

## 2013-10-20 DIAGNOSIS — R351 Nocturia: Secondary | ICD-10-CM | POA: Diagnosis not present

## 2013-10-20 DIAGNOSIS — R300 Dysuria: Secondary | ICD-10-CM | POA: Diagnosis not present

## 2013-10-20 DIAGNOSIS — R339 Retention of urine, unspecified: Secondary | ICD-10-CM | POA: Diagnosis not present

## 2013-10-29 ENCOUNTER — Encounter: Payer: Self-pay | Admitting: Cardiovascular Disease

## 2013-10-29 ENCOUNTER — Ambulatory Visit (INDEPENDENT_AMBULATORY_CARE_PROVIDER_SITE_OTHER): Payer: Medicare Other | Admitting: Cardiovascular Disease

## 2013-10-29 VITALS — BP 164/70 | HR 76 | Ht 71.0 in | Wt 166.9 lb

## 2013-10-29 DIAGNOSIS — I10 Essential (primary) hypertension: Secondary | ICD-10-CM | POA: Diagnosis not present

## 2013-10-29 DIAGNOSIS — E785 Hyperlipidemia, unspecified: Secondary | ICD-10-CM | POA: Insufficient documentation

## 2013-10-29 DIAGNOSIS — R42 Dizziness and giddiness: Secondary | ICD-10-CM

## 2013-10-29 DIAGNOSIS — I2510 Atherosclerotic heart disease of native coronary artery without angina pectoris: Secondary | ICD-10-CM | POA: Insufficient documentation

## 2013-10-29 MED ORDER — MECLIZINE HCL 12.5 MG PO TABS: 12.5000 mg | ORAL_TABLET | Freq: Two times a day (BID) | ORAL | Status: DC | PRN

## 2013-10-29 NOTE — Patient Instructions (Signed)
Your physician wants you to follow-up in: 6 months with an extender and 1 year with Dr Berry. You will receive a reminder letter in the mail two months in advance. If you don't receive a letter, please call our office to schedule the follow-up appointment.  

## 2013-10-29 NOTE — Assessment & Plan Note (Signed)
On statin therapy. His last lipid profile 03/10/13 revealed a total cholesterol of 159, LDL of 93 and HDL of 51

## 2013-10-29 NOTE — Progress Notes (Signed)
10/29/2013 Eyvonne Mechanic   1934-10-09  [id number]  Primary Physician No primary provider on file. 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 He was a patient of Dr. Susa Griffins. I'm assuming his care. His cardiac risk factor profile is a remarkable for treated hypertension and hyperlipidemia. His mother did die of myocardial infarction at age 37. He does not smoke. Drinks socially. He did have a coronary atherectomy in [place] in 1999 and has been stable since. He is totally asymptomatic denying chest pain or shortness of breath.   Current Outpatient Prescriptions  Medication Sig Dispense Refill  . amLODipine (NORVASC) 2.5 MG tablet Take 2.5 mg by mouth daily.      [name] aspirin EC 81 MG tablet Take 81 mg by mouth daily.      [name] atorvastatin (LIPITOR) 40 MG tablet Take 40 mg by mouth daily.        . Coconut Oil 1000 MG CAPS Take 1 capsule by mouth daily.      . Coenzyme Q10 (CO Q 10 PO) Take 1 capsule by mouth daily.      . hydrochlorothiazide (HYDRODIURIL) 25 MG tablet Take 25 mg by mouth daily.       [name] losartan (COZAAR) 100 MG tablet Take 100 mg by mouth daily.      . Magnesium 250 MG TABS Take 1 tablet by mouth daily.      . nebivolol (BYSTOLIC) 10 MG tablet Take 2.5 mg by mouth daily.       . NON FORMULARY Take 1 tablet by mouth daily. turmerictcurcumin      . potassium chloride 40 MEQ/15ML (20%) LIQD Take 40 mEq by mouth daily. Takes tablet      . meclizine (ANTIVERT) 12.5 MG tablet Take 1 tablet (12.5 mg total) by mouth 2 (two) times daily as needed for dizziness.  30 tablet  0   No current facility-administered medications for this visit.    No Known Allergies  History   Social History  . Marital Status: Married    Spouse Name: N/A    Number of Children: N/A  . Years  of Education: N/A   Occupational History  . Not on file.   Social History Main Topics  . Smoking status: Never Smoker   . Smokeless tobacco: Not on file  . Alcohol Use: 1.2 oz/week    2 Cans of beer per week  . Drug Use: No  . Sexual Activity:    Other Topics Concern  . Not on file   Social History Narrative  . No narrative on file     Review of Systems: General: negative for chills, fever, night sweats or weight changes.  Cardiovascular: negative for chest pain, dyspnea on exertion, edema, orthopnea, palpitations, paroxysmal nocturnal dyspnea or shortness of breath Dermatological: negative for rash Respiratory: negative for cough or wheezing Urologic: negative for hematuria Abdominal: negative for nausea, vomiting, diarrhea, bright red blood per rectum, melena, or hematemesis Neurologic: negative for visual changes, syncope, or dizziness All other systems reviewed and are otherwise negative except as noted above.    Blood pressure 164/70, pulse 76, height 5\' 11"  (1.803 m), weight 166 lb 14.4 oz (75.705 kg).  General appearance: alert and no distress Neck: no adenopathy, no carotid bruit, no JVD, supple, symmetrical, trachea  midline and thyroid not enlarged, symmetric, no tenderness/mass/nodules Lungs: clear to auscultation bilaterally Heart: regular rate and rhythm, S1, S2 normal, no murmur, click, rub or gallop Extremities: extremities normal, atraumatic, no cyanosis or edema  EKG not performed today  ASSESSMENT AND PLAN:   Hyperlipidemia On statin therapy. His last lipid profile 03/10/13 revealed a total cholesterol of 159, LDL of 93 and HDL of 51  Coronary artery disease Status post coronary atherectomy in [place] in 1999. His last Myoview performed 09/18/10 was nonischemic. He denies chest pain or shortness of breath  Essential hypertension Borderline control today on multiple antihypertensive medications so at home this morning his blood pressure was  well-controlled.      Runell Gess MD FACP,FACC,FAHA, [facility] 10/29/2013 10:39 AM

## 2013-10-29 NOTE — Assessment & Plan Note (Signed)
Borderline control today on multiple antihypertensive medications so at home this morning his blood pressure was well-controlled.

## 2013-10-29 NOTE — Assessment & Plan Note (Signed)
Status post coronary atherectomy in [place] in 1999. His last Myoview performed 09/18/10 was nonischemic. He denies chest pain or shortness of breath

## 2013-11-12 DIAGNOSIS — R339 Retention of urine, unspecified: Secondary | ICD-10-CM | POA: Diagnosis not present

## 2013-11-12 DIAGNOSIS — N138 Other obstructive and reflux uropathy: Secondary | ICD-10-CM | POA: Diagnosis not present

## 2013-11-12 DIAGNOSIS — N529 Male erectile dysfunction, unspecified: Secondary | ICD-10-CM | POA: Diagnosis not present

## 2013-11-12 DIAGNOSIS — N139 Obstructive and reflux uropathy, unspecified: Secondary | ICD-10-CM | POA: Diagnosis not present

## 2013-11-28 ENCOUNTER — Encounter (HOSPITAL_COMMUNITY): Payer: Self-pay | Admitting: Emergency Medicine

## 2013-11-28 ENCOUNTER — Emergency Department (HOSPITAL_COMMUNITY)
Admission: EM | Admit: 2013-11-28 | Discharge: 2013-11-28 | Disposition: A | Discharge status: Home / Self Care | Payer: Medicare Other | Attending: Emergency Medicine | Admitting: Emergency Medicine

## 2013-11-28 DIAGNOSIS — M129 Arthropathy, unspecified: Secondary | ICD-10-CM | POA: Diagnosis not present

## 2013-11-28 DIAGNOSIS — E785 Hyperlipidemia, unspecified: Secondary | ICD-10-CM | POA: Insufficient documentation

## 2013-11-28 DIAGNOSIS — Z7982 Long term (current) use of aspirin: Secondary | ICD-10-CM | POA: Diagnosis not present

## 2013-11-28 DIAGNOSIS — Z9861 Coronary angioplasty status: Secondary | ICD-10-CM | POA: Insufficient documentation

## 2013-11-28 DIAGNOSIS — Z79899 Other long term (current) drug therapy: Secondary | ICD-10-CM | POA: Insufficient documentation

## 2013-11-28 DIAGNOSIS — I10 Essential (primary) hypertension: Secondary | ICD-10-CM | POA: Insufficient documentation

## 2013-11-28 DIAGNOSIS — I2510 Atherosclerotic heart disease of native coronary artery without angina pectoris: Secondary | ICD-10-CM | POA: Diagnosis not present

## 2013-11-28 LAB — BASIC METABOLIC PANEL
BUN: 12 mg/dL (ref 6–23)
CO2: 32 mEq/L (ref 19–32)
Calcium: 9.9 mg/dL (ref 8.4–10.5)
Chloride: 98 mEq/L (ref 96–112)
Creatinine, Ser: 1.08 mg/dL (ref 0.50–1.35)
GFR calc Af Amer: 74 mL/min — ABNORMAL LOW (ref >90)
GFR calc non Af Amer: 64 mL/min — ABNORMAL LOW (ref >90)
Glucose, Bld: 120 mg/dL — ABNORMAL HIGH (ref 70–99)
Potassium: 3.6 mEq/L (ref 3.5–5.1)
Sodium: 138 mEq/L (ref 135–145)

## 2013-11-28 MED ORDER — POTASSIUM CHLORIDE CRYS ER 20 MEQ PO TBCR
40.0000 meq | EXTENDED_RELEASE_TABLET | Freq: Once | ORAL | Status: AC
  Administered 2013-11-28: 40 meq via ORAL
  Filled 2013-11-28: qty 2

## 2013-11-28 NOTE — ED Provider Notes (Signed)
CSN: [id number]     Arrival date & time 11/28/13  1478 History   None    Chief Complaint  Patient presents with  . Hypertension   (Consider location/radiation/quality/duration/timing/severity/associated sxs/prior Treatment) Patient is a 79 y.o. male presenting with hypertension. The history is provided by the patient.  Hypertension  He has a long history of hypertension and checks his blood pressure everyday at home. It usually runs approximately 130/80. Last night, his blood pressure was running high and got as high as 200/100. He took an extra dose of amlodipine, losartan, and hydrochlorothiazide but the blood pressure did not come down she came to the ED. He denies headache, tinnitus, chest pain, dyspnea, nausea, vomiting. He does have a history of hypokalemia and states his potassium has not been checked recently. He also states that he just has a vague feeling of not feeling well.  Past Medical History  Diagnosis Date  . HTN (hypertension)   . Arthritis   . Coronary artery disease   . Hyperlipidemia    Past Surgical History  Procedure Laterality Date  . Negative    . Cardiac catheterization      1996    "cleaned out artery out"....no problems since  . Tonsillectomy    . Coronary angioplasty      1996  ??? in [place], [place]  . Total knee arthroplasty  11/28/2011    Procedure: TOTAL KNEE ARTHROPLASTY;  Surgeon: Loreta Ave, MD;  Location: [facility];  Service: Orthopedics;  Laterality: Left;   No family history on file. History  Substance Use Topics  . Smoking status: Never Smoker   . Smokeless tobacco: Not on file  . Alcohol Use: 1.2 oz/week    2 Cans of beer per week    Review of Systems  All other systems reviewed and are negative.    Allergies  Review of patient's allergies indicates no known allergies.  Home Medications   Current Outpatient Rx  Name  Route  Sig  Dispense  Refill  . amLODipine (NORVASC) 2.5 MG tablet   Oral   Take 2.5 mg by mouth daily.          [name] aspirin EC 81 MG tablet   Oral   Take 81 mg by mouth daily.         [name] atorvastatin (LIPITOR) 40 MG tablet   Oral   Take 40 mg by mouth daily.           . Coenzyme Q10 (CO Q 10 PO)   Oral   Take 1 capsule by mouth daily.         . hydrochlorothiazide (HYDRODIURIL) 25 MG tablet   Oral   Take 25 mg by mouth daily.          [name] losartan (COZAAR) 100 MG tablet   Oral   Take 100 mg by mouth daily.         . Magnesium 250 MG TABS   Oral   Take 1 tablet by mouth daily.         . nebivolol (BYSTOLIC) 10 MG tablet   Oral   Take 2.5 mg by mouth daily.          . NON FORMULARY   Oral   Take 1 tablet by mouth daily. turmerictcurcumin         . Coconut Oil 1000 MG CAPS   Oral   Take 1 capsule by mouth daily.         [name]  meclizine (ANTIVERT) 12.5 MG tablet   Oral   Take 1 tablet (12.5 mg total) by mouth 2 (two) times daily as needed for dizziness.   30 tablet   0   . potassium chloride 40 MEQ/15ML (20%) LIQD   Oral   Take 40 mEq by mouth daily. Takes tablet          BP 158/76  Pulse 74  Temp(Src) 98 F (36.7 C) (Oral)  Resp 20  Ht 5\' 11"  (1.803 m)  Wt 165 lb (74.844 kg)  BMI 23.02 kg/m2  SpO2 99% Physical Exam  Nursing note and vitals reviewed.  79 year old male, resting comfortably and in no acute distress. Vital signs are significant for hypertension with blood pressure 150/76. Oxygen saturation is 99%, which is normal. Head is normocephalic and atraumatic. PERRLA, EOMI. Oropharynx is clear. Neck is nontender and supple without adenopathy or JVD. Back is nontender and there is no CVA tenderness. Lungs are clear without rales, wheezes, or rhonchi. Chest is nontender. Heart has regular rate and rhythm with frequent premature beats. No murmur is appreciated. Abdomen is soft, flat, nontender without masses or hepatosplenomegaly and peristalsis is normoactive. Extremities have no cyanosis or edema, full range of motion is present. Skin is warm  and dry without rash. Neurologic: Mental status is normal, cranial nerves are intact, there are no motor or sensory deficits.  ED Course  Procedures (including critical care time) Labs Review Results for orders placed during the hospital encounter of 11/28/13  BASIC METABOLIC PANEL      Result Value Range   Sodium 138  135 - 145 mEq/L   Potassium 3.6  3.5 - 5.1 mEq/L   Chloride 98  96 - 112 mEq/L   CO2 32  19 - 32 mEq/L   Glucose, Bld 120 (*) 70 - 99 mg/dL   BUN 12  6 - 23 mg/dL   Creatinine, Ser 1.61  0.50 - 1.35 mg/dL   Calcium 9.9  8.4 - 09.6 mg/dL   GFR calc non Af Amer 64 (*) >90 mL/min   GFR calc Af Amer 74 (*) >90 mL/min    EKG Interpretation    Date/Time:  Saturday November 28 2013 05:52:17 EST Ventricular Rate:  74 PR Interval:    QRS Duration: 126 QT Interval:  400 QTC Calculation: 444 R Axis:   -54 Text Interpretation:  Wide QRS rhythm Left axis deviation Left ventricular hypertrophy with QRS widening Abnormal ECG When compared with ECG of 08/03/2013, No significant change was found Confirmed by [facility]  MD, Ame Heagle ([id number]) on 11/28/2013 6:15:11 AM            MDM   1. Hypertension    Hypertension without evidence of hypertensive urgency or crisis. By exam, I expect he is having frequent PVCs. ECG will be checked and his electrolytes will be checked.  Potassium is come at borderline at 3.6. Although normal, he clinically seems to be having PVCs and has tended to run low potassium. He is advised to double his potassium dose for the next 5 days. He is encouraged to keep a blood pressure log and bring it with him when he sees his PCP and cardiologist. He is reassured of his blood pressure being not at a critical level in the ED today.  Dione Booze, MD 11/28/13 [phone]

## 2013-11-28 NOTE — ED Notes (Signed)
Pt states his blood pressure at home has been running high since last night 180/100.  Pt states he took extra dose of his bp meds as per his pmd instructions but has not improved.

## 2014-02-05 ENCOUNTER — Other Ambulatory Visit: Payer: Self-pay | Admitting: *Deleted

## 2014-02-05 MED ORDER — LOSARTAN POTASSIUM 100 MG PO TABS: 100.0000 mg | ORAL_TABLET | Freq: Every day | ORAL | Status: DC

## 2014-02-05 MED ORDER — POTASSIUM CHLORIDE CRYS ER 20 MEQ PO TBCR: 20.0000 meq | EXTENDED_RELEASE_TABLET | Freq: Every day | ORAL | Status: DC

## 2014-02-05 MED ORDER — AMLODIPINE BESYLATE 2.5 MG PO TABS: 2.5000 mg | ORAL_TABLET | Freq: Every day | ORAL | Status: DC

## 2014-02-05 NOTE — Telephone Encounter (Signed)
Walk-In Message requesting refill on K+, losartan and amlodipine for 90-day supplies.  Refill(s) sent to pharmacy for 90-days: [facility] in [place].

## 2014-03-09 DIAGNOSIS — R972 Elevated prostate specific antigen [PSA]: Secondary | ICD-10-CM | POA: Diagnosis not present

## 2014-03-09 DIAGNOSIS — E7800 Pure hypercholesterolemia, unspecified: Secondary | ICD-10-CM | POA: Diagnosis not present

## 2014-03-09 DIAGNOSIS — I10 Essential (primary) hypertension: Secondary | ICD-10-CM | POA: Diagnosis not present

## 2014-04-09 DIAGNOSIS — I10 Essential (primary) hypertension: Secondary | ICD-10-CM | POA: Diagnosis not present

## 2014-04-09 DIAGNOSIS — H814 Vertigo of central origin: Secondary | ICD-10-CM | POA: Diagnosis not present

## 2014-04-09 DIAGNOSIS — R42 Dizziness and giddiness: Secondary | ICD-10-CM | POA: Diagnosis not present

## 2014-04-13 ENCOUNTER — Telehealth: Payer: Self-pay | Admitting: *Deleted

## 2014-04-13 NOTE — Telephone Encounter (Signed)
Spoke with patient about some info that he left at the front desk.  He left a news article about ACE inhibitors and ARBS.  The patient was concerned about being on his medications because they were listed in the columns in the article.  I had Erasmo Downer review the information,  She reported that everything is stable for the patient to continue on his current regimen. He is only on an ARB, calcium channel blocker, and diuretic. No duplicates! His potassium and renal function are stable. His blood pressure was in the 150 range at last office visit.   I explained this all to the patient,.  He still seemed confused because his "medications are listed in the columns."  Patient then commented that his heart rate dips down into the 30s.  I explained that he needed to seek medical attention immediately for this situation when/if it occurs.  He wanted to go ahead and make an appt with an APP to talk about the article and his heart rate.  Appt made.

## 2014-04-19 ENCOUNTER — Encounter: Payer: Self-pay | Admitting: Cardiology

## 2014-04-19 ENCOUNTER — Ambulatory Visit (INDEPENDENT_AMBULATORY_CARE_PROVIDER_SITE_OTHER): Payer: Medicare Other | Admitting: Cardiology

## 2014-04-19 VITALS — BP 152/82 | HR 62 | Ht 70.0 in | Wt 166.8 lb

## 2014-04-19 DIAGNOSIS — R001 Bradycardia, unspecified: Secondary | ICD-10-CM | POA: Insufficient documentation

## 2014-04-19 DIAGNOSIS — I498 Other specified cardiac arrhythmias: Secondary | ICD-10-CM | POA: Diagnosis not present

## 2014-04-19 DIAGNOSIS — I2510 Atherosclerotic heart disease of native coronary artery without angina pectoris: Secondary | ICD-10-CM

## 2014-04-19 DIAGNOSIS — I25810 Atherosclerosis of coronary artery bypass graft(s) without angina pectoris: Secondary | ICD-10-CM | POA: Diagnosis not present

## 2014-04-19 DIAGNOSIS — R42 Dizziness and giddiness: Secondary | ICD-10-CM | POA: Insufficient documentation

## 2014-04-19 NOTE — Assessment & Plan Note (Signed)
Episodes of dizziness- has been diagnosed with Vertigo and is on meclizine, but with noted HR in 30s concern bradycardia may be playing a role.  Event monitor X 30 days.  Then follow up with [name].

## 2014-04-19 NOTE — Patient Instructions (Signed)
Your physician has recommended that you wear an event monitor. Event monitors are medical devices that record the heart's electrical activity. Doctors most often [place] these monitors to diagnose arrhythmias. Arrhythmias are problems with the speed or rhythm of the heartbeat. The monitor is a small, portable device. You can wear one while you do your normal daily activities. This is usually used to diagnose what is causing palpitations/syncope (passing out). You will wear the monitor for 30 days.  Your physician recommends that you schedule a follow-up appointment in: 4-6 Weeks with Dr.BERRY

## 2014-04-19 NOTE — Progress Notes (Signed)
04/19/2014   PCP: Maricela Curet, MD   Chief Complaint  Patient presents with  . Article states he should take some of Karl medication    has a paper article that states taking some of Karl medication is dangerous, and wants to make sure he should keep taking them.    Primary Cardiologist:  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 He was a patient of Dr. Terance Ice. [name] has assumed Karl care. Karl cardiac risk factor profile is a remarkable for treated hypertension and hyperlipidemia. Karl Howell did die of myocardial infarction at age 33. He does not smoke. Drinks socially. He did have a coronary atherectomy in [place] in 1999 and has been stable since. He is totally asymptomatic denying chest pain or shortness of breath.  He had called [place] with concerns for Karl medications in an article he read.  The article was not in favor in using ACE and ARB together.  We discussed that he was not on that combination.  He also has decreased Karl BB ( takes a piece of Karl bystolic if HR in the 24Q) due to HR in the 30's at times.  Difficult to determine if the bradycardia is symptomatic.  He has been dizzy and now on meclizine.  No Syncope.  Karl BP is up today but he has not yet had Karl AM meds.   No Known Allergies  Current Outpatient Prescriptions  Medication Sig Dispense Refill  . amLODipine (NORVASC) 2.5 MG tablet Take 1 tablet (2.5 mg total) by mouth daily.  90 tablet  2  . aspirin EC 81 MG tablet Take 81 mg by mouth daily.      [name] atorvastatin (LIPITOR) 40 MG tablet Take 40 mg by mouth daily.        . Coenzyme Q10 (CO Q 10 PO) Take 1 capsule by mouth daily.      . hydrochlorothiazide (HYDRODIURIL) 25 MG tablet Take 25 mg by mouth daily.       [name] losartan (COZAAR) 100 MG tablet Take 1 tablet (100 mg total) by mouth daily.  90 tablet  2    . Magnesium 250 MG TABS Take 1 tablet by mouth daily.      . meclizine (ANTIVERT) 12.5 MG tablet Take 1 tablet (12.5 mg total) by mouth 2 (two) times daily as needed for dizziness.  30 tablet  0  . nebivolol (BYSTOLIC) 10 MG tablet Take 2.5 mg by mouth daily.       . NON FORMULARY Take 1 tablet by mouth daily. turmerictcurcumin      . potassium chloride SA (K-DUR,KLOR-CON) 20 MEQ tablet Take 1 tablet (20 mEq total) by mouth daily.  90 tablet  2   No current facility-administered medications for this visit.    Past Medical History  Diagnosis Date  . HTN (hypertension)   . Arthritis   . Coronary artery disease   . Hyperlipidemia     Past Surgical History  Procedure Laterality Date  . Negative    . Cardiac catheterization      1996    "cleaned out artery out"....no problems since  . Tonsillectomy    . Coronary angioplasty      1996  ??? in [place], [place]  . Total knee arthroplasty  11/28/2011    Procedure: TOTAL KNEE ARTHROPLASTY;  Surgeon: [name], MD;  Location: [place];  Service: Orthopedics;  Laterality: Left;    GTX:MIWOEHO:ZY colds or fevers, no weight changes Skin:no rashes or ulcers HEENT:no blurred vision, no congestion CV:see HPI PUL:see HPI GI:no diarrhea constipation or melena, no indigestion GU:no hematuria, no dysuria MS:no joint pain, no claudication Neuro:no syncope, + lightheadedness- see HPI Endo:no diabetes, no thyroid disease  PHYSICAL EXAM BP 152/82  Pulse 62  Ht 5\' 10"  (1.778 m)  Wt 166 lb 12.8 oz (75.66 kg)  BMI 23.93 kg/m2 General:Pleasant affect, NAD Skin:Warm and dry, brisk capillary refill HEENT:normocephalic, sclera clear, mucus membranes moist Neck:supple, no JVD, no bruits  Heart:S1S2 RRR without murmur, gallup, rub or click Lungs:clear without rales, rhonchi, or wheezes YQM:GNOI, non tender, + BS, do not palpate liver spleen or masses Ext:no lower ext edema, 2+ pedal pulses, 2+ radial pulses Neuro:alert and oriented, MAE, follows  commands, + facial symmetry EKG:SR 1st degree AV block PR 332 ms increased from 331ms.  , PACs, PVC, LVH IVCD.- at times QRS at 134 ms.  Today 161ms  ASSESSMENT AND PLAN Bradycardia Pt reports HR to 35 and he has decreased Karl bystolic to a "crumb" ie: 2.5 mg- a forth of 10 mg tab.  I have given him 5 mg tabs and he will take half a tab- though if Karl HR is less than 60 he does not take.  He will wear an event monitor for 30 days to eval Karl HR.  He will follow up with [name].  He does have dizziness and has been diagnosed with vertigo and is on prn meclizine.  Concern is that some of Karl dizziness may be related to Karl bradycardia.  We briefly discussed Karl possible need for a pacemaker in the future.  He may never need but he may.      Coronary artery disease No chest pain, hx of atherectomy in 1998.   Last stress test 08/2010 no ischemia, low risk scan.  Dizziness Episodes of dizziness- has been diagnosed with Vertigo and is on meclizine, but with noted HR in 30s concern bradycardia may be playing a role.  Event monitor X 30 days.  Then follow up with [name].

## 2014-04-19 NOTE — Assessment & Plan Note (Signed)
Pt reports HR to 35 and he has decreased his bystolic to a "crumb" ie: 2.5 mg- a forth of 10 mg tab.  I have given him 5 mg tabs and he will take half a tab- though if his HR is less than 60 he does not take.  He will wear an event monitor for 30 days to eval his HR.  He will follow up with [name].  He does have dizziness and has been diagnosed with vertigo and is on prn meclizine.  Concern is that some of his dizziness may be related to his bradycardia.  We briefly discussed his possible need for a pacemaker in the future.  He may never need but he may.

## 2014-04-19 NOTE — Assessment & Plan Note (Signed)
No chest pain, hx of atherectomy in 1998.   Last stress test 08/2010 no ischemia, low risk scan.

## 2014-04-22 ENCOUNTER — Telehealth: Payer: Self-pay | Admitting: Cardiology

## 2014-04-22 ENCOUNTER — Observation Stay (HOSPITAL_COMMUNITY): Payer: Medicare Other

## 2014-04-22 ENCOUNTER — Telehealth: Payer: Self-pay | Admitting: *Deleted

## 2014-04-22 ENCOUNTER — Telehealth: Payer: Self-pay | Admitting: Cardiovascular Disease

## 2014-04-22 ENCOUNTER — Ambulatory Visit (INDEPENDENT_AMBULATORY_CARE_PROVIDER_SITE_OTHER): Payer: Medicare Other | Admitting: Cardiology

## 2014-04-22 ENCOUNTER — Encounter: Payer: Self-pay | Admitting: Cardiology

## 2014-04-22 ENCOUNTER — Observation Stay (HOSPITAL_COMMUNITY)
Admission: EM | Admit: 2014-04-22 | Discharge: 2014-04-23 | Disposition: A | Discharge status: Home / Self Care | Payer: Medicare Other | Attending: Internal Medicine | Admitting: Internal Medicine

## 2014-04-22 ENCOUNTER — Encounter (HOSPITAL_COMMUNITY)
Admission: EM | Disposition: A | Discharge status: Home / Self Care | Payer: Self-pay | Source: Home / Self Care | Attending: Emergency Medicine

## 2014-04-22 ENCOUNTER — Encounter (HOSPITAL_COMMUNITY): Payer: Self-pay | Admitting: Emergency Medicine

## 2014-04-22 VITALS — BP 130/60 | HR 80 | Ht 70.0 in | Wt 162.0 lb

## 2014-04-22 DIAGNOSIS — I10 Essential (primary) hypertension: Secondary | ICD-10-CM | POA: Insufficient documentation

## 2014-04-22 DIAGNOSIS — R5381 Other malaise: Secondary | ICD-10-CM

## 2014-04-22 DIAGNOSIS — Z7982 Long term (current) use of aspirin: Secondary | ICD-10-CM | POA: Insufficient documentation

## 2014-04-22 DIAGNOSIS — R5383 Other fatigue: Secondary | ICD-10-CM | POA: Diagnosis not present

## 2014-04-22 DIAGNOSIS — M129 Arthropathy, unspecified: Secondary | ICD-10-CM | POA: Insufficient documentation

## 2014-04-22 DIAGNOSIS — I2510 Atherosclerotic heart disease of native coronary artery without angina pectoris: Secondary | ICD-10-CM

## 2014-04-22 DIAGNOSIS — I442 Atrioventricular block, complete: Principal | ICD-10-CM | POA: Diagnosis present

## 2014-04-22 DIAGNOSIS — R002 Palpitations: Secondary | ICD-10-CM

## 2014-04-22 DIAGNOSIS — I498 Other specified cardiac arrhythmias: Secondary | ICD-10-CM | POA: Diagnosis not present

## 2014-04-22 DIAGNOSIS — I499 Cardiac arrhythmia, unspecified: Secondary | ICD-10-CM | POA: Diagnosis present

## 2014-04-22 DIAGNOSIS — Z9861 Coronary angioplasty status: Secondary | ICD-10-CM | POA: Insufficient documentation

## 2014-04-22 DIAGNOSIS — Z79899 Other long term (current) drug therapy: Secondary | ICD-10-CM

## 2014-04-22 DIAGNOSIS — Z96659 Presence of unspecified artificial knee joint: Secondary | ICD-10-CM | POA: Diagnosis not present

## 2014-04-22 DIAGNOSIS — I455 Other specified heart block: Secondary | ICD-10-CM

## 2014-04-22 DIAGNOSIS — E785 Hyperlipidemia, unspecified: Secondary | ICD-10-CM | POA: Diagnosis not present

## 2014-04-22 DIAGNOSIS — R001 Bradycardia, unspecified: Secondary | ICD-10-CM

## 2014-04-22 HISTORY — DX: Bradycardia, unspecified: R00.1

## 2014-04-22 HISTORY — PX: PACEMAKER INSERTION: SHX728

## 2014-04-22 LAB — CBC WITH DIFFERENTIAL/PLATELET
Basophils Absolute: 0 10*3/uL (ref 0.0–0.1)
Basophils Relative: 0 % (ref 0–1)
Eosinophils Absolute: 0.1 10*3/uL (ref 0.0–0.7)
Eosinophils Relative: 2 % (ref 0–5)
HCT: 39.6 % (ref 39.0–52.0)
Hemoglobin: 13.1 g/dL (ref 13.0–17.0)
Lymphocytes Relative: 23 % (ref 12–46)
Lymphs Abs: 1.3 10*3/uL (ref 0.7–4.0)
MCH: 27.8 pg (ref 26.0–34.0)
MCHC: 33.1 g/dL (ref 30.0–36.0)
MCV: 83.9 fL (ref 78.0–100.0)
Monocytes Absolute: 0.3 10*3/uL (ref 0.1–1.0)
Monocytes Relative: 5 % (ref 3–12)
Neutro Abs: 3.9 10*3/uL (ref 1.7–7.7)
Neutrophils Relative %: 70 % (ref 43–77)
Platelets: 121 10*3/uL — ABNORMAL LOW (ref 150–400)
RBC: 4.72 MIL/uL (ref 4.22–5.81)
RDW: 13.5 % (ref 11.5–15.5)
WBC: 5.6 10*3/uL (ref 4.0–10.5)

## 2014-04-22 LAB — COMPREHENSIVE METABOLIC PANEL
ALT: 20 U/L (ref 0–53)
ALT: 22 U/L (ref 0–53)
AST: 20 U/L (ref 0–37)
AST: 24 U/L (ref 0–37)
Albumin: 4.5 g/dL (ref 3.5–5.2)
Albumin: 3.9 g/dL (ref 3.5–5.2)
Alkaline Phosphatase: 46 U/L (ref 39–117)
Alkaline Phosphatase: 51 U/L (ref 39–117)
BUN: 13 mg/dL (ref 6–23)
BUN: 13 mg/dL (ref 6–23)
CO2: 31 mEq/L (ref 19–32)
CO2: 26 mEq/L (ref 19–32)
Calcium: 9.2 mg/dL (ref 8.4–10.5)
Calcium: 9.2 mg/dL (ref 8.4–10.5)
Chloride: 101 mEq/L (ref 96–112)
Chloride: 98 mEq/L (ref 96–112)
Creat: 1.1 mg/dL (ref 0.50–1.35)
Creatinine, Ser: 1.09 mg/dL (ref 0.50–1.35)
GFR calc Af Amer: 74 mL/min — ABNORMAL LOW (ref >90)
GFR calc non Af Amer: 63 mL/min — ABNORMAL LOW (ref >90)
Glucose, Bld: 116 mg/dL — ABNORMAL HIGH (ref 70–99)
Glucose, Bld: 126 mg/dL — ABNORMAL HIGH (ref 70–99)
Potassium: 3.7 mEq/L (ref 3.5–5.3)
Potassium: 3.1 mEq/L — ABNORMAL LOW (ref 3.7–5.3)
Sodium: 140 mEq/L (ref 135–145)
Sodium: 139 mEq/L (ref 137–147)
Total Bilirubin: 0.9 mg/dL (ref 0.2–1.2)
Total Bilirubin: 0.5 mg/dL (ref 0.3–1.2)
Total Protein: 6.9 g/dL (ref 6.0–8.3)
Total Protein: 7.2 g/dL (ref 6.0–8.3)

## 2014-04-22 LAB — T4, FREE: Free T4: 1.29 ng/dL (ref 0.80–1.80)

## 2014-04-22 LAB — MAGNESIUM
Magnesium: 2.1 mg/dL (ref 1.5–2.5)
Magnesium: 2.1 mg/dL (ref 1.5–2.5)

## 2014-04-22 LAB — TSH: TSH: 2.03 u[IU]/mL (ref 0.350–4.500)

## 2014-04-22 LAB — POCT I-STAT TROPONIN I: Troponin i, poc: 0 ng/mL (ref 0.00–0.08)

## 2014-04-22 SURGERY — PERMANENT PACEMAKER INSERTION
Anesthesia: LOCAL

## 2014-04-22 MED ORDER — ACETAMINOPHEN 325 MG PO TABS
650.0000 mg | ORAL_TABLET | ORAL | Status: DC | PRN
  Filled 2014-04-22: qty 2

## 2014-04-22 MED ORDER — HYDROCHLOROTHIAZIDE 25 MG PO TABS
25.0000 mg | ORAL_TABLET | Freq: Every day | ORAL | Status: DC
  Administered 2014-04-23: 25 mg via ORAL
  Filled 2014-04-22: qty 1

## 2014-04-22 MED ORDER — LIDOCAINE HCL (PF) 1 % IJ SOLN
INTRAMUSCULAR | Status: AC
  Filled 2014-04-22: qty 60

## 2014-04-22 MED ORDER — POTASSIUM CHLORIDE CRYS ER 20 MEQ PO TBCR
20.0000 meq | EXTENDED_RELEASE_TABLET | Freq: Every day | ORAL | Status: DC
  Administered 2014-04-22 – 2014-04-23 (×2): 20 meq via ORAL
  Filled 2014-04-22 (×2): qty 1

## 2014-04-22 MED ORDER — AMLODIPINE BESYLATE 2.5 MG PO TABS
2.5000 mg | ORAL_TABLET | Freq: Every day | ORAL | Status: DC
  Administered 2014-04-22 – 2014-04-23 (×2): 2.5 mg via ORAL
  Filled 2014-04-22 (×2): qty 1

## 2014-04-22 MED ORDER — MAGNESIUM OXIDE 400 (241.3 MG) MG PO TABS
200.0000 mg | ORAL_TABLET | Freq: Every day | ORAL | Status: DC
  Administered 2014-04-23: 200 mg via ORAL
  Filled 2014-04-22: qty 0.5

## 2014-04-22 MED ORDER — LOSARTAN POTASSIUM 50 MG PO TABS: 100.0000 mg | ORAL_TABLET | Freq: Every day | ORAL | Status: DC

## 2014-04-22 MED ORDER — ACETAMINOPHEN 325 MG PO TABS: 325.0000 mg | ORAL_TABLET | ORAL | Status: DC | PRN

## 2014-04-22 MED ORDER — CEFAZOLIN SODIUM-DEXTROSE 2-3 GM-% IV SOLR
2.0000 g | Freq: Four times a day (QID) | INTRAVENOUS | Status: AC
  Administered 2014-04-22 – 2014-04-23 (×3): 2 g via INTRAVENOUS
  Filled 2014-04-22 (×3): qty 50

## 2014-04-22 MED ORDER — ATORVASTATIN CALCIUM 40 MG PO TABS
40.0000 mg | ORAL_TABLET | Freq: Every day | ORAL | Status: DC
  Administered 2014-04-22: 40 mg via ORAL
  Filled 2014-04-22 (×2): qty 1

## 2014-04-22 MED ORDER — CEFAZOLIN SODIUM-DEXTROSE 2-3 GM-% IV SOLR
INTRAVENOUS | Status: AC
  Filled 2014-04-22: qty 50

## 2014-04-22 MED ORDER — SODIUM CHLORIDE 0.9 % IR SOLN
80.0000 mg | Status: DC
  Filled 2014-04-22: qty 2

## 2014-04-22 MED ORDER — SODIUM CHLORIDE 0.9 % IV SOLN: INTRAVENOUS | Status: DC

## 2014-04-22 MED ORDER — CHLORHEXIDINE GLUCONATE 4 % EX LIQD
60.0000 mL | Freq: Once | CUTANEOUS | Status: DC
  Filled 2014-04-22: qty 60

## 2014-04-22 MED ORDER — HYDROCODONE-ACETAMINOPHEN 5-325 MG PO TABS
1.0000 | ORAL_TABLET | Freq: Four times a day (QID) | ORAL | Status: DC | PRN
  Administered 2014-04-22 – 2014-04-23 (×2): 1 via ORAL
  Filled 2014-04-22 (×2): qty 1

## 2014-04-22 MED ORDER — ONDANSETRON HCL 4 MG/2ML IJ SOLN: 4.0000 mg | Freq: Four times a day (QID) | INTRAMUSCULAR | Status: DC | PRN

## 2014-04-22 MED ORDER — LOSARTAN POTASSIUM 50 MG PO TABS
100.0000 mg | ORAL_TABLET | Freq: Every day | ORAL | Status: DC
  Administered 2014-04-22 – 2014-04-23 (×2): 100 mg via ORAL
  Filled 2014-04-22 (×2): qty 2

## 2014-04-22 MED ORDER — ASPIRIN EC 81 MG PO TBEC
81.0000 mg | DELAYED_RELEASE_TABLET | Freq: Every day | ORAL | Status: DC
  Administered 2014-04-23: 81 mg via ORAL
  Filled 2014-04-22: qty 1

## 2014-04-22 MED ORDER — MIDAZOLAM HCL 5 MG/5ML IJ SOLN
INTRAMUSCULAR | Status: AC
  Filled 2014-04-22: qty 5

## 2014-04-22 MED ORDER — CEFAZOLIN SODIUM-DEXTROSE 2-3 GM-% IV SOLR
2.0000 g | INTRAVENOUS | Status: DC
  Filled 2014-04-22: qty 50

## 2014-04-22 MED ORDER — POTASSIUM CHLORIDE CRYS ER 20 MEQ PO TBCR
40.0000 meq | EXTENDED_RELEASE_TABLET | Freq: Once | ORAL | Status: AC
  Administered 2014-04-22: 40 meq via ORAL
  Filled 2014-04-22: qty 2

## 2014-04-22 MED ORDER — MAGNESIUM 250 MG PO TABS: 1.0000 | ORAL_TABLET | Freq: Every day | ORAL | Status: DC

## 2014-04-22 MED ORDER — FENTANYL CITRATE 0.05 MG/ML IJ SOLN
INTRAMUSCULAR | Status: AC
  Filled 2014-04-22: qty 2

## 2014-04-22 NOTE — Telephone Encounter (Signed)
Urgent EKG

## 2014-04-22 NOTE — ED Notes (Signed)
Dr Taylor [facility] at bedside.  

## 2014-04-22 NOTE — Telephone Encounter (Signed)
Correction to previous note.  Pt seen at 11:30am and Brittainy, PA-C notified of urgent report.

## 2014-04-22 NOTE — Telephone Encounter (Signed)
Urgent EKG: 4.3 sec pause at 9:29am [employer].  Will fax report.  Report received and placed on Dr. Evette Georges cart for review.

## 2014-04-22 NOTE — Telephone Encounter (Signed)
  Patient was seen in clinic today for evaluation for a 4.3 sec pause, picked up by [employer] (see office note for details). We received another report at 15:03 that patient had another 4.5 second pause. The strip was reviewed with Dr. Claiborne Billings and he recommended that he go to the ER. He will need to be admitted and monitored in the hospital and considered for PPM. Pt was instructed not to drive himself. He verbalized understanding.   Lyda Jester, PA-C

## 2014-04-22 NOTE — H&P (Signed)
ELECTROPHYSIOLOGY ADMISSION HISTORY & PHYSICAL   Patient ID: Karl Howell MRN: [id number], DOB/AGE: 1934-03-04   Admit date: 04/22/2014 Date of Consult: 04/22/2014  Primary Physician: Cindie Laroche, MD Primary Cardiologist: previously Rollene Fare, MD; now Gwenlyn Found, MD Reason for Consultation: Bradycardia with sinus pauses  History of Present Illness  Karl Howell is a 80 y.o. male with CAD and HTN who was evaluated recently as an outpatient for bradycardia. He has had episodes of near syncope ascribed to vertigo.  A Holter monitor was ordered for further evaluation. Today our office was notified by Cardionet that he had pauses, 4.5 seconds in duration today. The patient had previously been on Bystolic 2.5 mg daily and this was stopped 3 days ago. He has had none since then. His blood pressure has not been well controlled as he has been unable to take his anti-hypertensive meds.  Past Medical History Past Medical History  Diagnosis Date  . HTN (hypertension)   . Arthritis   . Coronary artery disease   . Hyperlipidemia     Past Surgical History Past Surgical History  Procedure Laterality Date  . Negative    . Cardiac catheterization      1996    "cleaned out artery out"....no problems since  . Tonsillectomy    . Coronary angioplasty      1996  ??? in [place], [place]  . Total knee arthroplasty  11/28/2011    Procedure: TOTAL KNEE ARTHROPLASTY;  Surgeon: [name], MD;  Location: [facility];  Service: Orthopedics;  Laterality: Left;    Allergies/Intolerances No Known Allergies  Current Home Medications      amLODipine 2.5 MG tablet  Commonly known as:  NORVASC  Take 1 tablet (2.5 mg total) by mouth daily.     aspirin EC 81 MG tablet  Take 81 mg by mouth daily.     atorvastatin 40 MG tablet  Commonly known as:  LIPITOR  Take 40 mg by mouth daily.     CO Q 10 PO  Take 1 capsule by mouth daily.     hydrochlorothiazide 25 MG tablet  Commonly known as:  HYDRODIURIL  Take 25 mg by mouth  daily.     losartan 100 MG tablet  Commonly known as:  COZAAR  Take 1 tablet (100 mg total) by mouth daily.     Magnesium 250 MG Tabs  Take 1 tablet by mouth daily.     meclizine 12.5 MG tablet  Commonly known as:  ANTIVERT  Take 1 tablet (12.5 mg total) by mouth 2 (two) times daily as needed for dizziness.     nebivolol 10 MG tablet  Commonly known as:  BYSTOLIC  Take 2.5 mg by mouth daily.     NON FORMULARY  Take 1 tablet by mouth daily. turmerictcurcumin     potassium chloride SA 20 MEQ tablet  Commonly known as:  K-DUR,KLOR-CON  Take 1 tablet (20 mEq total) by mouth daily.     Family History Family History  Problem Relation Age of Onset  . Heart attack Mother 7  . Stroke Father 13  . Stroke Sister 20  . Heart attack Brother   . Stroke Sister   . Stroke Sister   . Heart attack Brother      Social History History   Social History  . Marital Status: Married    Spouse Name: N/A    Number of Children: N/A  . Years of Education: N/A   Occupational History  . Not  on file.   Social History Main Topics  . Smoking status: Never Smoker   . Smokeless tobacco: Not on file  . Alcohol Use: 1.2 oz/week    2 Cans of beer per week  . Drug Use: No  . Sexual Activity: Not on file   Other Topics Concern  . Not on file   Social History Narrative  . No narrative on file     Review of Systems General: No chills, fever, night sweats or weight changes  Cardiovascular:  No chest pain, dyspnea on exertion, edema, orthopnea, palpitations, paroxysmal nocturnal dyspnea Dermatological: No rash, lesions or masses Respiratory: No cough, dyspnea Urologic: No hematuria, dysuria Abdominal: No nausea, vomiting, diarrhea, bright red blood per rectum, melena, or hematemesis Neurologic: No visual changes All other systems reviewed and are otherwise negative except as noted above.  Physical Exam Vitals: Blood pressure 170/67, pulse 92, temperature 98.1 F (36.7 C), temperature  source Oral, resp. rate 18, SpO2 100.00%.  General: Well developed, well appearing 80 y.o. male in no acute distress. HEENT: Normocephalic, atraumatic. EOMs intact. Sclera nonicteric. Oropharynx clear.  Neck: Supple. No JVD. Lungs: Respirations regular and unlabored, CTA bilaterally. No wheezes, rales or rhonchi. Heart: RRR. S1, S2 present. No murmurs, rub, S3. S4 is present. Abdomen: Soft, non-tender, non-distended. BS present x 4 quadrants.   Extremities: No clubbing, cyanosis or edema. DP/PT/Radials 2+ and equal bilaterally. Psych: Normal affect. Neuro: Alert and oriented X 3. Moves all extremities spontaneously. Musculoskeletal: No kyphosis. Skin: Intact. Warm and dry. No rashes or petechiae in exposed areas.   Labs Admission labs pending  Radiology/Studies No results found.  Echocardiogram March 2014 Study Conclusions - Left ventricle: The cavity size was normal. Wall thickness was normal. Systolic function was normal. The estimated ejection fraction was in the range of 55% to 60%. Wall motion was normal; there were no regional wall motion abnormalities. Doppler parameters are consistent with abnormal left ventricular relaxation (grade 1 diastolic dysfunction). - Ventricular septum: Thickness was mildly increased. Septal motion showed abnormal function and dyssynergy. - Mitral valve: Calcified annulus. Mild regurgitation. - Atrial septum: No defect or patent foramen ovale was identified.  12-lead ECG - SR with long first degree AV block and PVCs Telemetry - SR  Assessment and Plan 1.Symptomatic bradycardia with sinus pauses of 4.5 seconds 2. HTN, uncontrolled 3. CAD with preserved LV function. Rec: I have discussed the indications, risks/benefits/goals/expectations of DDD PM insertion and he wishes to proceed.  Mikle Bosworth.D.

## 2014-04-22 NOTE — Assessment & Plan Note (Signed)
4.3 sec pause captured on Holter monitor. Pt was completely asymptomatic. QT/QTc is WNL. Will continue with Holter monitor for further monitoring. Will stop Bystolic completely. Will obtain labs to check potassium and magnesium. Will also check TSH.

## 2014-04-22 NOTE — ED Provider Notes (Signed)
CSN: [id number]     Arrival date & time 04/22/14  1600 History   First MD Initiated Contact with Patient 04/22/14 1613     Chief Complaint  Patient presents with  . Irregular Heart Beat     (Consider location/radiation/quality/duration/timing/severity/associated sxs/prior Treatment) HPI Pt with history of CAD and known 1st degree AV block has been wearing Cardionet monitor for the last 2 days. He was called this morning about a 4 second pause, seen in the Cards clinic with labs ordered He was on his way home when he was called again for another 4 sec pause (both were asymptomatic) and advised to come to the ED for eval. Denies CP, SOB, dizziness or palpitations.  Past Medical History  Diagnosis Date  . HTN (hypertension)   . Arthritis   . Coronary artery disease   . Hyperlipidemia    Past Surgical History  Procedure Laterality Date  . Negative    . Cardiac catheterization      1996    "cleaned out artery out"....no problems since  . Tonsillectomy    . Coronary angioplasty      1996  ??? in [place], [place]  . Total knee arthroplasty  11/28/2011    Procedure: TOTAL KNEE ARTHROPLASTY;  Surgeon: [name], MD;  Location: [facility];  Service: Orthopedics;  Laterality: Left;   Family History  Problem Relation Age of Onset  . Heart attack Mother 37  . Stroke Father 44  . Stroke Sister 11  . Heart attack Brother   . Stroke Sister   . Stroke Sister   . Heart attack Brother    History  Substance Use Topics  . Smoking status: Never Smoker   . Smokeless tobacco: Not on file  . Alcohol Use: 1.2 oz/week    2 Cans of beer per week    Review of Systems  All other systems reviewed and are negative except as noted in HPI.    Allergies  Review of patient's allergies indicates no known allergies.  Home Medications   Prior to Admission medications   Medication Sig Start Date End Date Taking? Authorizing Provider  amLODipine (NORVASC) 2.5 MG tablet Take 1 tablet (2.5 mg total) by  mouth daily. 02/05/14   [name], MD  aspirin EC 81 MG tablet Take 81 mg by mouth daily.    Historical Provider, MD  atorvastatin (LIPITOR) 40 MG tablet Take 40 mg by mouth daily.      Historical Provider, MD  Coenzyme Q10 (CO Q 10 PO) Take 1 capsule by mouth daily.    Historical Provider, MD  hydrochlorothiazide (HYDRODIURIL) 25 MG tablet Take 25 mg by mouth daily.     Historical Provider, MD  losartan (COZAAR) 100 MG tablet Take 1 tablet (100 mg total) by mouth daily. 02/05/14   [name], MD  Magnesium 250 MG TABS Take 1 tablet by mouth daily.    Historical Provider, MD  meclizine (ANTIVERT) 12.5 MG tablet Take 1 tablet (12.5 mg total) by mouth 2 (two) times daily as needed for dizziness. 10/29/13   [name], MD  nebivolol (BYSTOLIC) 10 MG tablet Take 2.5 mg by mouth daily.     Historical Provider, MD  NON FORMULARY Take 1 tablet by mouth daily. turmerictcurcumin    Historical Provider, MD  potassium chloride SA (K-DUR,KLOR-CON) 20 MEQ tablet Take 1 tablet (20 mEq total) by mouth daily. 02/05/14   [name], MD   BP 170/67  Pulse 92  Temp(Src) 98.1 F (36.7 C) (Oral)  Resp 18  SpO2 100% Physical Exam  Nursing note and vitals reviewed. Constitutional: He is oriented to person, place, and time. He appears well-developed and well-nourished.  HENT:  Head: Normocephalic and atraumatic.  Eyes: EOM are normal. Pupils are equal, round, and reactive to light.  Neck: Normal range of motion. Neck supple.  Cardiovascular: Normal heart sounds and intact distal pulses.   Pulmonary/Chest: Effort normal and breath sounds normal.  Abdominal: Bowel sounds are normal. He exhibits no distension. There is no tenderness.  Musculoskeletal: Normal range of motion. He exhibits no edema and no tenderness.  Neurological: He is alert and oriented to person, place, and time. He has normal strength. No cranial nerve deficit or sensory deficit.  Skin: Skin is warm and dry. No rash noted.   Psychiatric: He has a normal mood and affect.    ED Course  Procedures (including critical care time) Labs Review Labs Reviewed  CBC  BASIC METABOLIC PANEL  MAGNESIUM  CBC WITH DIFFERENTIAL  TSH  T4, FREE  COMPREHENSIVE METABOLIC PANEL  I-STAT TROPOININ, ED    Imaging Review No results found.   EKG Interpretation   Date/Time:  Thursday April 22 2014 16:08:11 EDT Ventricular Rate:  88 PR Interval:    QRS Duration: 132 QT Interval:  370 QTC Calculation: 447 R Axis:   -56 Text Interpretation:  Atrial fibrillation with premature ventricular or  aberrantly conducted complexes Left ventricular hypertrophy with QRS  widening Cannot rule out Septal infarct , age undetermined Abnormal ECG  Since last tracing Atrial fibrillation NOW PRESENT Confirmed by Karle Starch   MD, Juanda Crumble [phone]) on 04/22/2014 4:30:11 PM      MDM   Final diagnoses:  None    EKG shows afib vs sinus arrythmia. Cards called on arrival as he was advised to come in by their office. Dr. Claiborne Billings is at bedside.     Charles B. Karle Starch, MD 04/22/14 [phone]

## 2014-04-22 NOTE — Patient Instructions (Addendum)
1. Stop taking your bystolic  2. Keep taking your potassium  3. Your physician recommends that you schedule a follow-up appointment in:  [place] with a PA or NP   4. Continue to take your monitor   5. Have your labs done today

## 2014-04-22 NOTE — Telephone Encounter (Signed)
Urgent EKG: Report 4.5 sec pause.  Will fax report.  Pt scheduled to be seen at 3:30pm.

## 2014-04-22 NOTE — Assessment & Plan Note (Signed)
Asymptomatic. Will stop Bystolic. Continue with holter monitor for further evaluation.

## 2014-04-22 NOTE — CV Procedure (Signed)
SURGEON:  Cristopher Peru, MD     PREPROCEDURE DIAGNOSIS:  Symptomatic Bradycardia due to intermittent complete heart block    POSTPROCEDURE DIAGNOSIS:  Same as preprocedure diagnosis     PROCEDURES:   1. Left upper extremity venography.   2. Pacemaker implantation.     INTRODUCTION: Karl Howell is a 80 y.o. male  with a history of bradycardia who presents today for pacemaker implantation.  The patient reports intermittent episodes of dizziness over the past few months.  No reversible causes have been identified.  The patient therefore presents today for pacemaker implantation.     DESCRIPTION OF PROCEDURE:  Informed written consent was obtained, and   the patient was brought to the electrophysiology lab in a fasting state.  The patient required no sedation for the procedure today.  The patients left chest was prepped and draped in the usual sterile fashion by the EP lab staff. The skin overlying the left deltopectoral region was infiltrated with lidocaine for local analgesia.  A 4-cm incision was made over the left deltopectoral region.  A left subcutaneous pacemaker pocket was fashioned using a combination of sharp and blunt dissection. Electrocautery was required to assure hemostasis.     RA/RV Lead Placement: The left cephalic vein was therefore directly visualized and cannulated.  Through the left subclavian vein, a St. Jude model 1688 52 cm (serial number [id number]) right atrial lead and a St. Jude model 1688 58 cm (serial number [id number]) right ventricular lead were advanced with fluoroscopic visualization into the right atrial appendage and right ventricular apical septal positions respectively.  Initial atrial lead P- waves measured 2.7 mV with impedance of 484 ohms and a threshold of 0.5 V at 0.5 msec.  Right ventricular lead R-waves measured 16.9 mV with an impedance of 698 ohms and a threshold of 0.6 V at 0.5 msec.  Both leads were secured to the pectoralis fascia using #2-0 silk over the  suture sleeves.   Device Placement:  The leads were then connected to a St. Jude model 5110 (serial number [id number]) dual-chamber pacemaker.  The pocket was irrigated with copious gentamicin solution.  The pacemaker was then placed into the pocket.  The pocket was then closed in 2 layers with 2.0 Vicryl suture for the subcutaneous and subcuticular layers.  Steri-Strips and a sterile dressing were then applied.  There were no early apparent complications.     CONCLUSIONS:   1. Successful implantation of a St. Jude dual-chamber pacemaker for symptomatic bradycardia due to intermittent complete heart block with 4 second pauses  2. No early apparent complications.           Cristopher Peru, MD 04/22/2014 6:31 PM

## 2014-04-22 NOTE — Progress Notes (Signed)
Patient ID: Karl Howell, male   DOB: 15-Apr-1934, 80 y.o.   MRN: [id number]    04/22/2014 Karl Howell   [birth year]  [id number]  Primary Physicia Karl Howell Jerilynn Mages, MD Primary Cardiologist: Karl Howell  HPI:  Karl Howell is a 80 year old thin appearing married African American male, father of 41, grandfather to 6 grandchildren who is a retired Administrator in the [employer]. He was a patient of Dr. Terance Howell. Dr. Gwenlyn Howell is now his primary cardiologist. His cardiac risk factor profile is a remarkable for treated hypertension and hyperlipidemia. His mother did die of myocardial infarction at age 32. He does not smoke. Drinks socially. He did have a coronary atherectomy in [place] in 1999 and has been stable since.  He was evaluated recently by Karl Kicks, NP, for evaluation for asymptomatic bradycardia. She opted to place him on a Holter monitor for further evaluation. Our office was notified by Cardionet today of a 4.3 second pause that occurred at approximately 9:43 am. Dr. Claiborne Howell was notified and instructed that the patient present to clinic for evaluation.  Karl Howell now reports to clinic. He was completely asymptomatic this am and reports being in his usual state of health. He does have vertigo and has occasional dizziness, but his is treated well with meclizine. He denies any dizziness, syncope, near syncope, palpitations, SOB, or chest discomfort this am. He said that around 9:43 am, he was getting out of bed/ starting his day. He did have a bowel movement afterwards, but denies any constipation and straining. He has not taken any of his medication this am.  Office EKG demonstrates NSR with 1st degree AV block. HR 80 bmp. QT/QTc is stable at 384/442. He is on a BB. He takes 2.5 mg of Bystolic daily. This has gradually been reduced from 10 mg. He is on no other AV nodal blocking agents.    Current Outpatient Prescriptions  Medication Sig Dispense Refill  . amLODipine (NORVASC) 2.5 MG  tablet Take 1 tablet (2.5 mg total) by mouth daily.  90 tablet  2  . aspirin EC 81 MG tablet Take 81 mg by mouth daily.      [name] atorvastatin (LIPITOR) 40 MG tablet Take 40 mg by mouth daily.        . Coenzyme Q10 (CO Q 10 PO) Take 1 capsule by mouth daily.      . hydrochlorothiazide (HYDRODIURIL) 25 MG tablet Take 25 mg by mouth daily.       [name] losartan (COZAAR) 100 MG tablet Take 1 tablet (100 mg total) by mouth daily.  90 tablet  2  . Magnesium 250 MG TABS Take 1 tablet by mouth daily.      . meclizine (ANTIVERT) 12.5 MG tablet Take 1 tablet (12.5 mg total) by mouth 2 (two) times daily as needed for dizziness.  30 tablet  0  . nebivolol (BYSTOLIC) 10 MG tablet Take 2.5 mg by mouth daily.       . NON FORMULARY Take 1 tablet by mouth daily. turmerictcurcumin      . potassium chloride SA (K-DUR,KLOR-CON) 20 MEQ tablet Take 1 tablet (20 mEq total) by mouth daily.  90 tablet  2   No current facility-administered medications for this visit.    No Known Allergies  History   Social History  . Marital Status: Married    Spouse Name: N/A    Number of Children: N/A  . Years of Education: N/A   Occupational History  . Not on  file.   Social History Main Topics  . Smoking status: Never Smoker   . Smokeless tobacco: Not on file  . Alcohol Use: 1.2 oz/week    2 Cans of beer per week  . Drug Use: No  . Sexual Activity: Not on file   Other Topics Concern  . Not on file   Social History Narrative  . No narrative on file     Review of Systems: General: negative for chills, fever, night sweats or weight changes.  Cardiovascular: negative for chest pain, dyspnea on exertion, edema, orthopnea, palpitations, paroxysmal nocturnal dyspnea or shortness of breath Dermatological: negative for rash Respiratory: negative for cough or wheezing Urologic: negative for hematuria Abdominal: negative for nausea, vomiting, diarrhea, bright red blood per rectum, melena, or hematemesis Neurologic: negative  for visual changes, syncope, or dizziness All other systems reviewed and are otherwise negative except as noted above.    Blood pressure 130/60, pulse 80, height 5\' 10"  (1.778 m), weight 162 lb (73.483 kg).  General appearance: alert, cooperative and no distress Neck: no carotid bruit and no JVD Lungs: clear to auscultation bilaterally Heart: regular rate and rhythm, S1, S2 normal, no murmur, click, rub or gallop Extremities: no LEE Pulses: 2+ and symmetric Skin: warm and dry Neurologic: Grossly normal  EKG Sinus Rhythm with 1st degree AV block. QT/QTc 384/442  ASSESSMENT AND PLAN:   Sinus pause 4.3 sec pause captured on Holter monitor. Pt was completely asymptomatic. QT/QTc is WNL. Will continue with Holter monitor for further monitoring. Will stop Bystolic completely. Will obtain labs to check potassium and magnesium. Will also check TSH.   Bradycardia Asymptomatic. Will stop Bystolic. Continue with holter monitor for further evaluation.     PLAN  See plan above. Stop BB. Check labs. Continue with Holter monitor. F/u in 1-2 weeks.    Brittainy SimmonsPA-C 04/22/2014 1:36 PM

## 2014-04-22 NOTE — Telephone Encounter (Signed)
Dr. Claiborne Billings notified and advised pt come in to see an Extender today.  Call to pt and informed of pause.  Advised to come in today per MD and pt agreed.  Appt scheduled for today at 11:30a w/ Lyda Jester, PA-C.

## 2014-04-22 NOTE — ED Notes (Signed)
Pt reports wearing a heart monitors, reports being told that his "heart has stopped" x 2 times today and told to come here for further eval. Afib on ekg. No acute distress noted, denies any cp or sob.

## 2014-04-23 ENCOUNTER — Encounter (HOSPITAL_COMMUNITY): Payer: Self-pay | Admitting: *Deleted

## 2014-04-23 ENCOUNTER — Inpatient Hospital Stay (HOSPITAL_COMMUNITY): Payer: Medicare Other

## 2014-04-23 DIAGNOSIS — I498 Other specified cardiac arrhythmias: Secondary | ICD-10-CM | POA: Diagnosis not present

## 2014-04-23 DIAGNOSIS — I442 Atrioventricular block, complete: Secondary | ICD-10-CM | POA: Diagnosis not present

## 2014-04-23 DIAGNOSIS — I10 Essential (primary) hypertension: Secondary | ICD-10-CM | POA: Diagnosis not present

## 2014-04-23 DIAGNOSIS — Z95818 Presence of other cardiac implants and grafts: Secondary | ICD-10-CM | POA: Diagnosis not present

## 2014-04-23 DIAGNOSIS — M129 Arthropathy, unspecified: Secondary | ICD-10-CM | POA: Diagnosis not present

## 2014-04-23 DIAGNOSIS — I2510 Atherosclerotic heart disease of native coronary artery without angina pectoris: Secondary | ICD-10-CM | POA: Diagnosis not present

## 2014-04-23 LAB — TSH: TSH: 1.632 u[IU]/mL (ref 0.350–4.500)

## 2014-04-23 MED ORDER — YOU HAVE A PACEMAKER BOOK
Freq: Once | Status: AC
  Administered 2014-04-23: 04:00:00
  Filled 2014-04-23: qty 1

## 2014-04-23 NOTE — Progress Notes (Signed)
Pt reported pain at incision site of a 6 on a scale of 0-10. Pt asked for a stronger pain medication than Tylenol.  Pt also expressed concern about potassium level.  Pt reports taking 40 meq of Potassium daily at home.  MD on call, Dr. Colon Flattery notified, and pain medication was ordered as well as a one time dose of 40 meq of potassium.  Will continue to monitor.

## 2014-04-23 NOTE — Progress Notes (Signed)
Pts dressing unchanged from this am. D/'d wife wife in wheelchair to private vehicle.

## 2014-04-23 NOTE — Discharge Instructions (Signed)
° ° °  Supplemental Discharge Instructions for  Pacemaker/Defibrillator Patients  Activity No heavy lifting or vigorous activity with your left/right arm for 6 to 8 weeks.  Do not raise your left/right arm above your head for one week.  Gradually raise your affected arm as drawn below.           04/25/14                              04/26/14                       04/27/14                     04/28/14  NO DRIVING for   1 week  ; you may begin driving on  02/27/61   .  WOUND CARE   Keep the wound area clean and dry.  Do not get this area wet for one week. No showers for one week; you may shower on  04/30/14   .   The tape/steri-strips on your wound will fall off; do not pull them off.  No bandage is needed on the site.  DO  NOT apply any creams, oils, or ointments to the wound area.   If you notice any drainage or discharge from the wound, any swelling or bruising at the site, or you develop a fever > 101? F after you are discharged home, call the office at once.  Special Instructions   You are still able to use cellular telephones; use the ear opposite the side where you have your pacemaker/defibrillator.  Avoid carrying your cellular phone near your device.   When traveling through airports, show security personnel your identification card to avoid being screened in the metal detectors.  Ask the security personnel to use the hand wand.   Avoid arc welding equipment, MRI testing (magnetic resonance imaging), TENS units (transcutaneous nerve stimulators).  Call the office for questions about other devices.   Avoid electrical appliances that are in poor condition or are not properly grounded.   Microwave ovens are safe to be near or to operate.

## 2014-04-23 NOTE — Discharge Summary (Signed)
ELECTROPHYSIOLOGY PROCEDURE DISCHARGE SUMMARY    Patient ID: Karl Howell,  MRN: [id number], DOB/AGE: May 25, 1934 80 y.o.  Admit date: 04/22/2014 Discharge date: 04/23/2014  Primary Care Physician: Maricela Curet, MD Primary Cardiologist: Gwenlyn Found Electrophysiologist: Cristopher Peru  Primary Discharge Diagnosis:  Symptomatic bradycardia secondary to intermittant CHB, status post pacemaker implant this admission  Secondary Discharge Diagnosis:  1.  Hypertension 2.  CAD 3. Hyperlipidemia 4.  Pre-syncope  No Known Allergies   Procedures This Admission: 1.  Implantation of a dual chamber pacemaker on 04-22-14 by Dr Lovena Le.  The patient received a STJ dual chamber pacemaker with model number 1688 right atrial and right ventricular leads.  There were no early apparent complications.  2.  CXR on 04-23-2014 demonstrated no ptx following device implantation.   Brief HPI/Hospital Course:  Karl Howell is a 80 y.o. male with a past medical history as outlined above.  He was placed on an event monitor to evaluate cause of pre-syncopal spells.  He was found to have symptomatic bradycardia with sinus pauses and complete heart block and was referred to the hospital for further evaluation. He had stopped his beta blocker 3 days prior to presentation. Risks, benefits, and alternatives to pacemaker implantation were reviewed with the patient who wished to proceed. The patient underwent implantation of a STJ dual chamber pacemaker with details as outlined above.   He was monitored on telemetry overnight which demonstrated sinus rhythm with ventricular pacing.  Left chest was without hematoma or ecchymosis.  The device was interrogated and found to be functioning normally.  CXR was obtained and demonstrated no pneumothorax status post device implantation.  Wound care, arm mobility, and restrictions were reviewed with the patient.  Dr Lovena Le examined the patient and considered them stable for discharge to home.     Discharge Vitals: Blood pressure 152/72, pulse 75, temperature 98.2 F (36.8 C), temperature source Oral, resp. rate 18, height 5\' 10"  (1.778 m), weight 159 lb 6.4 oz (72.303 kg), SpO2 98.00%.    Labs:   Lab Results  Component Value Date   WBC 5.6 04/22/2014   HGB 13.1 04/22/2014   HCT 39.6 04/22/2014   MCV 83.9 04/22/2014   PLT 121* 04/22/2014     Recent Labs Lab 04/22/14 1626  NA 139  K 3.1*  CL 98  CO2 26  BUN 13  CREATININE 1.09  CALCIUM 9.2  PROT 7.2  BILITOT 0.5  ALKPHOS 51  ALT 22  AST 24  GLUCOSE 126*    Discharge Medications:    Medication List    ASK your doctor about these medications       amLODipine 2.5 MG tablet  Commonly known as:  NORVASC  Take 1 tablet (2.5 mg total) by mouth daily.     aspirin EC 81 MG tablet  Take 81 mg by mouth daily.     atorvastatin 40 MG tablet  Commonly known as:  LIPITOR  Take 40 mg by mouth daily.     CO Q 10 PO  Take 1 capsule by mouth daily.     hydrochlorothiazide 25 MG tablet  Commonly known as:  HYDRODIURIL  Take 25 mg by mouth daily.     losartan 100 MG tablet  Commonly known as:  COZAAR  Take 1 tablet (100 mg total) by mouth daily.     Magnesium 250 MG Tabs  Take 1 tablet by mouth daily.     meclizine 12.5 MG tablet  Commonly known as:  ANTIVERT  Take 1 tablet (12.5 mg total) by mouth 2 (two) times daily as needed for dizziness.     nebivolol 10 MG tablet  Commonly known as:  BYSTOLIC  Take 2.5 mg by mouth daily.     NON FORMULARY  Take 1 tablet by mouth daily. turmerictcurcumin     potassium chloride SA 20 MEQ tablet  Commonly known as:  K-DUR,KLOR-CON  Take 1 tablet (20 mEq total) by mouth daily.        Disposition:   Future Appointments Provider Department Dept Phone   05/04/2014 11:00 AM Brittainy Ladoris [facility] [phone]   05/06/2014 10:00 AM [facility] [phone]   05/25/2014 8:30 AM [name], MD [facility] [phone]   08/10/2014 9:15 AM Evans Lance, MD [facility] [phone]       Duration of Discharge Encounter: Greater than 30 minutes including physician time.  Signed,  Mikle Bosworth.D.

## 2014-04-27 ENCOUNTER — Telehealth: Payer: Self-pay | Admitting: Cardiovascular Disease

## 2014-04-27 NOTE — Telephone Encounter (Signed)
Wants to know if he should send the heart monitor back to the company since he now has a pacemaker .Karl Howell Please call

## 2014-04-27 NOTE — Telephone Encounter (Signed)
RN informed patient that he can return monitor.Place the monitor in return package.  Patient received permanent pacemaker on 04/22/14.

## 2014-05-04 ENCOUNTER — Other Ambulatory Visit: Payer: Self-pay | Admitting: *Deleted

## 2014-05-04 ENCOUNTER — Ambulatory Visit: Payer: Medicare Other | Admitting: Cardiology

## 2014-05-04 DIAGNOSIS — I25810 Atherosclerosis of coronary artery bypass graft(s) without angina pectoris: Secondary | ICD-10-CM

## 2014-05-04 DIAGNOSIS — R001 Bradycardia, unspecified: Secondary | ICD-10-CM

## 2014-05-05 NOTE — Telephone Encounter (Signed)
Pt was called immediately, sent to hospital and had pacemaker implanted same day

## 2014-05-06 ENCOUNTER — Encounter: Payer: Self-pay | Admitting: Internal Medicine

## 2014-05-06 ENCOUNTER — Ambulatory Visit (INDEPENDENT_AMBULATORY_CARE_PROVIDER_SITE_OTHER): Payer: Medicare Other | Admitting: *Deleted

## 2014-05-06 DIAGNOSIS — I498 Other specified cardiac arrhythmias: Secondary | ICD-10-CM | POA: Diagnosis not present

## 2014-05-06 DIAGNOSIS — I455 Other specified heart block: Secondary | ICD-10-CM | POA: Diagnosis not present

## 2014-05-06 DIAGNOSIS — I442 Atrioventricular block, complete: Secondary | ICD-10-CM

## 2014-05-06 DIAGNOSIS — R001 Bradycardia, unspecified: Secondary | ICD-10-CM

## 2014-05-10 DIAGNOSIS — I10 Essential (primary) hypertension: Secondary | ICD-10-CM | POA: Diagnosis not present

## 2014-05-11 LAB — MDC_IDC_ENUM_SESS_TYPE_INCLINIC
Battery Remaining Longevity: 104.4 mo
Battery Voltage: 3.1 V
Brady Statistic RA Percent Paced: 32 %
Brady Statistic RV Percent Paced: 95 %
Date Time Interrogation Session: 20150514142922
Implantable Pulse Generator Model: 2240
Implantable Pulse Generator Serial Number: 7618528
Lead Channel Impedance Value: 387.5 Ohm
Lead Channel Impedance Value: 450 Ohm
Lead Channel Pacing Threshold Amplitude: 0.5 V
Lead Channel Pacing Threshold Amplitude: 0.5 V
Lead Channel Pacing Threshold Amplitude: 0.75 V
Lead Channel Pacing Threshold Pulse Width: 0.4 ms
Lead Channel Pacing Threshold Pulse Width: 0.4 ms
Lead Channel Pacing Threshold Pulse Width: 0.4 ms
Lead Channel Sensing Intrinsic Amplitude: 12 mV
Lead Channel Sensing Intrinsic Amplitude: 4.7 mV
Lead Channel Setting Pacing Amplitude: 1 V
Lead Channel Setting Pacing Amplitude: 3.5 V
Lead Channel Setting Pacing Pulse Width: 0.4 ms
Lead Channel Setting Sensing Sensitivity: 2 mV

## 2014-05-11 NOTE — Progress Notes (Signed)
Wound check appointment. Steri-strips removed. Wound without redness or edema. Incision edges approximated, wound well healed. Normal device function. Thresholds, sensing, and impedances consistent with implant measurements. Device programmed at 3.5V for extra safety margin until 3 month visit. Histogram distribution appropriate for patient and level of activity. 8 mode switche episodes (<1%)---max dur. 10 sec, Max A 175, Max V 72---AT. No high ventricular rates noted. 206 episodes of PMT---PVARP extended from 275-342ms. Patient educated about wound care, arm mobility, lifting restrictions. ROV on 8-18 @ 9:15am with GT.

## 2014-05-14 DIAGNOSIS — R310 Gross hematuria: Secondary | ICD-10-CM | POA: Diagnosis not present

## 2014-05-14 DIAGNOSIS — N139 Obstructive and reflux uropathy, unspecified: Secondary | ICD-10-CM | POA: Diagnosis not present

## 2014-05-14 DIAGNOSIS — R972 Elevated prostate specific antigen [PSA]: Secondary | ICD-10-CM | POA: Diagnosis not present

## 2014-05-14 DIAGNOSIS — N138 Other obstructive and reflux uropathy: Secondary | ICD-10-CM | POA: Diagnosis not present

## 2014-05-14 DIAGNOSIS — N401 Enlarged prostate with lower urinary tract symptoms: Secondary | ICD-10-CM | POA: Diagnosis not present

## 2014-05-20 ENCOUNTER — Encounter: Payer: Self-pay | Admitting: Cardiovascular Disease

## 2014-05-20 ENCOUNTER — Ambulatory Visit (INDEPENDENT_AMBULATORY_CARE_PROVIDER_SITE_OTHER): Payer: Medicare Other | Admitting: Cardiovascular Disease

## 2014-05-20 VITALS — BP 170/60 | HR 89 | Ht 69.0 in | Wt 165.6 lb

## 2014-05-20 DIAGNOSIS — I2510 Atherosclerotic heart disease of native coronary artery without angina pectoris: Secondary | ICD-10-CM

## 2014-05-20 DIAGNOSIS — I442 Atrioventricular block, complete: Secondary | ICD-10-CM

## 2014-05-20 DIAGNOSIS — I10 Essential (primary) hypertension: Secondary | ICD-10-CM

## 2014-05-20 MED ORDER — NEBIVOLOL HCL 2.5 MG PO TABS: 2.5000 mg | ORAL_TABLET | Freq: Every day | ORAL | Status: DC

## 2014-05-20 NOTE — Patient Instructions (Signed)
Dr Gwenlyn Found has made no changes in your current medications.  Dr Gwenlyn Found wants you to follow-up in 1 year or sooner if necessary. You will receive a reminder letter in the mail one months in advance. If you don't receive a letter, please call our office to schedule the follow-up appointment.

## 2014-05-20 NOTE — Assessment & Plan Note (Signed)
St. Jude Bi-V pacer working well. Continue current medications.

## 2014-05-20 NOTE — Progress Notes (Signed)
Patient ID: Karl Howell, male   DOB: 05-30-1934, 80 y.o.   MRN: [id number]    05/20/2014 [name]   1934-08-31  [id number]  Primary Physicia Maggie Font, MD Primary Cardiologist: Gwenlyn Found  HPI:  Karl Howell is a 80 year old thin appearing married African American male, father of 22, grandfather to 6 grandchildren who is a retired Administrator in the [employer]. He was a patient of Dr. Terance Ice. [name] is now his primary cardiologist. His cardiac risk factor profile is a remarkable for treated hypertension and hyperlipidemia. His mother did die of myocardial infarction at age 33. He does not smoke. Drinks socially. He did have a coronary atherectomy in [place] in 1999 and has been stable since.  In April, the patient was evaluated for asymptomatic bradycardia and placed on a Holter monitor. At the time his bystolic was stopped. He was found to have multiple pauses greater than 3 seconds on the monitor. Therefore, he was hospitalized, and Dr. Beckie Salts placed a [facility]. He was discharged the next day. Since that time, he has been seen in the device clinic on 05/06/14. The wound was checked and device interrogated. Since that time, he denies any chest pain or shortness of breath. No syncope or fatigue. He has not returned back to his regular exercise activity, but is ready to do so.     Current Outpatient Prescriptions  Medication Sig Dispense Refill  . amLODipine (NORVASC) 2.5 MG tablet Take 1 tablet (2.5 mg total) by mouth daily.  90 tablet  2  . aspirin EC 81 MG tablet Take 81 mg by mouth daily.      [name] atorvastatin (LIPITOR) 40 MG tablet Take 40 mg by mouth daily.        . hydrochlorothiazide (HYDRODIURIL) 25 MG tablet Take 25 mg by mouth daily.       [name] losartan (COZAAR) 100 MG tablet Take 1 tablet (100 mg total) by mouth daily.  90 tablet  2  . Magnesium 250 MG TABS Take 1 tablet by mouth daily.      . meclizine (ANTIVERT) 12.5 MG tablet Take 1 tablet  (12.5 mg total) by mouth 2 (two) times daily as needed for dizziness.  30 tablet  0  . nebivolol (BYSTOLIC) 2.5 MG tablet Take 2.5 mg by mouth daily.      . NON FORMULARY Take 0.5 tablets by mouth 3 (three) times a week. turmerictcurcumin      . potassium chloride SA (K-DUR,KLOR-CON) 20 MEQ tablet Take 40 mEq by mouth daily.      [name] QUERCETIN PO Take by mouth 3 (three) times a week.      [name] UBIQUINOL PO Take by mouth 3 (three) times a week.       No current facility-administered medications for this visit.    No Known Allergies  History   Social History  . Marital Status: Married    Spouse Name: N/A    Number of Children: N/A  . Years of Education: N/A   Occupational History  . Not on file.   Social History Main Topics  . Smoking status: Never Smoker   . Smokeless tobacco: Not on file  . Alcohol Use: 1.2 oz/week    2 Cans of beer per week  . Drug Use: No  . Sexual Activity: Not on file   Other Topics Concern  . Not on file   Social History Narrative  . No narrative on file  Review of Systems: General: negative for chills, fever, night sweats or weight changes.  Cardiovascular: negative for chest pain, dyspnea on exertion, edema, orthopnea, palpitations, paroxysmal nocturnal dyspnea or shortness of breath Dermatological: negative for rash Respiratory: negative for cough or wheezing Urologic: negative for hematuria Abdominal: negative for nausea, vomiting, diarrhea, bright red blood per rectum, melena, or hematemesis Neurologic: negative for visual changes, syncope, or dizziness All other systems reviewed and are otherwise negative except as noted above.    Blood pressure 170/60, pulse 89, height 5\' 9"  (1.753 m), weight 165 lb 9.6 oz (75.116 kg).  General appearance: alert, cooperative and no distress Neck: no carotid bruit and no JVD Lungs: clear to auscultation bilaterally Heart: regular rate, ventricular rhythm with occasional PVC, S1, S2 normal, no murmur,  click, rub or gallop Extremities: no LEE Pulses: 2+ and symmetric Skin: warm and dry Neurologic: Grossly normal  EKG ventriclar paced rhythm at rate of 89, occasional PVC  ASSESSMENT AND PLAN:   No problem-specific assessment & plan notes found for this encounter.   PLAN  See plan above. Cont current medication and return to normal activity. Patient to follow up with [name] in August and with [name] in 1 year.     Karl Ran, MD 05/20/2014 3:43 PM  Agree with assessment and plan by Dr. Maricela Bo. Patient is status post IV pacemaker insertion for symptomatic bradycardia and syncope. He thought he had a wound check and device check several weeks ago and is scheduled to see [name] in August. He is otherwise asymptomatic and clinically improved. I will see him back in one year.  [name], M.D., [place], [facility], [place] [address] [place], [place]  [id number]  [phone] 05/20/2014 5:55 PM

## 2014-05-20 NOTE — Assessment & Plan Note (Signed)
No chest pain, hx of atherectomy in 1998.   Last stress test 08/2010 no ischemia, low risk scan.  No need for repeat eval at this time.

## 2014-05-25 ENCOUNTER — Ambulatory Visit: Payer: Medicare Other | Admitting: Cardiovascular Disease

## 2014-08-10 ENCOUNTER — Encounter: Payer: Medicare Other | Admitting: Internal Medicine

## 2014-08-24 ENCOUNTER — Encounter: Payer: Self-pay | Admitting: Internal Medicine

## 2014-08-24 ENCOUNTER — Ambulatory Visit (INDEPENDENT_AMBULATORY_CARE_PROVIDER_SITE_OTHER): Payer: Medicare Other | Admitting: Internal Medicine

## 2014-08-24 VITALS — BP 146/84 | HR 84 | Ht 69.0 in | Wt 163.0 lb

## 2014-08-24 DIAGNOSIS — I2510 Atherosclerotic heart disease of native coronary artery without angina pectoris: Secondary | ICD-10-CM

## 2014-08-24 DIAGNOSIS — I442 Atrioventricular block, complete: Secondary | ICD-10-CM

## 2014-08-24 DIAGNOSIS — I10 Essential (primary) hypertension: Secondary | ICD-10-CM | POA: Diagnosis not present

## 2014-08-24 DIAGNOSIS — Z950 Presence of cardiac pacemaker: Secondary | ICD-10-CM | POA: Diagnosis not present

## 2014-08-24 DIAGNOSIS — I455 Other specified heart block: Secondary | ICD-10-CM

## 2014-08-24 LAB — MDC_IDC_ENUM_SESS_TYPE_INCLINIC
Battery Remaining Longevity: 122.4 mo
Battery Voltage: 3.02 V
Brady Statistic RA Percent Paced: 26 %
Brady Statistic RV Percent Paced: 95 %
Date Time Interrogation Session: 20150901130424
Implantable Pulse Generator Model: 2240
Implantable Pulse Generator Serial Number: 7618528
Lead Channel Impedance Value: 387.5 Ohm
Lead Channel Impedance Value: 462.5 Ohm
Lead Channel Pacing Threshold Amplitude: 0.5 V
Lead Channel Pacing Threshold Amplitude: 0.5 V
Lead Channel Pacing Threshold Amplitude: 0.75 V
Lead Channel Pacing Threshold Pulse Width: 0.4 ms
Lead Channel Pacing Threshold Pulse Width: 0.4 ms
Lead Channel Pacing Threshold Pulse Width: 0.4 ms
Lead Channel Sensing Intrinsic Amplitude: 12 mV
Lead Channel Sensing Intrinsic Amplitude: 3.3 mV
Lead Channel Setting Pacing Amplitude: 1 V
Lead Channel Setting Pacing Amplitude: 2 V
Lead Channel Setting Pacing Pulse Width: 0.4 ms
Lead Channel Setting Sensing Sensitivity: 2 mV

## 2014-08-24 NOTE — Progress Notes (Signed)
HPI Karl Howell returns today for followup. He is a very pleasant 80 year old man with a history of hypertension, symptomatic bradycardia due to high-grade heart block, status post permanent pacemaker insertion. He also is a history of sinus node dysfunction. The patient has coronary disease and is asymptomatic. Since his pacemaker was placed, he denies chest pain, shortness of breath, syncope, or dizziness. He is exercising regularly. No Known Allergies   Current Outpatient Prescriptions  Medication Sig Dispense Refill  . amLODipine (NORVASC) 2.5 MG tablet Take 1 tablet (2.5 mg total) by mouth daily.  90 tablet  2  . aspirin EC 81 MG tablet Take 81 mg by mouth daily.      [name] atorvastatin (LIPITOR) 40 MG tablet Take 40 mg by mouth daily.        . hydrochlorothiazide (HYDRODIURIL) 25 MG tablet Take 25 mg by mouth daily.       [name] losartan (COZAAR) 100 MG tablet Take 1 tablet (100 mg total) by mouth daily.  90 tablet  2  . Magnesium 250 MG TABS Take 1 tablet by mouth daily.      . meclizine (ANTIVERT) 12.5 MG tablet Take 1 tablet (12.5 mg total) by mouth 2 (two) times daily as needed for dizziness.  30 tablet  0  . nebivolol (BYSTOLIC) 2.5 MG tablet Take 1 tablet (2.5 mg total) by mouth daily.  90 tablet  3  . NON FORMULARY Take 0.5 tablets by mouth 3 (three) times a week. turmerictcurcumin      . potassium chloride SA (K-DUR,KLOR-CON) 20 MEQ tablet Take 40 mEq by mouth daily.      [name] QUERCETIN PO Take by mouth 3 (three) times a week.      [name] UBIQUINOL PO Take by mouth 3 (three) times a week.       No current facility-administered medications for this visit.     Past Medical History  Diagnosis Date  . HTN (hypertension)   . Arthritis   . Coronary artery disease   . Hyperlipidemia   . Symptomatic bradycardia     ROS:   All systems reviewed and negative except as noted in the HPI.   Past Surgical History  Procedure Laterality Date  . Cardiac catheterization      1996     "cleaned out artery out"....no problems since  . Tonsillectomy    . Coronary angioplasty      1996  ??? in [place], [place]  . Total knee arthroplasty  11/28/2011    Procedure: TOTAL KNEE ARTHROPLASTY;  Surgeon: [name], MD;  Location: [facility];  Service: Orthopedics;  Laterality: Left;  . Pacemaker insertion  04-22-2014    STJ dual chamber pacemaker implanted for symptomatic sinus pauses     Family History  Problem Relation Age of Onset  . Heart attack Mother 51  . Stroke Father 31  . Stroke Sister 33  . Heart attack Brother   . Stroke Sister   . Stroke Sister   . Heart attack Brother      History   Social History  . Marital Status: Married    Spouse Name: N/A    Number of Children: N/A  . Years of Education: N/A   Occupational History  . Not on file.   Social History Main Topics  . Smoking status: Never Smoker   . Smokeless tobacco: Not on file  . Alcohol Use: 1.2 oz/week    2 Cans of beer per week  .  Drug Use: No  . Sexual Activity: Not on file   Other Topics Concern  . Not on file   Social History Narrative  . No narrative on file     BP 146/84  Pulse 84  Ht 5\' 9"  (1.753 m)  Wt 163 lb (73.936 kg)  BMI 24.06 kg/m2  SpO2 99%  Physical Exam:  Well appearing 80 year old man, NAD HEENT: Unremarkable Neck:  6 cm JVD, no thyromegally Back:  No CVA tenderness Lungs:  Clear with no wheezes, rales, or rhonchi. HEART:  Regular rate rhythm, no murmurs, no rubs, no clicks Abd:  soft, positive bowel sounds, no organomegally, no rebound, no guarding Ext:  2 plus pulses, no edema, no cyanosis, no clubbing Skin:  No rashes no nodules Neuro:  CN II through XII intact, motor grossly intact   DEVICE  Normal device function.  See PaceArt for details.   Assess/Plan:

## 2014-08-24 NOTE — Assessment & Plan Note (Signed)
He denies anginal symptoms. No change in medical therapy.

## 2014-08-24 NOTE — Assessment & Plan Note (Signed)
He is status post insertion of a St. Jude dual-chamber pacemaker. We'll plan to recheck in several months.

## 2014-08-24 NOTE — Patient Instructions (Signed)
Your physician wants you to follow-up in: April 2016 with Dr. Knox Saliva will receive a reminder letter in the mail two months in advance. If you don't receive a letter, please call our office to schedule the follow-up appointment.  Remote monitoring is used to monitor your Pacemaker or ICD from home. This monitoring reduces the number of office visits required to check your device to one time per year. It allows [place] to keep an eye on the functioning of your device to ensure it is working properly. You are scheduled for a device check from home on 11/25/14. You may send your transmission at any time that day. If you have a wireless device, the transmission will be sent automatically. After your physician reviews your transmission, you will receive a postcard with your next transmission date.

## 2014-08-24 NOTE — Assessment & Plan Note (Signed)
His blood pressure today is slightly elevated. The patient states that at home, his blood pressure is always well-controlled. He has whitecoat hypertension.

## 2014-08-31 ENCOUNTER — Encounter: Payer: Self-pay | Admitting: Internal Medicine

## 2014-10-08 ENCOUNTER — Other Ambulatory Visit: Payer: Self-pay

## 2014-11-09 ENCOUNTER — Encounter: Payer: Self-pay | Admitting: Internal Medicine

## 2014-11-25 ENCOUNTER — Encounter: Payer: Self-pay | Admitting: Internal Medicine

## 2014-11-25 ENCOUNTER — Ambulatory Visit (INDEPENDENT_AMBULATORY_CARE_PROVIDER_SITE_OTHER): Payer: Medicare Other | Admitting: *Deleted

## 2014-11-25 ENCOUNTER — Telehealth: Payer: Self-pay | Admitting: Internal Medicine

## 2014-11-25 DIAGNOSIS — I442 Atrioventricular block, complete: Secondary | ICD-10-CM

## 2014-11-25 DIAGNOSIS — I455 Other specified heart block: Secondary | ICD-10-CM

## 2014-11-25 LAB — MDC_IDC_ENUM_SESS_TYPE_REMOTE
Battery Remaining Longevity: 118 mo
Battery Remaining Percentage: 95.5 %
Battery Voltage: 3.02 V
Brady Statistic AP VP Percent: 28 %
Brady Statistic AP VS Percent: 1 %
Brady Statistic AS VP Percent: 67 %
Brady Statistic AS VS Percent: 1.2 %
Brady Statistic RA Percent Paced: 22 %
Brady Statistic RV Percent Paced: 95 %
Date Time Interrogation Session: 20151203090545
Implantable Pulse Generator Model: 2240
Implantable Pulse Generator Serial Number: 7618528
Lead Channel Impedance Value: 390 Ohm
Lead Channel Impedance Value: 450 Ohm
Lead Channel Pacing Threshold Amplitude: 0.5 V
Lead Channel Pacing Threshold Amplitude: 0.875 V
Lead Channel Pacing Threshold Pulse Width: 0.4 ms
Lead Channel Pacing Threshold Pulse Width: 0.4 ms
Lead Channel Sensing Intrinsic Amplitude: 12 mV
Lead Channel Sensing Intrinsic Amplitude: 4.1 mV
Lead Channel Setting Pacing Amplitude: 1.125
Lead Channel Setting Pacing Amplitude: 2 V
Lead Channel Setting Pacing Pulse Width: 0.4 ms
Lead Channel Setting Sensing Sensitivity: 2 mV

## 2014-11-25 NOTE — Telephone Encounter (Signed)
New Msg   Pt calling concerned about pacemaker check, states he is not aware that he has to do this and wants to know what he should do. Please contact at [phone].

## 2014-11-25 NOTE — Telephone Encounter (Signed)
Lmm to let patient know we received remote transmission-- wireless pacemaker sends automatic transmission when scheduled. Instructed to call back if he has further questions.

## 2014-11-26 NOTE — Progress Notes (Signed)
Remote pacemaker transmission.   

## 2014-12-20 ENCOUNTER — Other Ambulatory Visit: Payer: Self-pay | Admitting: Cardiovascular Disease

## 2014-12-20 NOTE — Telephone Encounter (Signed)
Rx refill sent to patient pharmacy   

## 2014-12-27 ENCOUNTER — Encounter: Payer: Self-pay | Admitting: Cardiology

## 2015-01-10 ENCOUNTER — Encounter: Payer: Self-pay | Admitting: Cardiology

## 2015-01-31 ENCOUNTER — Encounter: Payer: Self-pay | Admitting: Internal Medicine

## 2015-02-03 DIAGNOSIS — R339 Retention of urine, unspecified: Secondary | ICD-10-CM | POA: Diagnosis not present

## 2015-02-03 DIAGNOSIS — R351 Nocturia: Secondary | ICD-10-CM | POA: Diagnosis not present

## 2015-02-03 DIAGNOSIS — R972 Elevated prostate specific antigen [PSA]: Secondary | ICD-10-CM | POA: Diagnosis not present

## 2015-02-03 DIAGNOSIS — N401 Enlarged prostate with lower urinary tract symptoms: Secondary | ICD-10-CM | POA: Diagnosis not present

## 2015-02-24 ENCOUNTER — Telehealth: Payer: Self-pay | Admitting: Internal Medicine

## 2015-02-24 ENCOUNTER — Ambulatory Visit (INDEPENDENT_AMBULATORY_CARE_PROVIDER_SITE_OTHER): Payer: Medicare Other | Admitting: *Deleted

## 2015-02-24 ENCOUNTER — Encounter: Payer: Self-pay | Admitting: Internal Medicine

## 2015-02-24 DIAGNOSIS — I442 Atrioventricular block, complete: Secondary | ICD-10-CM | POA: Diagnosis not present

## 2015-02-24 NOTE — Telephone Encounter (Signed)
Informed pt that transmission has been received and that if there was any issues a device tech would call him otherwise he will receive a letter. Pt verbalized understanding.

## 2015-02-24 NOTE — Progress Notes (Signed)
Remote pacemaker transmission.   

## 2015-02-24 NOTE — Telephone Encounter (Signed)
New Msg       Pt calling about remote transmission.   Has it been completed? Is everything ok?  Please return call.

## 2015-03-07 LAB — MDC_IDC_ENUM_SESS_TYPE_REMOTE
Battery Remaining Longevity: 115 mo
Battery Remaining Percentage: 95.5 %
Battery Voltage: 3.02 V
Brady Statistic AP VP Percent: 29 %
Brady Statistic AP VS Percent: 1 %
Brady Statistic AS VP Percent: 66 %
Brady Statistic AS VS Percent: 1.1 %
Brady Statistic RA Percent Paced: 24 %
Brady Statistic RV Percent Paced: 96 %
Date Time Interrogation Session: 20160303095051
Implantable Pulse Generator Model: 2240
Implantable Pulse Generator Serial Number: 7618528
Lead Channel Impedance Value: 400 Ohm
Lead Channel Impedance Value: 460 Ohm
Lead Channel Pacing Threshold Amplitude: 0.5 V
Lead Channel Pacing Threshold Amplitude: 1.125 V
Lead Channel Pacing Threshold Pulse Width: 0.4 ms
Lead Channel Pacing Threshold Pulse Width: 0.4 ms
Lead Channel Sensing Intrinsic Amplitude: 12 mV
Lead Channel Sensing Intrinsic Amplitude: 5 mV
Lead Channel Setting Pacing Amplitude: 1.375
Lead Channel Setting Pacing Amplitude: 2 V
Lead Channel Setting Pacing Pulse Width: 0.4 ms
Lead Channel Setting Sensing Sensitivity: 2 mV

## 2015-03-10 ENCOUNTER — Encounter: Payer: Self-pay | Admitting: Cardiology

## 2015-03-24 ENCOUNTER — Encounter: Payer: Self-pay | Admitting: Cardiology

## 2015-03-30 ENCOUNTER — Telehealth: Payer: Self-pay | Admitting: Internal Medicine

## 2015-03-30 NOTE — Telephone Encounter (Signed)
New Message        Pt calling to speak to device clinic about letter he received. Please call back and advise.

## 2015-03-30 NOTE — Telephone Encounter (Signed)
LMOVM for pt to return call 

## 2015-03-31 NOTE — Telephone Encounter (Signed)
LMOVM for pt to return call. 2nd attempt

## 2015-04-01 NOTE — Telephone Encounter (Signed)
Informed pt that he was due to MD and to keep June 2016 appt. Pt verbalized understanding.

## 2015-04-30 ENCOUNTER — Other Ambulatory Visit: Payer: Self-pay | Admitting: Cardiovascular Disease

## 2015-05-02 NOTE — Telephone Encounter (Signed)
Rx refill sent to patient pharmacy   

## 2015-05-06 ENCOUNTER — Encounter: Payer: Self-pay | Admitting: *Deleted

## 2015-05-09 ENCOUNTER — Other Ambulatory Visit: Payer: Self-pay | Admitting: Cardiovascular Disease

## 2015-05-09 NOTE — Telephone Encounter (Signed)
Rx(s) sent to pharmacy electronically.  

## 2015-05-13 ENCOUNTER — Telehealth: Payer: Self-pay | Admitting: Cardiovascular Disease

## 2015-05-13 ENCOUNTER — Encounter: Payer: Self-pay | Admitting: Cardiovascular Disease

## 2015-05-13 NOTE — Telephone Encounter (Signed)
Closed encounter °

## 2015-05-17 ENCOUNTER — Ambulatory Visit: Payer: TRICARE For Life (TFL) | Admitting: Cardiovascular Disease

## 2015-05-18 ENCOUNTER — Telehealth: Payer: Self-pay | Admitting: *Deleted

## 2015-05-18 MED ORDER — NEBIVOLOL HCL 2.5 MG PO TABS: 2.5000 mg | ORAL_TABLET | Freq: Every day | ORAL | Status: DC

## 2015-05-18 NOTE — Telephone Encounter (Signed)
Walk-in request for refill submitted.

## 2015-05-25 ENCOUNTER — Encounter: Payer: Self-pay | Admitting: Internal Medicine

## 2015-05-25 ENCOUNTER — Ambulatory Visit (INDEPENDENT_AMBULATORY_CARE_PROVIDER_SITE_OTHER): Payer: Medicare Other | Admitting: Internal Medicine

## 2015-05-25 VITALS — BP 152/70 | HR 96 | Ht 71.0 in | Wt 159.8 lb

## 2015-05-25 DIAGNOSIS — I10 Essential (primary) hypertension: Secondary | ICD-10-CM

## 2015-05-25 DIAGNOSIS — I480 Paroxysmal atrial fibrillation: Secondary | ICD-10-CM

## 2015-05-25 DIAGNOSIS — R001 Bradycardia, unspecified: Secondary | ICD-10-CM | POA: Diagnosis not present

## 2015-05-25 DIAGNOSIS — I2510 Atherosclerotic heart disease of native coronary artery without angina pectoris: Secondary | ICD-10-CM

## 2015-05-25 DIAGNOSIS — I2583 Coronary atherosclerosis due to lipid rich plaque: Secondary | ICD-10-CM

## 2015-05-25 DIAGNOSIS — Z950 Presence of cardiac pacemaker: Secondary | ICD-10-CM

## 2015-05-25 DIAGNOSIS — I455 Other specified heart block: Secondary | ICD-10-CM | POA: Diagnosis not present

## 2015-05-25 DIAGNOSIS — I442 Atrioventricular block, complete: Secondary | ICD-10-CM

## 2015-05-25 DIAGNOSIS — I4891 Unspecified atrial fibrillation: Secondary | ICD-10-CM | POA: Insufficient documentation

## 2015-05-25 LAB — CUP PACEART INCLINIC DEVICE CHECK
Battery Remaining Longevity: 122.4 mo
Battery Voltage: 3.01 V
Brady Statistic RA Percent Paced: 23 %
Brady Statistic RV Percent Paced: 95 %
Date Time Interrogation Session: 20160601091234
Lead Channel Impedance Value: 412.5 Ohm
Lead Channel Impedance Value: 487.5 Ohm
Lead Channel Pacing Threshold Amplitude: 0.5 V
Lead Channel Pacing Threshold Amplitude: 1 V
Lead Channel Pacing Threshold Pulse Width: 0.4 ms
Lead Channel Pacing Threshold Pulse Width: 0.4 ms
Lead Channel Sensing Intrinsic Amplitude: 12 mV
Lead Channel Sensing Intrinsic Amplitude: 5 mV
Lead Channel Setting Pacing Amplitude: 1.25 V
Lead Channel Setting Pacing Amplitude: 2 V
Lead Channel Setting Pacing Pulse Width: 0.4 ms
Lead Channel Setting Sensing Sensitivity: 2 mV
Pulse Gen Model: 2240
Pulse Gen Serial Number: 7618528

## 2015-05-25 MED ORDER — RIVAROXABAN 20 MG PO TABS: 20.0000 mg | ORAL_TABLET | Freq: Every day | ORAL | Status: DC

## 2015-05-25 NOTE — Patient Instructions (Signed)
Medication Instructions:  Your physician has recommended you make the following change in your medication:  1) STOP Aspirin 2) START Xarelto 20 mg tablets by mouth once daily with dinner   Labwork: NONE  Testing/Procedures: NONE  Follow-Up: Your physician wants you to follow-up in: 12 months with [name]. You will receive a reminder letter in the mail two months in advance. If you don't receive a letter, please call our office to schedule the follow-up appointment.  Remote monitoring is used to monitor your Pacemaker or ICD from home. This monitoring reduces the number of office visits required to check your device to one time per year. It allows [place] to keep an eye on the functioning of your device to ensure it is working properly. You are scheduled for a device check from home on 08/24/2015. You may send your transmission at any time that day. If you have a wireless device, the transmission will be sent automatically. After your physician reviews your transmission, you will receive a postcard with your next transmission date.     Any Other Special Instructions Will Be Listed Below (If Applicable).

## 2015-05-25 NOTE — Assessment & Plan Note (Signed)
His systolic blood pressure is elevated. He notes that he has been under increased stress because of problems with power lines in his yard. He will reduce his sodium intake.

## 2015-05-25 NOTE — Assessment & Plan Note (Addendum)
Interogation of his DDD PM demonstrates that he has had PAF, up to 14 hours in duration. He admits to occaisional palpitations. I discussed stroke prevention with the patient and will start Xarelto. Will stop ASA.

## 2015-05-25 NOTE — Assessment & Plan Note (Signed)
His St. Jude DDD PM is working normally. He will continue his current medications.

## 2015-05-25 NOTE — Assessment & Plan Note (Signed)
He denies anginal symptoms and remains active. No change in meds.

## 2015-05-25 NOTE — Progress Notes (Signed)
HPI Mr. Rolf returns today for followup. He is a very pleasant 81 year old man with a history of hypertension, symptomatic bradycardia due to high-grade heart block, status post permanent pacemaker insertion. He also is a history of sinus node dysfunction. The patient has coronary disease and is asymptomatic. Since his pacemaker was placed, he denies chest pain, shortness of breath, syncope, or dizziness. He is exercising regularly. He has rare palpitations.  No Known Allergies   Current Outpatient Prescriptions  Medication Sig Dispense Refill  . amLODipine (NORVASC) 2.5 MG tablet TAKE 1 TABLET (2.5 MG TOTAL) BY MOUTH DAILY. 90 tablet 2  . atorvastatin (LIPITOR) 40 MG tablet Take 40 mg by mouth daily.      . hydrochlorothiazide (HYDRODIURIL) 25 MG tablet Take 25 mg by mouth daily.     [name] losartan (COZAAR) 100 MG tablet TAKE 1 TABLET (100 MG TOTAL) BY MOUTH DAILY. 90 tablet 2  . Magnesium 250 MG TABS Take 1 tablet by mouth daily.    . meclizine (ANTIVERT) 12.5 MG tablet Take 1 tablet (12.5 mg total) by mouth 2 (two) times daily as needed for dizziness. 30 tablet 0  . nebivolol (BYSTOLIC) 2.5 MG tablet Take 1 tablet (2.5 mg total) by mouth daily. Please keep appt for future refills. 30 tablet 0  . NON FORMULARY Take 0.5 tablets by mouth 3 (three) times a week. turmerictcurcumin    . potassium chloride SA (KLOR-CON M20) 20 MEQ tablet Take 2 tablets (40 mEq total) by mouth daily. 60 tablet 0  . QUERCETIN PO Take by mouth 3 (three) times a week.    [name] UBIQUINOL PO Take by mouth 3 (three) times a week.     No current facility-administered medications for this visit.     Past Medical History  Diagnosis Date  . HTN (hypertension)   . Arthritis   . Hyperlipidemia   . Symptomatic bradycardia   . Coronary artery disease     ATHERECTOMY DONE IN [place].    ROS:   All systems reviewed and negative except as noted in the HPI.   Past Surgical History  Procedure Laterality  Date  . Cardiac catheterization      1996    "cleaned out artery out"....no problems since  . Tonsillectomy    . Coronary angioplasty      1996  ??? in [place], [place]  . Total knee arthroplasty  11/28/2011    Procedure: TOTAL KNEE ARTHROPLASTY;  Surgeon: [name], MD;  Location: [facility];  Service: Orthopedics;  Laterality: Left;  . Pacemaker insertion  04-22-2014    STJ dual chamber pacemaker implanted for symptomatic sinus pauses  . Transthoracic echocardiogram  03/11/13    EF 55% TO 60%. GRADE 1 DIASTOLIC DYSFUNCTION. VENTRICULAR SEPTUM: THICKNESS IS MILDLY INCREASED. SEPTAL MOTION SHOWED ABNORMAL FUNCTION AND DYSSYNERGY. MV: MILD REGURG.   . Stress myocardial perfusion study  09/18/10    MILD TO MODERATE PERFUSION DEFECT IN THE BASAL INFERIOR AND MID INFERIOR REGION. THIS IS CONSISTANT WITH INFARCT/SCAR. NO ISCHEMIA.  [name] Renal artery duplex  03/11/13    NORMAL.     Family History  Problem Relation Age of Onset  . Heart attack Mother 32  . Stroke Father 20  . Stroke Sister 8  . Heart attack Brother   . Stroke Sister   . Stroke Sister   . Heart attack Brother      History   Social History  . Marital Status: Married  Spouse Name: N/A  . Number of Children: N/A  . Years of Education: N/A   Occupational History  . Not on file.   Social History Main Topics  . Smoking status: Never Smoker   . Smokeless tobacco: Not on file  . Alcohol Use: 1.2 oz/week    2 Cans of beer per week  . Drug Use: No  . Sexual Activity: Not on file   Other Topics Concern  . Not on file   Social History Narrative     BP 152/70 mmHg  Pulse 96  Ht '5\' 11"'$  (1.803 m)  Wt 159 lb 12.8 oz (72.485 kg)  BMI 22.30 kg/m2  Physical Exam:  Well appearing 81 year old man, NAD HEENT: Unremarkable Neck:  6 cm JVD, no thyromegally Back:  No CVA tenderness Lungs:  Clear with no wheezes, rales, or rhonchi. HEART:  Regular rate rhythm, no murmurs, no rubs, no clicks Abd:  soft, positive bowel  sounds, no organomegally, no rebound, no guarding Ext:  2 plus pulses, no edema, no cyanosis, no clubbing Skin:  No rashes no nodules Neuro:  CN II through XII intact, motor grossly intact   DEVICE  Normal device function.  See PaceArt for details.   Assess/Plan:

## 2015-05-27 ENCOUNTER — Encounter: Payer: Self-pay | Admitting: Cardiovascular Disease

## 2015-05-27 ENCOUNTER — Ambulatory Visit (INDEPENDENT_AMBULATORY_CARE_PROVIDER_SITE_OTHER): Payer: Medicare Other | Admitting: Cardiovascular Disease

## 2015-05-27 VITALS — BP 134/80 | HR 85 | Ht 71.0 in | Wt 160.1 lb

## 2015-05-27 DIAGNOSIS — I442 Atrioventricular block, complete: Secondary | ICD-10-CM

## 2015-05-27 DIAGNOSIS — I2510 Atherosclerotic heart disease of native coronary artery without angina pectoris: Secondary | ICD-10-CM

## 2015-05-27 DIAGNOSIS — Z79899 Other long term (current) drug therapy: Secondary | ICD-10-CM | POA: Diagnosis not present

## 2015-05-27 DIAGNOSIS — Z950 Presence of cardiac pacemaker: Secondary | ICD-10-CM

## 2015-05-27 DIAGNOSIS — I2583 Coronary atherosclerosis due to lipid rich plaque: Secondary | ICD-10-CM

## 2015-05-27 DIAGNOSIS — E785 Hyperlipidemia, unspecified: Secondary | ICD-10-CM

## 2015-05-27 DIAGNOSIS — I480 Paroxysmal atrial fibrillation: Secondary | ICD-10-CM | POA: Diagnosis not present

## 2015-05-27 NOTE — Patient Instructions (Signed)
  We will see you back in follow up in 1 year with Dr Berry.  Dr Berry has ordered: A FASTING lipid profile: to be done at your convenience.  There is a [facility] lab on the first floor of this building, [address].  They are open from 8am-5pm with a lunch from 12-2.  You do not need an appointment.      

## 2015-05-27 NOTE — Assessment & Plan Note (Signed)
History of sympathetic AV block/bradycardia status post DDD permanent transvenous pacemaker insertion by [name]. This is followed quickly by [facility] annually by [name] in the office.

## 2015-05-27 NOTE — Assessment & Plan Note (Signed)
History of hyperlipidemia or torsed and 40 mg a day. We will check a lipid and liver profile

## 2015-05-27 NOTE — Assessment & Plan Note (Signed)
History of coronary artery disease status post coronary atherectomy in [place] in 1999. The patient denies chest pain or shortness of breath.

## 2015-05-27 NOTE — Assessment & Plan Note (Signed)
Interogation of his DDD PM demonstrates that he has had PAF, up to 14 hours in duration. He admits to occaisional palpitations. Stroke prevention was discussed with the patient by [name] and Xarelto was started, aspirin discontinued.

## 2015-05-27 NOTE — Progress Notes (Signed)
05/27/2015 Alvina Chou   1934/04/20  [id number]  Primary Physician Maggie Font, MD Primary Cardiologist: [name] MD Renae Gloss   HPI:  Mr. Hantz is a 81 year old thin appearing married African American male father of 50, grandfather to 6 grandchildren who is a retired Administrator in the [employer]. He was a patient of Dr. Terance Ice. I last saw him in the office 10/29/13. His cardiac risk factor profile is a remarkable for treated hypertension and hyperlipidemia. His mother did die of myocardial infarction at age 66. He does not smoke. Drinks socially. He did have a coronary atherectomy in [place] in 1999 and has been stable since. He is totally asymptomatic denying chest pain or shortness of breath. He had complete heart block with symptomatic pauses and underwent DDD permanent transvenous pacemaker insertion by [name]. Interrogation of the pacemaker has shown prolonged episodes of asymptomatic atrial fibrillation and because of this he was changed from low-dose aspirin to Xarelto oral coagulation.[name] He feels much better after pacer insertion.   Current Outpatient Prescriptions  Medication Sig Dispense Refill  . amLODipine (NORVASC) 2.5 MG tablet TAKE 1 TABLET (2.5 MG TOTAL) BY MOUTH DAILY. 90 tablet 2  . atorvastatin (LIPITOR) 40 MG tablet Take 40 mg by mouth daily.      . hydrochlorothiazide (HYDRODIURIL) 25 MG tablet Take 25 mg by mouth daily.     [name] losartan (COZAAR) 100 MG tablet TAKE 1 TABLET (100 MG TOTAL) BY MOUTH DAILY. 90 tablet 2  . Magnesium 250 MG TABS Take 1 tablet by mouth daily.    . meclizine (ANTIVERT) 12.5 MG tablet Take 1 tablet (12.5 mg total) by mouth 2 (two) times daily as needed for dizziness. 30 tablet 0  . nebivolol (BYSTOLIC) 2.5 MG tablet Take 1 tablet (2.5 mg total) by mouth daily. Please keep appt for future refills. 30 tablet 0  . NON FORMULARY Take 0.5 tablets by mouth 3 (three) times a week. turmerictcurcumin    .  potassium chloride SA (KLOR-CON M20) 20 MEQ tablet Take 2 tablets (40 mEq total) by mouth daily. 60 tablet 0  . QUERCETIN PO Take by mouth 3 (three) times a week.    . rivaroxaban (XARELTO) 20 MG TABS tablet Take 1 tablet (20 mg total) by mouth daily with supper. 90 tablet 3  . UBIQUINOL PO Take by mouth 3 (three) times a week.     No current facility-administered medications for this visit.    No Known Allergies  History   Social History  . Marital Status: Married    Spouse Name: N/A  . Number of Children: N/A  . Years of Education: N/A   Occupational History  . Not on file.   Social History Main Topics  . Smoking status: Never Smoker   . Smokeless tobacco: Not on file  . Alcohol Use: 1.2 oz/week    2 Cans of beer per week  . Drug Use: No  . Sexual Activity: Not on file   Other Topics Concern  . Not on file   Social History Narrative     Review of Systems: General: negative for chills, fever, night sweats or weight changes.  Cardiovascular: negative for chest pain, dyspnea on exertion, edema, orthopnea, palpitations, paroxysmal nocturnal dyspnea or shortness of breath Dermatological: negative for rash Respiratory: negative for cough or wheezing Urologic: negative for hematuria Abdominal: negative for nausea, vomiting, diarrhea, bright red blood per rectum, melena, or hematemesis Neurologic: negative for visual changes,  syncope, or dizziness All other systems reviewed and are otherwise negative except as noted above.    Blood pressure 134/80, pulse 85, height '5\' 11"'$  (1.803 m), weight 160 lb 1.6 oz (72.621 kg).  General appearance: alert and no distress Neck: no adenopathy, no carotid bruit, no JVD, supple, symmetrical, trachea midline and thyroid not enlarged, symmetric, no tenderness/mass/nodules Lungs: clear to auscultation bilaterally Heart: regular rate and rhythm, S1, S2 normal, no murmur, click, rub or gallop Extremities: extremities normal, atraumatic, no  cyanosis or edema  EKG ventricular paced rhythm. I personally reviewed this EKG  ASSESSMENT AND PLAN:   Atrial fibrillation Interogation of his DDD PM demonstrates that he has had PAF, up to 14 hours in duration. He admits to occaisional palpitations. Stroke prevention was discussed with the patient by [name] and Xarelto was started, aspirin discontinued.          Hyperlipidemia History of hyperlipidemia or torsed and 40 mg a day. We will check a lipid and liver profile   Atrioventricular block, complete History of sympathetic AV block/bradycardia status post DDD permanent transvenous pacemaker insertion by [name]. This is followed quickly by [facility] annually by [name] in the office.   Coronary artery disease History of coronary artery disease status post coronary atherectomy in [place] in 1999. The patient denies chest pain or shortness of breath.       [name] MD FACP,FACC,FAHA, [facility] 05/27/2015 9:14 AM

## 2015-06-01 DIAGNOSIS — Z79899 Other long term (current) drug therapy: Secondary | ICD-10-CM | POA: Diagnosis not present

## 2015-06-01 DIAGNOSIS — E785 Hyperlipidemia, unspecified: Secondary | ICD-10-CM | POA: Diagnosis not present

## 2015-06-02 LAB — HEPATIC FUNCTION PANEL
ALT: 15 U/L (ref 0–53)
AST: 22 U/L (ref 0–37)
Albumin: 3.8 g/dL (ref 3.5–5.2)
Alkaline Phosphatase: 40 U/L (ref 39–117)
Bilirubin, Direct: 0.2 mg/dL (ref 0.0–0.3)
Indirect Bilirubin: 0.4 mg/dL (ref 0.2–1.2)
Total Bilirubin: 0.6 mg/dL (ref 0.2–1.2)
Total Protein: 6.7 g/dL (ref 6.0–8.3)

## 2015-06-02 LAB — LIPID PANEL
Cholesterol: 142 mg/dL (ref 0–200)
HDL: 54 mg/dL (ref >=40)
LDL Cholesterol: 79 mg/dL (ref 0–99)
Total CHOL/HDL Ratio: 2.6 Ratio
Triglycerides: 43 mg/dL (ref <150)
VLDL: 9 mg/dL (ref 0–40)

## 2015-06-20 ENCOUNTER — Other Ambulatory Visit: Payer: Self-pay

## 2015-07-08 ENCOUNTER — Telehealth: Payer: Self-pay | Admitting: *Deleted

## 2015-07-08 ENCOUNTER — Telehealth: Payer: Self-pay | Admitting: Internal Medicine

## 2015-07-08 MED ORDER — LOSARTAN POTASSIUM 100 MG PO TABS: ORAL_TABLET | ORAL | Status: DC

## 2015-07-08 MED ORDER — NEBIVOLOL HCL 2.5 MG PO TABS: 2.5000 mg | ORAL_TABLET | Freq: Every day | ORAL | Status: DC

## 2015-07-08 MED ORDER — AMLODIPINE BESYLATE 2.5 MG PO TABS: ORAL_TABLET | ORAL | Status: DC

## 2015-07-08 NOTE — Telephone Encounter (Signed)
Need prescription sent to pharmacy for refill 90 day supply. bystolic  Amlodipine Losartan E-sent RX. Notified patient

## 2015-07-08 NOTE — Telephone Encounter (Signed)
Walk-in form received pt requesting explanation for Rx-Xarelto gave to [facility].  07/08/15  ltd

## 2015-07-25 ENCOUNTER — Telehealth: Payer: Self-pay | Admitting: Internal Medicine

## 2015-07-25 NOTE — Telephone Encounter (Signed)
lmom that with the PVC's he has and afib the most accurate way to obtain a HR is to count for a full minute at his wrist.  I have asked him to call back with any questions

## 2015-07-25 NOTE — Telephone Encounter (Signed)
New message    Pt states pulse is 37, normally runs 60-65 Pt states it is running low like it was before having pacemaker Please call before 12 or after 3

## 2015-07-25 NOTE — Telephone Encounter (Signed)
Discussed with patient the need to check his pulse radially for 1 full min.  If he is still getting the low readings after that will have him send in a transmission from home.  He verbalized understanding

## 2015-08-24 ENCOUNTER — Ambulatory Visit (INDEPENDENT_AMBULATORY_CARE_PROVIDER_SITE_OTHER): Payer: Medicare Other | Admitting: *Deleted

## 2015-08-24 DIAGNOSIS — R001 Bradycardia, unspecified: Secondary | ICD-10-CM

## 2015-08-24 NOTE — Progress Notes (Signed)
Remote pacemaker transmission.   

## 2015-09-02 LAB — CUP PACEART REMOTE DEVICE CHECK
Battery Remaining Longevity: 117 mo
Battery Remaining Percentage: 95.5 %
Battery Voltage: 3.01 V
Brady Statistic AP VP Percent: 31 %
Brady Statistic AP VS Percent: 1 %
Brady Statistic AS VP Percent: 64 %
Brady Statistic AS VS Percent: 1.4 %
Brady Statistic RA Percent Paced: 27 %
Brady Statistic RV Percent Paced: 96 %
Date Time Interrogation Session: 20160831090822
Lead Channel Impedance Value: 380 Ohm
Lead Channel Impedance Value: 450 Ohm
Lead Channel Pacing Threshold Amplitude: 0.5 V
Lead Channel Pacing Threshold Amplitude: 1 V
Lead Channel Pacing Threshold Pulse Width: 0.4 ms
Lead Channel Pacing Threshold Pulse Width: 0.4 ms
Lead Channel Sensing Intrinsic Amplitude: 12 mV
Lead Channel Sensing Intrinsic Amplitude: 4.2 mV
Lead Channel Setting Pacing Amplitude: 1.25 V
Lead Channel Setting Pacing Amplitude: 2 V
Lead Channel Setting Pacing Pulse Width: 0.4 ms
Lead Channel Setting Sensing Sensitivity: 2 mV
Pulse Gen Model: 2240
Pulse Gen Serial Number: 7618528

## 2015-09-07 ENCOUNTER — Encounter: Payer: Self-pay | Admitting: Cardiology

## 2015-09-14 ENCOUNTER — Encounter: Payer: Self-pay | Admitting: Internal Medicine

## 2015-09-23 ENCOUNTER — Encounter: Payer: Self-pay | Admitting: Cardiology

## 2015-10-18 ENCOUNTER — Other Ambulatory Visit: Payer: Self-pay | Admitting: *Deleted

## 2015-10-18 MED ORDER — HYDROCHLOROTHIAZIDE 25 MG PO TABS: 25.0000 mg | ORAL_TABLET | Freq: Every day | ORAL | Status: DC

## 2015-11-24 ENCOUNTER — Ambulatory Visit (INDEPENDENT_AMBULATORY_CARE_PROVIDER_SITE_OTHER): Payer: Medicare Other | Admitting: *Deleted

## 2015-11-24 DIAGNOSIS — I442 Atrioventricular block, complete: Secondary | ICD-10-CM

## 2015-11-25 NOTE — Progress Notes (Signed)
Remote pacemaker transmission.   

## 2015-12-05 LAB — CUP PACEART REMOTE DEVICE CHECK
Battery Remaining Longevity: 116 mo
Battery Remaining Percentage: 95.5 %
Battery Voltage: 3.01 V
Brady Statistic AP VP Percent: 35 %
Brady Statistic AP VS Percent: 1 %
Brady Statistic AS VP Percent: 61 %
Brady Statistic AS VS Percent: 1.4 %
Brady Statistic RA Percent Paced: 30 %
Brady Statistic RV Percent Paced: 96 %
Date Time Interrogation Session: 20161201070011
Implantable Lead Implant Date: 20150430
Implantable Lead Implant Date: 20150430
Implantable Lead Location: 753859
Implantable Lead Location: 753860
Lead Channel Impedance Value: 380 Ohm
Lead Channel Impedance Value: 450 Ohm
Lead Channel Pacing Threshold Amplitude: 0.5 V
Lead Channel Pacing Threshold Amplitude: 1 V
Lead Channel Pacing Threshold Pulse Width: 0.4 ms
Lead Channel Pacing Threshold Pulse Width: 0.4 ms
Lead Channel Sensing Intrinsic Amplitude: 12 mV
Lead Channel Sensing Intrinsic Amplitude: 3.9 mV
Lead Channel Setting Pacing Amplitude: 1.25 V
Lead Channel Setting Pacing Amplitude: 2 V
Lead Channel Setting Pacing Pulse Width: 0.4 ms
Lead Channel Setting Sensing Sensitivity: 2 mV
Pulse Gen Model: 2240
Pulse Gen Serial Number: 7618528

## 2015-12-06 ENCOUNTER — Encounter: Payer: Self-pay | Admitting: Cardiology

## 2015-12-12 ENCOUNTER — Telehealth: Payer: Self-pay | Admitting: Internal Medicine

## 2015-12-12 NOTE — Telephone Encounter (Signed)
Spoke with patient and the bleeding has subsided.  He was just concerned that it took as long as it did for the bleeding to slow down.  I have asked that if the bleeding starts again to give [place] a call back

## 2015-12-12 NOTE — Telephone Encounter (Signed)
New message      Pt had 2 teeth extracted a week ago.  He did not stop his xarelto.  He is still having some bleeding.  Please advise

## 2015-12-14 ENCOUNTER — Emergency Department (HOSPITAL_COMMUNITY)
Admission: EM | Admit: 2015-12-14 | Discharge: 2015-12-14 | Disposition: A | Discharge status: Home / Self Care | Payer: Medicare Other | Attending: Emergency Medicine | Admitting: Emergency Medicine

## 2015-12-14 ENCOUNTER — Encounter (HOSPITAL_COMMUNITY): Payer: Self-pay | Admitting: *Deleted

## 2015-12-14 DIAGNOSIS — K91840 Postprocedural hemorrhage and hematoma of a digestive system organ or structure following a digestive system procedure: Secondary | ICD-10-CM | POA: Insufficient documentation

## 2015-12-14 DIAGNOSIS — Z79899 Other long term (current) drug therapy: Secondary | ICD-10-CM | POA: Diagnosis not present

## 2015-12-14 DIAGNOSIS — I10 Essential (primary) hypertension: Secondary | ICD-10-CM | POA: Diagnosis not present

## 2015-12-14 DIAGNOSIS — Z9861 Coronary angioplasty status: Secondary | ICD-10-CM | POA: Insufficient documentation

## 2015-12-14 DIAGNOSIS — Z8739 Personal history of other diseases of the musculoskeletal system and connective tissue: Secondary | ICD-10-CM | POA: Diagnosis not present

## 2015-12-14 DIAGNOSIS — Z9889 Other specified postprocedural states: Secondary | ICD-10-CM | POA: Insufficient documentation

## 2015-12-14 DIAGNOSIS — I2510 Atherosclerotic heart disease of native coronary artery without angina pectoris: Secondary | ICD-10-CM | POA: Insufficient documentation

## 2015-12-14 DIAGNOSIS — D68318 Other hemorrhagic disorder due to intrinsic circulating anticoagulants, antibodies, or inhibitors: Secondary | ICD-10-CM | POA: Diagnosis not present

## 2015-12-14 DIAGNOSIS — Z950 Presence of cardiac pacemaker: Secondary | ICD-10-CM | POA: Diagnosis not present

## 2015-12-14 DIAGNOSIS — K1379 Other lesions of oral mucosa: Secondary | ICD-10-CM | POA: Diagnosis not present

## 2015-12-14 DIAGNOSIS — E785 Hyperlipidemia, unspecified: Secondary | ICD-10-CM | POA: Insufficient documentation

## 2015-12-14 DIAGNOSIS — Z7901 Long term (current) use of anticoagulants: Secondary | ICD-10-CM | POA: Diagnosis not present

## 2015-12-14 MED ORDER — SILVER NITRATE-POT NITRATE 75-25 % EX MISC
CUTANEOUS | Status: AC
  Filled 2015-12-14: qty 2

## 2015-12-14 MED ORDER — "THROMBI-PAD 3""X3"" EX PADS"
1.0000 | MEDICATED_PAD | Freq: Once | CUTANEOUS | Status: DC
  Filled 2015-12-14: qty 1

## 2015-12-14 NOTE — ED Notes (Signed)
Pt had a few teeth extracted x 10 days ago and tonight he woke up with bleeding

## 2015-12-14 NOTE — ED Notes (Signed)
Dr. Roxanne Mins at bedside placing Thrombi pad to bleeding oral area.

## 2015-12-14 NOTE — Discharge Instructions (Signed)
Keep pressure on the site all day today. Return if there are any problems.

## 2015-12-14 NOTE — ED Provider Notes (Signed)
CSN: [id number]     Arrival date & time 12/14/15  0114 History   First MD Initiated Contact with Patient 12/14/15 0126     Chief Complaint  Patient presents with  . Bleeding/Bruising     (Consider location/radiation/quality/duration/timing/severity/associated sxs/prior Treatment) The history is provided by the patient.   81 year old male had 2 teeth extracted 10 days ago and has been having ongoing bleeding from one of the extraction sites. States that the bleeding has been intermittent but has never stopped completely. Of note, he takes rivaroxaban because of history of atrial fibrillation. He is not on any antiplatelet agents.  Past Medical History  Diagnosis Date  . HTN (hypertension)   . Arthritis   . Hyperlipidemia   . Symptomatic bradycardia     status post permanent transvenous pacemaker insertion by [name]  . Coronary artery disease     ATHERECTOMY DONE IN [place].  . Paroxysmal atrial fibrillation    Past Surgical History  Procedure Laterality Date  . Cardiac catheterization      1996    "cleaned out artery out"....no problems since  . Tonsillectomy    . Coronary angioplasty      1996  ??? in [place], [place]  . Total knee arthroplasty  11/28/2011    Procedure: TOTAL KNEE ARTHROPLASTY;  Surgeon: [name], MD;  Location: [facility];  Service: Orthopedics;  Laterality: Left;  . Pacemaker insertion  04-22-2014    STJ dual chamber pacemaker implanted for symptomatic sinus pauses  . Transthoracic echocardiogram  03/11/13    EF 55% TO 60%. GRADE 1 DIASTOLIC DYSFUNCTION. VENTRICULAR SEPTUM: THICKNESS IS MILDLY INCREASED. SEPTAL MOTION SHOWED ABNORMAL FUNCTION AND DYSSYNERGY. MV: MILD REGURG.   . Stress myocardial perfusion study  09/18/10    MILD TO MODERATE PERFUSION DEFECT IN THE BASAL INFERIOR AND MID INFERIOR REGION. THIS IS CONSISTANT WITH INFARCT/SCAR. NO ISCHEMIA.  [name] Renal artery duplex  03/11/13    NORMAL.   Family History  Problem Relation Age of Onset   . Heart attack Mother 77  . Stroke Father 57  . Stroke Sister 65  . Heart attack Brother   . Stroke Sister   . Stroke Sister   . Heart attack Brother    Social History  Substance Use Topics  . Smoking status: Never Smoker   . Smokeless tobacco: Not on file  . Alcohol Use: 1.2 oz/week    2 Cans of beer per week    Review of Systems  All other systems reviewed and are negative.     Allergies  Review of patient's allergies indicates no known allergies.  Home Medications   Prior to Admission medications   Medication Sig Start Date End Date Taking? Authorizing Provider  amLODipine (NORVASC) 2.5 MG tablet TAKE 1 TABLET (2.5 MG TOTAL) BY MOUTH DAILY. 07/08/15   [name], MD  atorvastatin (LIPITOR) 40 MG tablet Take 40 mg by mouth daily.      Historical Provider, MD  hydrochlorothiazide (HYDRODIURIL) 25 MG tablet Take 1 tablet (25 mg total) by mouth daily. 10/18/15   [name], MD  losartan (COZAAR) 100 MG tablet TAKE 1 TABLET (100 MG TOTAL) BY MOUTH DAILY. 07/08/15   [name], MD  Magnesium 250 MG TABS Take 1 tablet by mouth daily.    Historical Provider, MD  meclizine (ANTIVERT) 12.5 MG tablet Take 1 tablet (12.5 mg total) by mouth 2 (two) times daily as needed for dizziness. 10/29/13   Pearletha Forge  Gwenlyn Found, MD  nebivolol (BYSTOLIC) 2.5 MG tablet Take 1 tablet (2.5 mg total) by mouth daily. 07/08/15   [name], MD  NON FORMULARY Take 0.5 tablets by mouth 3 (three) times a week. turmerictcurcumin    Historical Provider, MD  potassium chloride SA (KLOR-CON M20) 20 MEQ tablet Take 2 tablets (40 mEq total) by mouth daily. 05/09/15   [name], MD  QUERCETIN PO Take by mouth 3 (three) times a week.    Historical Provider, MD  rivaroxaban (XARELTO) 20 MG TABS tablet Take 1 tablet (20 mg total) by mouth daily with supper. 05/25/15   Evans Lance, MD  UBIQUINOL PO Take by mouth 3 (three) times a week.    Historical Provider, MD   BP 164/100 mmHg  Pulse 87   Temp(Src) 97.9 F (36.6 C) (Oral)  Resp 20  SpO2 100% Physical Exam  Nursing note and vitals reviewed.  81 year old male, resting comfortably and in no acute distress. Vital signs are significant for hypertension. Oxygen saturation is 100%, which is normal. Head is normocephalic and atraumatic. PERRLA, EOMI. Oropharynx is clear. Teeth #5 and 6 have been extracted with some slight bleeding coming from the socket for tooth #6. Neck is nontender and supple without adenopathy or JVD. Back is nontender and there is no CVA tenderness. Lungs are clear without rales, wheezes, or rhonchi. Chest is nontender. Heart has an irregular rhythm without murmur. Abdomen is soft, flat, nontender without masses or hepatosplenomegaly and peristalsis is normoactive. Extremities have no cyanosis or edema, full range of motion is present. Skin is warm and dry without rash. Neurologic: Mental status is normal, cranial nerves are intact, there are no motor or sensory deficits.  ED Course  Procedures (including critical care time)  MDM   Final diagnoses:  Oral bleeding - bleeding from the socket from recent dental extraction   Anticoagulant long-term use    Bleeding from socket from extracted tooth secondary to anticoagulant use. Old records are reviewed confirming anticoagulation for paroxysmal atrial fibrillation.  Attempt to stop bleeding was made using thrombin pad but it was not effective. It was noted that there was a clot present which was protecting the bleeding site. Clot was removed and thrombin pad was applied again with, initially, what seemed to be control of bleeding, but then had recurrence of bleeding. Following this, Surgicel was packed into the socket and pressure was applied with good control bleeding. He was observed for 45 minutes with no recurrence of bleeding and is discharged.  Delora Fuel, MD 75/17/00 1749

## 2015-12-20 ENCOUNTER — Encounter: Payer: Self-pay | Admitting: Cardiology

## 2015-12-30 DIAGNOSIS — N138 Other obstructive and reflux uropathy: Secondary | ICD-10-CM | POA: Diagnosis not present

## 2015-12-30 DIAGNOSIS — R972 Elevated prostate specific antigen [PSA]: Secondary | ICD-10-CM | POA: Diagnosis not present

## 2015-12-30 DIAGNOSIS — Z0000 Encounter for general adult medical examination without abnormal findings: Secondary | ICD-10-CM | POA: Diagnosis not present

## 2015-12-30 DIAGNOSIS — N5201 Erectile dysfunction due to arterial insufficiency: Secondary | ICD-10-CM | POA: Diagnosis not present

## 2015-12-30 DIAGNOSIS — N401 Enlarged prostate with lower urinary tract symptoms: Secondary | ICD-10-CM | POA: Diagnosis not present

## 2016-02-23 ENCOUNTER — Telehealth: Payer: Self-pay | Admitting: Internal Medicine

## 2016-02-23 ENCOUNTER — Ambulatory Visit (INDEPENDENT_AMBULATORY_CARE_PROVIDER_SITE_OTHER): Payer: Medicare Other | Admitting: *Deleted

## 2016-02-23 DIAGNOSIS — I442 Atrioventricular block, complete: Secondary | ICD-10-CM

## 2016-02-23 NOTE — Progress Notes (Signed)
Remote pacemaker transmission.   

## 2016-02-23 NOTE — Telephone Encounter (Signed)
Pt wants to know if you received his transmission. Please let him know asap,he is leaving town.

## 2016-02-23 NOTE — Telephone Encounter (Signed)
Spoke w/ pt and informed him that we did receive his transmission.

## 2016-03-03 LAB — CUP PACEART REMOTE DEVICE CHECK
Battery Remaining Longevity: 115 mo
Battery Remaining Percentage: 95.5 %
Battery Voltage: 3.01 V
Brady Statistic AP VP Percent: 36 %
Brady Statistic AP VS Percent: 1 %
Brady Statistic AS VP Percent: 59 %
Brady Statistic AS VS Percent: 1.4 %
Brady Statistic RA Percent Paced: 32 %
Brady Statistic RV Percent Paced: 95 %
Date Time Interrogation Session: 20170302070013
Implantable Lead Implant Date: 20150430
Implantable Lead Implant Date: 20150430
Implantable Lead Location: 753859
Implantable Lead Location: 753860
Lead Channel Impedance Value: 390 Ohm
Lead Channel Impedance Value: 450 Ohm
Lead Channel Pacing Threshold Amplitude: 0.5 V
Lead Channel Pacing Threshold Amplitude: 1.125 V
Lead Channel Pacing Threshold Pulse Width: 0.4 ms
Lead Channel Pacing Threshold Pulse Width: 0.4 ms
Lead Channel Sensing Intrinsic Amplitude: 12 mV
Lead Channel Sensing Intrinsic Amplitude: 4.3 mV
Lead Channel Setting Pacing Amplitude: 1.375
Lead Channel Setting Pacing Amplitude: 2 V
Lead Channel Setting Pacing Pulse Width: 0.4 ms
Lead Channel Setting Sensing Sensitivity: 2 mV
Pulse Gen Model: 2240
Pulse Gen Serial Number: 7618528

## 2016-03-03 NOTE — Progress Notes (Signed)
Normal remote reviewed. +Xarelto  Next follow up 05/2016 in clinic

## 2016-03-07 ENCOUNTER — Encounter: Payer: Self-pay | Admitting: Cardiology

## 2016-03-21 ENCOUNTER — Encounter: Payer: Self-pay | Admitting: Cardiology

## 2016-04-05 ENCOUNTER — Other Ambulatory Visit: Payer: Self-pay | Admitting: Internal Medicine

## 2016-04-18 ENCOUNTER — Emergency Department (HOSPITAL_COMMUNITY): Payer: Medicare Other

## 2016-04-18 ENCOUNTER — Emergency Department (HOSPITAL_COMMUNITY)
Admission: EM | Admit: 2016-04-18 | Discharge: 2016-04-18 | Disposition: A | Discharge status: Home / Self Care | Payer: Medicare Other | Attending: Emergency Medicine | Admitting: Emergency Medicine

## 2016-04-18 ENCOUNTER — Encounter (HOSPITAL_COMMUNITY): Payer: Self-pay | Admitting: *Deleted

## 2016-04-18 DIAGNOSIS — I10 Essential (primary) hypertension: Secondary | ICD-10-CM | POA: Insufficient documentation

## 2016-04-18 DIAGNOSIS — R05 Cough: Secondary | ICD-10-CM | POA: Diagnosis not present

## 2016-04-18 DIAGNOSIS — M1990 Unspecified osteoarthritis, unspecified site: Secondary | ICD-10-CM | POA: Insufficient documentation

## 2016-04-18 DIAGNOSIS — I2510 Atherosclerotic heart disease of native coronary artery without angina pectoris: Secondary | ICD-10-CM | POA: Insufficient documentation

## 2016-04-18 DIAGNOSIS — E785 Hyperlipidemia, unspecified: Secondary | ICD-10-CM | POA: Insufficient documentation

## 2016-04-18 DIAGNOSIS — J40 Bronchitis, not specified as acute or chronic: Secondary | ICD-10-CM | POA: Diagnosis not present

## 2016-04-18 DIAGNOSIS — R0602 Shortness of breath: Secondary | ICD-10-CM | POA: Diagnosis not present

## 2016-04-18 LAB — CBC WITH DIFFERENTIAL/PLATELET
Basophils Absolute: 0 10*3/uL (ref 0.0–0.1)
Basophils Relative: 1 %
Eosinophils Absolute: 0.1 10*3/uL (ref 0.0–0.7)
Eosinophils Relative: 3 %
HCT: 37.4 % — ABNORMAL LOW (ref 39.0–52.0)
Hemoglobin: 12.1 g/dL — ABNORMAL LOW (ref 13.0–17.0)
Lymphocytes Relative: 19 %
Lymphs Abs: 0.8 10*3/uL (ref 0.7–4.0)
MCH: 26.8 pg (ref 26.0–34.0)
MCHC: 32.4 g/dL (ref 30.0–36.0)
MCV: 82.9 fL (ref 78.0–100.0)
Monocytes Absolute: 0.3 10*3/uL (ref 0.1–1.0)
Monocytes Relative: 6 %
Neutro Abs: 3.2 10*3/uL (ref 1.7–7.7)
Neutrophils Relative %: 71 %
Platelets: 106 10*3/uL — ABNORMAL LOW (ref 150–400)
RBC: 4.51 MIL/uL (ref 4.22–5.81)
RDW: 13.8 % (ref 11.5–15.5)
WBC: 4.4 10*3/uL (ref 4.0–10.5)

## 2016-04-18 LAB — URINALYSIS, ROUTINE W REFLEX MICROSCOPIC
Bilirubin Urine: NEGATIVE
Glucose, UA: NEGATIVE mg/dL
Hgb urine dipstick: NEGATIVE
Ketones, ur: NEGATIVE mg/dL
Leukocytes, UA: NEGATIVE
Nitrite: NEGATIVE
Protein, ur: NEGATIVE mg/dL
Specific Gravity, Urine: 1.01 (ref 1.005–1.030)
pH: 6 (ref 5.0–8.0)

## 2016-04-18 LAB — COMPREHENSIVE METABOLIC PANEL
ALT: 30 U/L (ref 17–63)
AST: 34 U/L (ref 15–41)
Albumin: 4.1 g/dL (ref 3.5–5.0)
Alkaline Phosphatase: 45 U/L (ref 38–126)
Anion gap: 9 (ref 5–15)
BUN: 13 mg/dL (ref 6–20)
CO2: 29 mmol/L (ref 22–32)
Calcium: 8.9 mg/dL (ref 8.9–10.3)
Chloride: 96 mmol/L — ABNORMAL LOW (ref 101–111)
Creatinine, Ser: 1.06 mg/dL (ref 0.61–1.24)
GFR calc Af Amer: >60 mL/min (ref >60)
GFR calc non Af Amer: >60 mL/min (ref >60)
Glucose, Bld: 113 mg/dL — ABNORMAL HIGH (ref 65–99)
Potassium: 3.4 mmol/L — ABNORMAL LOW (ref 3.5–5.1)
Sodium: 134 mmol/L — ABNORMAL LOW (ref 135–145)
Total Bilirubin: 1 mg/dL (ref 0.3–1.2)
Total Protein: 7.3 g/dL (ref 6.5–8.1)

## 2016-04-18 LAB — BRAIN NATRIURETIC PEPTIDE: B Natriuretic Peptide: 343 pg/mL — ABNORMAL HIGH (ref 0.0–100.0)

## 2016-04-18 LAB — I-STAT TROPONIN, ED: Troponin i, poc: 0.01 ng/mL (ref 0.00–0.08)

## 2016-04-18 MED ORDER — LEVOFLOXACIN 500 MG PO TABS: 500.0000 mg | ORAL_TABLET | Freq: Every day | ORAL | Status: DC

## 2016-04-18 MED ORDER — LEVOFLOXACIN 500 MG PO TABS
500.0000 mg | ORAL_TABLET | Freq: Once | ORAL | Status: AC
  Administered 2016-04-18: 500 mg via ORAL
  Filled 2016-04-18: qty 1

## 2016-04-18 MED ORDER — ACETAMINOPHEN 325 MG PO TABS
650.0000 mg | ORAL_TABLET | Freq: Once | ORAL | Status: AC
  Administered 2016-04-18: 650 mg via ORAL
  Filled 2016-04-18: qty 2

## 2016-04-18 NOTE — ED Provider Notes (Signed)
CSN: [id number]     Arrival date & time 04/18/16  1609 History   First MD Initiated Contact with Patient 04/18/16 1620     Chief Complaint  Patient presents with  . Cough     (Consider location/radiation/quality/duration/timing/severity/associated sxs/prior Treatment) HPI Patient presents with 5 days of cough. At times productive of yellow sputum. Patient states he had a fever up to 101 last night. He also admits to some shortness of breath with exertion though denies any shortness of breath currently. No chills or night sweats. No new lower extremity swelling or pain. Patient states he had some upper abdominal discomfort with coughing Past Medical History  Diagnosis Date  . HTN (hypertension)   . Arthritis   . Hyperlipidemia   . Symptomatic bradycardia     status post permanent transvenous pacemaker insertion by [name]  . Coronary artery disease     ATHERECTOMY DONE IN [place].  . Paroxysmal atrial fibrillation [facility])    Past Surgical History  Procedure Laterality Date  . Tonsillectomy    . Total knee arthroplasty  11/28/2011    Procedure: TOTAL KNEE ARTHROPLASTY;  Surgeon: [name], MD;  Location: [facility];  Service: Orthopedics;  Laterality: Left;  . Pacemaker insertion  04-22-2014    STJ dual chamber pacemaker implanted for symptomatic sinus pauses  . Transthoracic echocardiogram  03/11/13    EF 55% TO 60%. GRADE 1 DIASTOLIC DYSFUNCTION. VENTRICULAR SEPTUM: THICKNESS IS MILDLY INCREASED. SEPTAL MOTION SHOWED ABNORMAL FUNCTION AND DYSSYNERGY. MV: MILD REGURG.   . Stress myocardial perfusion study  09/18/10    MILD TO MODERATE PERFUSION DEFECT IN THE BASAL INFERIOR AND MID INFERIOR REGION. THIS IS CONSISTANT WITH INFARCT/SCAR. NO ISCHEMIA.  [name] Renal artery duplex  03/11/13    NORMAL.  . Multiple tooth extractions    . Cardiac catheterization      1996    "cleaned out artery out"....no problems since  . Coronary angioplasty      1996  ??? in [place], [place]   Family  History  Problem Relation Age of Onset  . Heart attack Mother 77  . Stroke Father 89  . Stroke Sister 48  . Heart attack Brother   . Stroke Sister   . Stroke Sister   . Heart attack Brother    Social History  Substance Use Topics  . Smoking status: Never Smoker   . Smokeless tobacco: None  . Alcohol Use: 1.2 oz/week    2 Cans of beer per week     Comment: occasionally    Review of Systems  Constitutional: Positive for fever. Negative for chills, activity change, appetite change and fatigue.  HENT: Positive for congestion and rhinorrhea. Negative for sore throat.   Respiratory: Positive for cough and shortness of breath. Negative for wheezing.   Cardiovascular: Negative for chest pain, palpitations and leg swelling.  Gastrointestinal: Positive for abdominal pain. Negative for nausea, vomiting, diarrhea and constipation.  Musculoskeletal: Negative for myalgias, back pain, neck pain and neck stiffness.  Skin: Negative for rash and wound.  Neurological: Negative for dizziness, weakness, light-headedness, numbness and headaches.  All other systems reviewed and are negative.     Allergies  Review of patient's allergies indicates no known allergies.  Home Medications   Prior to Admission medications   Medication Sig Start Date End Date Taking? Authorizing Provider  amLODipine (NORVASC) 2.5 MG tablet TAKE 1 TABLET (2.5 MG TOTAL) BY MOUTH DAILY. 07/08/15   [name], MD  atorvastatin (  LIPITOR) 40 MG tablet Take 40 mg by mouth daily.      Historical Provider, MD  hydrochlorothiazide (HYDRODIURIL) 25 MG tablet Take 1 tablet (25 mg total) by mouth daily. 10/18/15   [name], MD  levofloxacin (LEVAQUIN) 500 MG tablet Take 1 tablet (500 mg total) by mouth daily. 04/19/16   Julianne Rice, MD  losartan (COZAAR) 100 MG tablet TAKE 1 TABLET (100 MG TOTAL) BY MOUTH DAILY. 07/08/15   [name], MD  Magnesium 250 MG TABS Take 1 tablet by mouth daily.    Historical  Provider, MD  nebivolol (BYSTOLIC) 2.5 MG tablet Take 1 tablet (2.5 mg total) by mouth daily. 07/08/15   [name], MD  NON FORMULARY Take 0.5 tablets by mouth 3 (three) times a week. turmerictcurcumin    Historical Provider, MD  potassium chloride SA (KLOR-CON M20) 20 MEQ tablet Take 2 tablets (40 mEq total) by mouth daily. 05/09/15   [name], MD  QUERCETIN PO Take by mouth 3 (three) times a week.    Historical Provider, MD  UBIQUINOL PO Take by mouth 3 (three) times a week.    Historical Provider, MD  XARELTO 20 MG TABS tablet TAKE 1 TABLET BY MOUTH DAILY WITH SUPPER 04/06/16   Evans Lance, MD   BP 143/68 mmHg  Pulse 49  Temp(Src) 101.7 F (38.7 C) (Oral)  Resp 30  Ht '5\' 11"'$  (1.803 m)  Wt 160 lb (72.576 kg)  BMI 22.33 kg/m2  SpO2 97% Physical Exam  Constitutional: He is oriented to person, place, and time. He appears well-developed and well-nourished. No distress.  HENT:  Head: Normocephalic and atraumatic.  Mouth/Throat: Oropharynx is clear and moist. No oropharyngeal exudate.  Eyes: EOM are normal. Pupils are equal, round, and reactive to light.  Neck: Normal range of motion. Neck supple. No JVD present.  Cardiovascular: Normal rate and regular rhythm.   Pulmonary/Chest: Effort normal and breath sounds normal. No respiratory distress. He has no wheezes. He has no rales.  Abdominal: Soft. Bowel sounds are normal. He exhibits no distension and no mass. There is tenderness (mild epigastric tenderness to palpation.). There is no rebound and no guarding.  Musculoskeletal: Normal range of motion. He exhibits no edema or tenderness.  No CVA tenderness bilaterally. No lower extremity swelling, asymmetry or tenderness. Distal pulses are equal and intact.  Neurological: He is alert and oriented to person, place, and time.  Moves all extremities without deficit. Sensation is full intact. Ambulating without difficulty.  Skin: Skin is warm and dry. No rash noted. No erythema.   Psychiatric: He has a normal mood and affect. His behavior is normal.  Nursing note and vitals reviewed.   ED Course  Procedures (including critical care time) Labs Review Labs Reviewed  CBC WITH DIFFERENTIAL/PLATELET - Abnormal; Notable for the following:    Hemoglobin 12.1 (*)    HCT 37.4 (*)    Platelets 106 (*)    All other components within normal limits  COMPREHENSIVE METABOLIC PANEL - Abnormal; Notable for the following:    Sodium 134 (*)    Potassium 3.4 (*)    Chloride 96 (*)    Glucose, Bld 113 (*)    All other components within normal limits  BRAIN NATRIURETIC PEPTIDE - Abnormal; Notable for the following:    B Natriuretic Peptide 343.0 (*)    All other components within normal limits  URINALYSIS, ROUTINE W REFLEX MICROSCOPIC (NOT AT [facility])  Randolm Idol, ED  Imaging Review Dg Chest 2 View  04/18/2016  CLINICAL DATA:  Shortness of breath, productive cough. EXAM: CHEST  2 VIEW COMPARISON:  Apr 23, 2014. FINDINGS: The heart size and mediastinal contours are within normal limits. Both lungs are clear. Left-sided pacemaker is unchanged in position. No pneumothorax or pleural effusion is noted. The visualized skeletal structures are unremarkable. IMPRESSION: No active cardiopulmonary disease. Electronically Signed   By: Marijo Conception, M.D.   On: 04/18/2016 17:21   I have personally reviewed and evaluated these images and lab results as part of my medical decision-making.   EKG Interpretation   Date/Time:  Wednesday April 18 2016 16:41:30 EDT Ventricular Rate:  101 PR Interval:    QRS Duration: 129 QT Interval:  380 QTC Calculation: 493 R Axis:   -52 Text Interpretation:  Ventricular-paced complexes No further analysis  attempted due to paced rhythm Confirmed by Lita Mains  MD, Linas Stepter ([id number]) on  04/18/2016 6:32:52 PM      MDM   Final diagnoses:  Bronchitis    Patient states the well-appearing. Fever in the emergency department treated with Tylenol.  Suspect bronchitis versus early pneumonia. Start on antibiotic and advised to follow-up with his primary physician. Return precautions have been given.  Julianne Rice, MD 04/18/16 [id number]

## 2016-04-18 NOTE — Discharge Instructions (Signed)
Upper Respiratory Infection, Adult Most upper respiratory infections (URIs) are a viral infection of the air passages leading to the lungs. A URI affects the nose, throat, and upper air passages. The most common type of URI is nasopharyngitis and is typically referred to as "the common cold." URIs run their course and usually go away on their own. Most of the time, a URI does not require medical attention, but sometimes a bacterial infection in the upper airways can follow a viral infection. This is called a secondary infection. Sinus and middle ear infections are common types of secondary upper respiratory infections. Bacterial pneumonia can also complicate a URI. A URI can worsen asthma and chronic obstructive pulmonary disease (COPD). Sometimes, these complications can require emergency medical care and may be life threatening.  CAUSES Almost all URIs are caused by viruses. A virus is a type of germ and can spread from one person to another.  RISKS FACTORS You may be at risk for a URI if:   You smoke.   You have chronic heart or lung disease.  You have a weakened defense (immune) system.   You are very young or very old.   You have nasal allergies or asthma.  You work in crowded or poorly ventilated areas.  You work in health care facilities or schools. SIGNS AND SYMPTOMS  Symptoms typically develop 2-3 days after you come in contact with a cold virus. Most viral URIs last 7-10 days. However, viral URIs from the influenza virus (flu virus) can last 14-18 days and are typically more severe. Symptoms may include:   Runny or stuffy (congested) nose.   Sneezing.   Cough.   Sore throat.   Headache.   Fatigue.   Fever.   Loss of appetite.   Pain in your forehead, behind your eyes, and over your cheekbones (sinus pain).  Muscle aches.  DIAGNOSIS  Your health care provider may diagnose a URI by:  Physical exam.  Tests to check that your symptoms are not due to  another condition such as:  Strep throat.  Sinusitis.  Pneumonia.  Asthma. TREATMENT  A URI goes away on its own with time. It cannot be cured with medicines, but medicines may be prescribed or recommended to relieve symptoms. Medicines may help:  Reduce your fever.  Reduce your cough.  Relieve nasal congestion. HOME CARE INSTRUCTIONS   Take medicines only as directed by your health care provider.   Gargle warm saltwater or take cough drops to comfort your throat as directed by your health care provider.  Use a warm mist humidifier or inhale steam from a shower to increase air moisture. This may make it easier to breathe.  Drink enough fluid to keep your urine clear or pale yellow.   Eat soups and other clear broths and maintain good nutrition.   Rest as needed.   Return to work when your temperature has returned to normal or as your health care provider advises. You may need to stay home longer to avoid infecting others. You can also use a face mask and careful hand washing to prevent spread of the virus.  Increase the usage of your inhaler if you have asthma.   Do not use any tobacco products, including cigarettes, chewing tobacco, or electronic cigarettes. If you need help quitting, ask your health care provider. PREVENTION  The best way to protect yourself from getting a cold is to practice good hygiene.   Avoid oral or hand contact with people with cold   symptoms.   Wash your hands often if contact occurs.  There is no clear evidence that vitamin C, vitamin E, echinacea, or exercise reduces the chance of developing a cold. However, it is always recommended to get plenty of rest, exercise, and practice good nutrition.  SEEK MEDICAL CARE IF:   You are getting worse rather than better.   Your symptoms are not controlled by medicine.   You have chills.  You have worsening shortness of breath.  You have brown or red mucus.  You have yellow or brown nasal  discharge.  You have pain in your face, especially when you bend forward.  You have a fever.  You have swollen neck glands.  You have pain while swallowing.  You have white areas in the back of your throat. SEEK IMMEDIATE MEDICAL CARE IF:   You have severe or persistent:  Headache.  Ear pain.  Sinus pain.  Chest pain.  You have chronic lung disease and any of the following:  Wheezing.  Prolonged cough.  Coughing up blood.  A change in your usual mucus.  You have a stiff neck.  You have changes in your:  Vision.  Hearing.  Thinking.  Mood. MAKE SURE YOU:   Understand these instructions.  Will watch your condition.  Will get help right away if you are not doing well or get worse.   This information is not intended to replace advice given to you by your health care provider. Make sure you discuss any questions you have with your health care provider.   Document Released: 06/05/2001 Document Revised: 04/26/2015 Document Reviewed: 03/17/2014 Elsevier Interactive Patient Education 2016 [employer]vier Inc.  

## 2016-04-18 NOTE — ED Notes (Signed)
Pt c/o productive cough, fevers at times.

## 2016-05-01 ENCOUNTER — Telehealth: Payer: Self-pay | Admitting: Internal Medicine

## 2016-05-01 ENCOUNTER — Telehealth: Payer: Self-pay | Admitting: Cardiology

## 2016-05-01 NOTE — Telephone Encounter (Signed)
Transmission reviewed. Normal device function noted. Presenting rhythm is AsVp with frequent PVCs. <1%AF burden + Xarelto---last episode 5/6. 1,530 "PMT" episodes recorded from various days and times.  I explained to wife that patient was getting a pulse reading of 39bpm on his BP cuff due to the extra beats. I told her that she should take a radial pulse a for more accurate reading. Wife took his pulse and it was 83bpm.  Patient denied CP or ShOB. He states that his only problem is fatigue.  Will inform Dr.Taylor about PVCs and pt sx's and call patient if anything further is recommended.   Patient voiced understanding.

## 2016-05-01 NOTE — Telephone Encounter (Signed)
New Message  Pt wife stated that during call w/ Shakila- pt forgot to mention that he takes Xarelto 20 mg daily.

## 2016-05-01 NOTE — Telephone Encounter (Signed)
Pt called and he stated that he has been feeling drained. He stated that his blood pressure monitor read his pulse at 39. Instructed pt to send a manual transmission and informed him that once we receive it someone will call him to go over it. Pt verbalized understanding.

## 2016-05-01 NOTE — Telephone Encounter (Signed)
Spoke to Dr.Taylor about patient's frequent PVCs.  Dr.Taylor recommends follow up in ~2weeks for further evaluation. Dr.Taylor also said that patient could cancel if he felt better.  I spoke to patient about Dr.Taylor's recommendations. Patient voiced understanding.  Will defer scheduling of the appt to [facility].

## 2016-05-10 DIAGNOSIS — Z0000 Encounter for general adult medical examination without abnormal findings: Secondary | ICD-10-CM | POA: Diagnosis not present

## 2016-05-10 DIAGNOSIS — R351 Nocturia: Secondary | ICD-10-CM | POA: Diagnosis not present

## 2016-05-10 DIAGNOSIS — R972 Elevated prostate specific antigen [PSA]: Secondary | ICD-10-CM | POA: Diagnosis not present

## 2016-05-10 DIAGNOSIS — N401 Enlarged prostate with lower urinary tract symptoms: Secondary | ICD-10-CM | POA: Diagnosis not present

## 2016-05-10 DIAGNOSIS — N138 Other obstructive and reflux uropathy: Secondary | ICD-10-CM | POA: Diagnosis not present

## 2016-05-24 ENCOUNTER — Telehealth: Payer: Self-pay | Admitting: Internal Medicine

## 2016-05-24 NOTE — Telephone Encounter (Signed)
LMOVM for pt to return call 

## 2016-05-24 NOTE — Telephone Encounter (Signed)
New Message  Pt stated that his remote check is sched for today- 6/1- his power went out this morning and wanted to know if he needs to re send the signal. Pt wants to know how to "re-program" the monitor. Please call back and discuss.

## 2016-05-25 NOTE — Telephone Encounter (Signed)
Spoke w/ pt and informed him how to send a manual transmission b/c his home monitor has not updated since 05-19-16. Pt verbalized understanding.

## 2016-05-29 ENCOUNTER — Ambulatory Visit (INDEPENDENT_AMBULATORY_CARE_PROVIDER_SITE_OTHER): Payer: Medicare Other | Admitting: Internal Medicine

## 2016-05-29 ENCOUNTER — Encounter: Payer: Self-pay | Admitting: Internal Medicine

## 2016-05-29 ENCOUNTER — Other Ambulatory Visit: Payer: Self-pay

## 2016-05-29 VITALS — BP 164/76 | HR 84 | Ht 71.0 in | Wt 152.0 lb

## 2016-05-29 DIAGNOSIS — I495 Sick sinus syndrome: Secondary | ICD-10-CM

## 2016-05-29 LAB — CUP PACEART INCLINIC DEVICE CHECK
Battery Remaining Longevity: 110.4
Battery Voltage: 2.99 V
Brady Statistic RA Percent Paced: 30 %
Brady Statistic RV Percent Paced: 95 %
Date Time Interrogation Session: 20170606134346
Implantable Lead Implant Date: 20150430
Implantable Lead Implant Date: 20150430
Implantable Lead Location: 753859
Implantable Lead Location: 753860
Lead Channel Impedance Value: 387.5 Ohm
Lead Channel Impedance Value: 475 Ohm
Lead Channel Pacing Threshold Amplitude: 0.5 V
Lead Channel Pacing Threshold Amplitude: 1.125 V
Lead Channel Pacing Threshold Pulse Width: 0.4 ms
Lead Channel Pacing Threshold Pulse Width: 0.4 ms
Lead Channel Sensing Intrinsic Amplitude: 12 mV
Lead Channel Sensing Intrinsic Amplitude: 3.7 mV
Lead Channel Setting Pacing Amplitude: 1.375
Lead Channel Setting Pacing Amplitude: 2 V
Lead Channel Setting Pacing Pulse Width: 0.4 ms
Lead Channel Setting Sensing Sensitivity: 2 mV
Pulse Gen Model: 2240
Pulse Gen Serial Number: 7618528

## 2016-05-29 NOTE — Patient Instructions (Signed)
Medication Instructions:  Your physician recommends that you continue on your current medications as directed. Please refer to the Current Medication list given to you today.   Labwork: None ordered   Testing/Procedures: None ordered   Follow-Up: Your physician wants you to follow-up in: 12 months with Dr Knox Saliva will receive a reminder letter in the mail two months in advance. If you don't receive a letter, please call our office to schedule the follow-up appointment.  Remote monitoring is used to monitor your Pacemaker  from home. This monitoring reduces the number of office visits required to check your device to one time per year. It allows [place] to keep an eye on the functioning of your device to ensure it is working properly. You are scheduled for a device check from home on 08/29/16. You may send your transmission at any time that day. If you have a wireless device, the transmission will be sent automatically. After your physician reviews your transmission, you will receive a postcard with your next transmission date.    Any Other Special Instructions Will Be Listed Below (If Applicable).     If you need a refill on your cardiac medications before your next appointment, please call your pharmacy.

## 2016-05-29 NOTE — Progress Notes (Signed)
HPI Mr. Karl Howell returns today for followup. 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 He also is a history of sinus node dysfunction. The patient has coronary disease and is asymptomatic. Since his pacemaker was placed, he denies chest pain, shortness of breath, syncope, or dizziness. He is exercising regularly. He has rare palpitations.  No Known Allergies   Current Outpatient Prescriptions  Medication Sig Dispense Refill  . amLODipine (NORVASC) 2.5 MG tablet TAKE 1 TABLET (2.5 MG TOTAL) BY MOUTH DAILY. 90 tablet 3  . atorvastatin (LIPITOR) 40 MG tablet Take 20 mg by mouth daily.     [name] dutasteride (AVODART) 0.5 MG capsule Take 1 capsule by mouth daily.  3  . hydrochlorothiazide (HYDRODIURIL) 25 MG tablet Take 1 tablet (25 mg total) by mouth daily. 90 tablet 2  . losartan (COZAAR) 100 MG tablet TAKE 1 TABLET (100 MG TOTAL) BY MOUTH DAILY. 90 tablet 3  . Magnesium 250 MG TABS Take 1 tablet by mouth daily. Reported on 05/01/2016    . nebivolol (BYSTOLIC) 2.5 MG tablet Take 1 tablet (2.5 mg total) by mouth daily. 90 tablet 3  . NON FORMULARY Take 0.5 tablets by mouth 3 (three) times a week. turmerictcurcumin    . potassium chloride SA (KLOR-CON M20) 20 MEQ tablet Take 2 tablets (40 mEq total) by mouth daily. 60 tablet 0  . QUERCETIN PO Take 1 capsule by mouth 3 (three) times a week.     [name] Resveratrol 250 MG CAPS Take 1 capsule by mouth daily.    . tamsulosin (FLOMAX) 0.4 MG CAPS capsule Take 1 capsule by mouth daily.  3  . UBIQUINOL PO Take 1 capsule by mouth 3 (three) times a week.     Alveda Reasons 20 MG TABS tablet TAKE 1 TABLET BY MOUTH DAILY WITH SUPPER 90 tablet 0   No current facility-administered medications for this visit.     Past Medical History  Diagnosis Date  . HTN (hypertension)   . Arthritis   . Hyperlipidemia   . Symptomatic bradycardia    status post permanent transvenous pacemaker insertion by [name]  . Coronary artery disease     ATHERECTOMY DONE IN [place].  . Paroxysmal atrial fibrillation (HCC)     ROS:   All systems reviewed and negative except as noted in the HPI.   Past Surgical History  Procedure Laterality Date  . Tonsillectomy    . Total knee arthroplasty  11/28/2011    Procedure: TOTAL KNEE ARTHROPLASTY;  Surgeon: [name], MD;  Location: [facility];  Service: Orthopedics;  Laterality: Left;  . Pacemaker insertion  04-22-2014    STJ dual chamber pacemaker implanted for symptomatic sinus pauses  . Transthoracic echocardiogram  03/11/13    EF 55% TO 60%. GRADE 1 DIASTOLIC DYSFUNCTION. VENTRICULAR SEPTUM: THICKNESS IS MILDLY INCREASED. SEPTAL MOTION SHOWED ABNORMAL FUNCTION AND DYSSYNERGY. MV: MILD REGURG.   . Stress myocardial perfusion study  09/18/10    MILD TO MODERATE PERFUSION DEFECT IN THE BASAL INFERIOR AND MID INFERIOR REGION. THIS IS CONSISTANT WITH INFARCT/SCAR. NO ISCHEMIA.  [name] Renal artery duplex  03/11/13    NORMAL.  . Multiple tooth extractions    . Cardiac catheterization      1996    "cleaned out artery out"....no problems since  . Coronary angioplasty      [place]  ??? [place]     Family History  Problem Relation Age of Onset  . Heart attack Mother 45  . Stroke Father 71  . Stroke Sister 71  . Heart attack Brother   . Stroke Sister   . Stroke Sister   . Heart attack Brother      Social History   Social History  . Marital Status: Married    Spouse Name: N/A  . Number of Children: N/A  . Years of Education: N/A   Occupational History  . Not on file.   Social History Main Topics  . Smoking status: Never Smoker   . Smokeless tobacco: Not on file  . Alcohol Use: 1.2 oz/week    2 Cans of beer per week     Comment: occasionally  . Drug Use: No  . Sexual Activity: Not on file   Other Topics Concern  . Not on file   Social History Narrative     BP  164/76 mmHg  Pulse 84  Ht '5\' 11"'$  (1.803 m)  Wt 152 lb (68.947 kg)  BMI 21.21 kg/m2  Physical Exam:  Well appearing 82 year old man, NAD HEENT: Unremarkable Neck:  6 cm JVD, no thyromegally Back:  No CVA tenderness Lungs:  Clear with no wheezes, rales, or rhonchi. HEART:  Regular rate rhythm, no murmurs, no rubs, no clicks Abd:  soft, positive bowel sounds, no organomegally, no rebound, no guarding Ext:  2 plus pulses, no edema, no cyanosis, no clubbing Skin:  No rashes no nodules Neuro:  CN II through XII intact, motor grossly intact   DEVICE  Normal device function.  See PaceArt for details.   A/P 1. Sinus node dysfunction - he is asymptomatic s/p PPM insertion. 2. Dyslipidemia - he will continue statin therapy. 3. HTN - his blood pressure is well controlled. Will follow. 4. PM - his St. Jude DDD PM is working normally. Will recheck in several months.  Karl Howell.D.

## 2016-05-30 ENCOUNTER — Encounter: Payer: Self-pay | Admitting: Internal Medicine

## 2016-06-30 ENCOUNTER — Other Ambulatory Visit: Payer: Self-pay | Admitting: Cardiovascular Disease

## 2016-07-06 ENCOUNTER — Other Ambulatory Visit: Payer: Self-pay | Admitting: *Deleted

## 2016-07-06 MED ORDER — RIVAROXABAN 20 MG PO TABS: ORAL_TABLET | ORAL | Status: DC

## 2016-07-09 ENCOUNTER — Other Ambulatory Visit: Payer: Self-pay

## 2016-07-09 MED ORDER — AMLODIPINE BESYLATE 2.5 MG PO TABS: ORAL_TABLET | ORAL | Status: DC

## 2016-07-09 MED ORDER — LOSARTAN POTASSIUM 100 MG PO TABS: 100.0000 mg | ORAL_TABLET | Freq: Every day | ORAL | Status: DC

## 2016-07-09 MED ORDER — NEBIVOLOL HCL 2.5 MG PO TABS: 2.5000 mg | ORAL_TABLET | Freq: Every day | ORAL | Status: DC

## 2016-07-11 ENCOUNTER — Other Ambulatory Visit: Payer: Self-pay

## 2016-07-13 ENCOUNTER — Other Ambulatory Visit: Payer: Self-pay | Admitting: *Deleted

## 2016-07-13 MED ORDER — LOSARTAN POTASSIUM 100 MG PO TABS: 100.0000 mg | ORAL_TABLET | Freq: Every day | ORAL | Status: DC

## 2016-07-13 MED ORDER — RIVAROXABAN 20 MG PO TABS: ORAL_TABLET | ORAL | Status: DC

## 2016-07-24 ENCOUNTER — Emergency Department (HOSPITAL_COMMUNITY)
Admission: EM | Admit: 2016-07-24 | Discharge: 2016-07-24 | Disposition: A | Discharge status: Home / Self Care | Payer: Medicare Other | Attending: Emergency Medicine | Admitting: Emergency Medicine

## 2016-07-24 ENCOUNTER — Encounter (HOSPITAL_COMMUNITY): Payer: Self-pay | Admitting: Emergency Medicine

## 2016-07-24 ENCOUNTER — Ambulatory Visit: Payer: Medicare Other | Admitting: Cardiovascular Disease

## 2016-07-24 DIAGNOSIS — I2510 Atherosclerotic heart disease of native coronary artery without angina pectoris: Secondary | ICD-10-CM | POA: Diagnosis not present

## 2016-07-24 DIAGNOSIS — R319 Hematuria, unspecified: Secondary | ICD-10-CM | POA: Insufficient documentation

## 2016-07-24 DIAGNOSIS — I10 Essential (primary) hypertension: Secondary | ICD-10-CM | POA: Diagnosis not present

## 2016-07-24 DIAGNOSIS — Z79899 Other long term (current) drug therapy: Secondary | ICD-10-CM | POA: Insufficient documentation

## 2016-07-24 LAB — URINALYSIS, ROUTINE W REFLEX MICROSCOPIC
Bilirubin Urine: NEGATIVE
Glucose, UA: NEGATIVE mg/dL
Nitrite: POSITIVE — AB
Protein, ur: 100 mg/dL — AB
Specific Gravity, Urine: 1.015 (ref 1.005–1.030)
pH: 7 (ref 5.0–8.0)

## 2016-07-24 LAB — URINE MICROSCOPIC-ADD ON

## 2016-07-24 MED ORDER — DOXYCYCLINE HYCLATE 100 MG PO TABS: 100.0000 mg | ORAL_TABLET | Freq: Two times a day (BID) | ORAL | Status: DC

## 2016-07-24 NOTE — ED Triage Notes (Signed)
Pt c/o hematuria that started tonight and states he is [place].

## 2016-07-24 NOTE — Discharge Instructions (Signed)
Please follow up with Dr Risa Grill about the blood in your urine. You did have a CT scan of your abdomen which does not show any reason for you to have blood in your urine. Drink plenty of fluids, in case you get bleeding again. You don't want the blood to form clots which could block your ability to urinate. Return to the ED if you get a fever, vomiting, abdominal pain or back pain.

## 2016-07-24 NOTE — ED Provider Notes (Signed)
[facility] DEPT Provider Note   CSN: [id number] Arrival date & time: 07/24/16  0009  First Provider Contact:  First MD Initiated Contact with Patient 07/24/16 0036     By signing my name below, I, Moises Blood, attest that this documentation has been prepared under the direction and in the presence of Rolland Porter, MD. Electronically Signed: Moises Blood, [place]. 07/24/2016 , 12:48 AM .  Patient was seen in [place] .   History   Chief Complaint Chief Complaint  Patient presents with  . Hematuria    HPI Karl Howell is a 82 y.o. male. Patient with h/o enlarged prostate is complaining of gradual onset hematuria noticed earlier today around 11 pm. He's had hematuria in the past, last being 3 months ago and 3 years ago prior to that. He was seen by [facility] at that time. Patient has history of enlarged prostate and has had 8 biopsies, which came back normal. His PSA fluctuates, ranging from 12~13 to 25. He is on xarelto for the past 1~2 years. He had afib and pacemaker placed about 2~3 years ago. He denies any recent injuries or falls. He denies blood in his stool, Nausea, vomiting, abdominal pain, pain on urination, dizziness or lightheadedness.   He recently had PET scan, CT scan, and a lung biopsy done at [facility], which came back normal. He was given doxycyline 1 capsule qd for 7 days.   PCP [facility] Dr Risa Grill  HPI  Past Medical History:  Diagnosis Date  . Arthritis   . Coronary artery disease    ATHERECTOMY DONE IN [place].  [name] HTN (hypertension)   . Hyperlipidemia   . Paroxysmal atrial fibrillation (HCC)   . Symptomatic bradycardia    status post permanent transvenous pacemaker insertion by [name]    Patient Active Problem List   Diagnosis Date Noted  . Atrial fibrillation ([place]) 05/25/2015  . Pacemaker 08/24/2014  . Sinus pause 04/22/2014  . Atrioventricular block, complete ([place]) 04/22/2014  . Bradycardia  04/19/2014  . Dizziness 04/19/2014  . Coronary artery disease 10/29/2013  . Essential hypertension 10/29/2013  . Hyperlipidemia 10/29/2013  . HIP PAIN 05/11/2010  . SPINAL STENOSIS, LUMBAR 07/22/2008  . DEGENERATIVE JOINT DISEASE, KNEE 12/11/2007  . KNEE PAIN 12/11/2007    Past Surgical History:  Procedure Laterality Date  . CARDIAC CATHETERIZATION     1996    "cleaned out artery out"....no problems since  . CORONARY ANGIOPLASTY     1996  ??? in [place], [place]  . MULTIPLE TOOTH EXTRACTIONS    . PACEMAKER INSERTION  04-22-2014   STJ dual chamber pacemaker implanted for symptomatic sinus pauses  . RENAL ARTERY DUPLEX  03/11/13   NORMAL.  [name] STRESS MYOCARDIAL PERFUSION STUDY  09/18/10   MILD TO MODERATE PERFUSION DEFECT IN THE BASAL INFERIOR AND MID INFERIOR REGION. THIS IS CONSISTANT WITH INFARCT/SCAR. NO ISCHEMIA.  . TONSILLECTOMY    . TOTAL KNEE ARTHROPLASTY  11/28/2011   Procedure: TOTAL KNEE ARTHROPLASTY;  Surgeon: [name], MD;  Location: [facility];  Service: Orthopedics;  Laterality: Left;  . TRANSTHORACIC ECHOCARDIOGRAM  03/11/13   EF 55% TO 60%. GRADE 1 DIASTOLIC DYSFUNCTION. VENTRICULAR SEPTUM: THICKNESS IS MILDLY INCREASED. SEPTAL MOTION SHOWED ABNORMAL FUNCTION AND DYSSYNERGY. MV: MILD REGURG.        Home Medications    Prior to Admission medications   Medication Sig Start Date End Date Taking? Authorizing Provider  amLODipine (NORVASC) 2.5 MG tablet TAKE  1 TABLET (2.5 MG TOTAL) BY MOUTH DAILY. 07/09/16   [name], MD  atorvastatin (LIPITOR) 40 MG tablet Take 20 mg by mouth daily.     Historical Provider, MD  dutasteride (AVODART) 0.5 MG capsule Take 1 capsule by mouth daily. 05/10/16   Historical Provider, MD  hydrochlorothiazide (HYDRODIURIL) 25 MG tablet Take 1 tablet (25 mg total) by mouth daily. 10/18/15   [name], MD  losartan (COZAAR) 100 MG tablet Take 1 tablet (100 mg total) by mouth daily. 07/13/16   [name], MD  Magnesium 250 MG TABS  Take 1 tablet by mouth daily. Reported on 05/01/2016    Historical Provider, MD  nebivolol (BYSTOLIC) 2.5 MG tablet Take 1 tablet (2.5 mg total) by mouth daily. 07/09/16   [name], MD  NON FORMULARY Take 0.5 tablets by mouth 3 (three) times a week. turmerictcurcumin    Historical Provider, MD  potassium chloride SA (KLOR-CON M20) 20 MEQ tablet Take 2 tablets (40 mEq total) by mouth daily. 05/09/15   [name], MD  QUERCETIN PO Take 1 capsule by mouth 3 (three) times a week.     Historical Provider, MD  Resveratrol 250 MG CAPS Take 1 capsule by mouth daily.    Historical Provider, MD  rivaroxaban (XARELTO) 20 MG TABS tablet TAKE 1 TABLET BY MOUTH DAILY WITH SUPPER 07/13/16   [name], MD  tamsulosin (FLOMAX) 0.4 MG CAPS capsule Take 1 capsule by mouth daily. 05/10/16   Historical Provider, MD  UBIQUINOL PO Take 1 capsule by mouth 3 (three) times a week.     Historical Provider, MD    Family History Family History  Problem Relation Age of Onset  . Heart attack Mother 74  . Stroke Father 29  . Stroke Sister 47  . Heart attack Brother   . Stroke Sister   . Stroke Sister   . Heart attack Brother     Social History Social History  Substance Use Topics  . Smoking status: Never Smoker  . Smokeless tobacco: Never Used  . Alcohol use 1.2 oz/week    2 Cans of beer per week     Comment: occasionally  Lives with spouse Lives at home   Allergies   Review of patient's allergies indicates no known allergies.   Review of Systems Review of Systems  Gastrointestinal: Negative for abdominal pain and blood in stool.  Genitourinary: Positive for hematuria. Negative for difficulty urinating, dysuria, flank pain and frequency.  Neurological: Negative for dizziness and light-headedness.  All other systems reviewed and are negative.    Physical Exam Updated Vital Signs ED Triage Vitals [07/24/16 0016]  Enc Vitals Group     BP (!) 161/101     Pulse Rate 75     Resp 20      Temp 97.6 F (36.4 C)     Temp src      SpO2 100 %     Weight 152 lb (68.9 kg)     Height '5\' 11"'$  (1.803 m)     Head Circumference      Peak Flow      Pain Score      Pain Loc      Pain Edu?      Excl. in [place]?    Vital signs normal except hypertension.   Physical Exam  Constitutional: He is oriented to person, place, and time. He appears well-developed and well-nourished.  Non-toxic appearance. He does not appear ill.  No distress.  HENT:  Head: Normocephalic and atraumatic.  Right Ear: External ear normal.  Left Ear: External ear normal.  Nose: Nose normal. No mucosal edema or rhinorrhea.  Mouth/Throat: Oropharynx is clear and moist and mucous membranes are normal. No dental abscesses or uvula swelling.  Eyes: Conjunctivae and EOM are normal. Pupils are equal, round, and reactive to light.  Neck: Normal range of motion and full passive range of motion without pain. Neck supple.  Cardiovascular: Normal rate, regular rhythm and normal heart sounds.  Exam reveals no gallop and no friction rub.   No murmur heard. Pulmonary/Chest: Effort normal and breath sounds normal. No respiratory distress. He has no wheezes. He has no rhonchi. He has no rales. He exhibits no tenderness and no crepitus.  Abdominal: Soft. Normal appearance and bowel sounds are normal. He exhibits no distension. There is no tenderness. There is no rebound and no guarding.  Musculoskeletal: Normal range of motion.  Moves all extremities well.   Neurological: He is alert and oriented to person, place, and time. He has normal strength. No cranial nerve deficit.  Skin: Skin is warm, dry and intact. No rash noted. No erythema. No pallor.  Psychiatric: He has a normal mood and affect. His speech is normal and behavior is normal. His mood appears not anxious.  Nursing note and vitals reviewed.    ED Treatments / Results  Labs (all labs ordered are listed, but only abnormal results are displayed) Results for orders  placed or performed during the hospital encounter of 07/24/16  Urinalysis, Routine w reflex microscopic  Result Value Ref Range   Color, Urine RED (A) YELLOW   APPearance CLOUDY (A) CLEAR   Specific Gravity, Urine 1.015 1.005 - 1.030   pH 7.0 5.0 - 8.0   Glucose, UA NEGATIVE NEGATIVE mg/dL   Hgb urine dipstick LARGE (A) NEGATIVE   Bilirubin Urine NEGATIVE NEGATIVE   Ketones, ur TRACE (A) NEGATIVE mg/dL   Protein, ur 100 (A) NEGATIVE mg/dL   Nitrite POSITIVE (A) NEGATIVE   Leukocytes, UA SMALL (A) NEGATIVE  Urine microscopic-add on  Result Value Ref Range   Squamous Epithelial / LPF 0-5 (A) NONE SEEN   WBC, UA 0-5 0 - 5 WBC/hpf   RBC / HPF TOO NUMEROUS TO COUNT 0 - 5 RBC/hpf   Bacteria, UA MANY (A) NONE SEEN   Laboratory interpretation all normal except Hematuria and bacteriuria, urine culture sent        Procedures Procedures (including critical care time)  Medications Ordered in ED Medications - No data to display   Initial Impression / Assessment and Plan / ED Course  I have reviewed the triage vital signs and the nursing notes.  Pertinent labs & imaging results that were available during my care of the patient were reviewed by me and considered in my medical decision making (see chart for details).  Clinical Course   Patient had cherry Kool-Aid urine in his urinal when I first went in the room. It was not clear to me when I talk to him what CT scans he had had done at the [place] recently. We are trying to contact the [facility] to see what was CT scanned. He also stated he had a PET scan.  Recheck at 3 AM patient now has clear urine, the procedure hematuria is gone. I have received his reports from the [facility] and he did have a CT of the abdomen already done. The CT of his abdomen does not  reveal any reason for that hematuria that he had tonight. Patient states he's going to follow-up with his urologist, Dr. Risa Grill.   Records from the [place] shows patient had a CT of  the abdomen/pelvis with contrast done on June 16 without any abnormality which would explain his hematuria. He also had CT of the chest and head with contrast and PET scan  Final Clinical Impressions(s) / ED Diagnoses   Final diagnoses:  Painless hematuria    Plan discharge  Rolland Porter, MD, FACEP    I personally performed the services described in this documentation, which was scribed in my presence. The recorded information has been reviewed and considered.  Rolland Porter, MD, Barbette Or, MD 07/24/16 [phone]

## 2016-07-25 DIAGNOSIS — N401 Enlarged prostate with lower urinary tract symptoms: Secondary | ICD-10-CM | POA: Diagnosis not present

## 2016-07-25 DIAGNOSIS — R351 Nocturia: Secondary | ICD-10-CM | POA: Diagnosis not present

## 2016-07-25 DIAGNOSIS — R310 Gross hematuria: Secondary | ICD-10-CM | POA: Diagnosis not present

## 2016-07-25 LAB — URINE CULTURE: Culture: <10000 — AB

## 2016-08-28 ENCOUNTER — Ambulatory Visit (INDEPENDENT_AMBULATORY_CARE_PROVIDER_SITE_OTHER): Payer: Medicare Other | Admitting: *Deleted

## 2016-08-28 DIAGNOSIS — I495 Sick sinus syndrome: Secondary | ICD-10-CM

## 2016-08-28 NOTE — Progress Notes (Signed)
Remote pacemaker transmission.   

## 2016-08-29 ENCOUNTER — Encounter: Payer: Self-pay | Admitting: Cardiovascular Disease

## 2016-08-29 ENCOUNTER — Ambulatory Visit (INDEPENDENT_AMBULATORY_CARE_PROVIDER_SITE_OTHER): Payer: Medicare Other | Admitting: Cardiovascular Disease

## 2016-08-29 VITALS — BP 160/76 | HR 73 | Ht 71.0 in | Wt 151.6 lb

## 2016-08-29 DIAGNOSIS — I10 Essential (primary) hypertension: Secondary | ICD-10-CM | POA: Diagnosis not present

## 2016-08-29 DIAGNOSIS — I2510 Atherosclerotic heart disease of native coronary artery without angina pectoris: Secondary | ICD-10-CM | POA: Diagnosis not present

## 2016-08-29 DIAGNOSIS — E785 Hyperlipidemia, unspecified: Secondary | ICD-10-CM | POA: Diagnosis not present

## 2016-08-29 DIAGNOSIS — I442 Atrioventricular block, complete: Secondary | ICD-10-CM

## 2016-08-29 DIAGNOSIS — I2583 Coronary atherosclerosis due to lipid rich plaque: Secondary | ICD-10-CM

## 2016-08-29 DIAGNOSIS — Z79899 Other long term (current) drug therapy: Secondary | ICD-10-CM | POA: Diagnosis not present

## 2016-08-29 LAB — HEPATIC FUNCTION PANEL
ALT: 17 U/L (ref 9–46)
AST: 28 U/L (ref 10–35)
Albumin: 4.5 g/dL (ref 3.6–5.1)
Alkaline Phosphatase: 45 U/L (ref 40–115)
Bilirubin, Direct: 0.2 mg/dL (ref <=0.2)
Indirect Bilirubin: 0.5 mg/dL (ref 0.2–1.2)
Total Bilirubin: 0.7 mg/dL (ref 0.2–1.2)
Total Protein: 7.3 g/dL (ref 6.1–8.1)

## 2016-08-29 LAB — LIPID PANEL
Cholesterol: 174 mg/dL (ref 125–200)
HDL: 76 mg/dL (ref >=40)
LDL Cholesterol: 86 mg/dL (ref <130)
Total CHOL/HDL Ratio: 2.3 Ratio (ref <=5.0)
Triglycerides: 61 mg/dL (ref <150)
VLDL: 12 mg/dL (ref <30)

## 2016-08-29 NOTE — Assessment & Plan Note (Signed)
History of hyperlipidemia on statin therapy. We will recheck a lipid and liver profile 

## 2016-08-29 NOTE — Assessment & Plan Note (Signed)
History of coronary artery disease status post coronary atherectomy in [place] in 1999. He has been stable since. He denies chest pain or shortness of breath.

## 2016-08-29 NOTE — Assessment & Plan Note (Signed)
History of AV block status post permanent transvenous pacemaker insertion by [name]." He was noted to have asymptomatic atrial fibrillation resulting in the addition of Xarelto  oral anticoagulation.

## 2016-08-29 NOTE — Assessment & Plan Note (Signed)
History of hypertension but blood pressure measured at 160/76. He is on amlodipine, hydrochlorothiazide, losartan and Bystolic . Continue current meds at current dosing.

## 2016-08-29 NOTE — Patient Instructions (Signed)
Medication Instructions:   NO CHANGES.  Labwork: Your physician recommends that you return for lab work TODAY- YOU WILL NEED TO BE FASTING. The lab can be found on the FIRST FLOOR of out building in [place]: Your physician wants you to follow-up in: [facility]. You will receive a reminder letter in the mail two months in advance. If you don't receive a letter, please call our office to schedule the follow-up appointment.  If you need a refill on your cardiac medications before your next appointment, please call your pharmacy.

## 2016-08-29 NOTE — Progress Notes (Signed)
08/29/2016 Karl Howell   10-07-1934  [id number]  Primary Physician [facility] Primary Cardiologist: Karl Harp MD Renae Gloss  HPI:  Karl Howell is a 82 year old thin appearing married African American male father of 79, grandfather to 6 grandchildren who is a retired Administrator in the [employer]. He was a patient of Dr. Terance Ice. I last saw him in the office 05/27/15. His cardiac risk factor profile is a remarkable for treated hypertension and hyperlipidemia. His mother did die of myocardial infarction at age 93. He does not smoke. Drinks socially. He did have a coronary atherectomy in [place] in 1999 and has been stable since. He is totally asymptomatic denying chest pain or shortness of breath. He had complete heart block with symptomatic pauses and underwent DDD permanent transvenous pacemaker insertion by [name]. Interrogation of the pacemaker has shown prolonged episodes of asymptomatic atrial fibrillation and because of this he was changed from low-dose aspirin to Xarelto oral coagulation.[name] He feels much better after pacer insertion. Since I saw him a year ago he remained completely asymptomatic. He has however had some unexplained weight loss which has been adequately worked up.   Current Outpatient Prescriptions  Medication Sig Dispense Refill  . amLODipine (NORVASC) 2.5 MG tablet TAKE 1 TABLET (2.5 MG TOTAL) BY MOUTH DAILY. 90 tablet 3  . atorvastatin (LIPITOR) 40 MG tablet Take 20 mg by mouth daily.     [name] CAVERJECT 40 MCG SOLR Take one injection into the skin daily as needed for erection    . dutasteride (AVODART) 0.5 MG capsule Take 1 capsule by mouth daily.  3  . hydrochlorothiazide (HYDRODIURIL) 25 MG tablet Take 1 tablet (25 mg total) by mouth daily. 90 tablet 2  . losartan (COZAAR) 100 MG tablet Take 1 tablet (100 mg total) by mouth daily. 90 tablet 3  . nebivolol (BYSTOLIC) 2.5 MG tablet Take 1 tablet (2.5 mg total) by mouth  daily. 90 tablet 3  . NON FORMULARY Take 0.5 tablets by mouth 3 (three) times a week. turmerictcurcumin    . potassium chloride SA (KLOR-CON M20) 20 MEQ tablet Take 2 tablets (40 mEq total) by mouth daily. 60 tablet 0  . QUERCETIN PO Take 1 capsule by mouth 3 (three) times a week.     [name] Resveratrol 250 MG CAPS Take 1 capsule by mouth daily.    . rivaroxaban (XARELTO) 20 MG TABS tablet TAKE 1 TABLET BY MOUTH DAILY WITH SUPPER 90 tablet 3  . UBIQUINOL PO Take 1 capsule by mouth 3 (three) times a week.      No current facility-administered medications for this visit.     No Known Allergies  Social History   Social History  . Marital status: Married    Spouse name: N/A  . Number of children: N/A  . Years of education: N/A   Occupational History  . Not on file.   Social History Main Topics  . Smoking status: Never Smoker  . Smokeless tobacco: Never Used  . Alcohol use 1.2 oz/week    2 Cans of beer per week     Comment: occasionally  . Drug use: No  . Sexual activity: Not on file   Other Topics Concern  . Not on file   Social History Narrative  . No narrative on file     Review of Systems: General: negative for chills, fever, night sweats or weight changes.  Cardiovascular: negative for chest pain,  dyspnea on exertion, edema, orthopnea, palpitations, paroxysmal nocturnal dyspnea or shortness of breath Dermatological: negative for rash Respiratory: negative for cough or wheezing Urologic: negative for hematuria Abdominal: negative for nausea, vomiting, diarrhea, bright red blood per rectum, melena, or hematemesis Neurologic: negative for visual changes, syncope, or dizziness All other systems reviewed and are otherwise negative except as noted above.    Blood pressure (!) 160/76, pulse 73, height '5\' 11"'$  (1.803 m), weight 151 lb 9.6 oz (68.8 kg).  General appearance: alert and no distress Neck: no adenopathy, no carotid bruit, no JVD, supple, symmetrical, trachea midline  and thyroid not enlarged, symmetric, no tenderness/mass/nodules Lungs: clear to auscultation bilaterally Heart: regular rate and rhythm, S1, S2 normal, no murmur, click, rub or gallop Extremities: extremities normal, atraumatic, no cyanosis or edema  EKG not performed today  ASSESSMENT AND PLAN:   Coronary artery disease History of coronary artery disease status post coronary atherectomy in [place] in 1999. He has been stable since. He denies chest pain or shortness of breath.  Essential hypertension History of hypertension but blood pressure measured at 160/76. He is on amlodipine, hydrochlorothiazide, losartan and Bystolic . Continue current meds at current dosing.  Hyperlipidemia History of hyperlipidemia on statin therapy. We will recheck a lipid and liver profile  Atrioventricular block, complete History of AV block status post permanent transvenous pacemaker insertion by [name]." He was noted to have asymptomatic atrial fibrillation resulting in the addition of Xarelto  oral anticoagulation.      Karl Harp MD FACP,FACC,FAHA, [facility] 08/29/2016 9:49 AM

## 2016-08-31 ENCOUNTER — Encounter: Payer: Self-pay | Admitting: Cardiology

## 2016-09-05 DIAGNOSIS — E559 Vitamin D deficiency, unspecified: Secondary | ICD-10-CM | POA: Diagnosis not present

## 2016-09-05 DIAGNOSIS — R911 Solitary pulmonary nodule: Secondary | ICD-10-CM | POA: Diagnosis not present

## 2016-09-05 DIAGNOSIS — I10 Essential (primary) hypertension: Secondary | ICD-10-CM | POA: Diagnosis not present

## 2016-09-05 DIAGNOSIS — Z79899 Other long term (current) drug therapy: Secondary | ICD-10-CM | POA: Diagnosis not present

## 2016-09-05 LAB — CUP PACEART REMOTE DEVICE CHECK
Battery Remaining Longevity: 112 mo
Battery Remaining Percentage: 95.5 %
Battery Voltage: 2.99 V
Brady Statistic AP VP Percent: 34 %
Brady Statistic AP VS Percent: 1 %
Brady Statistic AS VP Percent: 61 %
Brady Statistic AS VS Percent: 1.7 %
Brady Statistic RA Percent Paced: 28 %
Brady Statistic RV Percent Paced: 94 %
Date Time Interrogation Session: 20170905060013
Implantable Lead Implant Date: 20150430
Implantable Lead Implant Date: 20150430
Implantable Lead Location: 753859
Implantable Lead Location: 753860
Lead Channel Impedance Value: 360 Ohm
Lead Channel Impedance Value: 450 Ohm
Lead Channel Pacing Threshold Amplitude: 0.5 V
Lead Channel Pacing Threshold Amplitude: 1.125 V
Lead Channel Pacing Threshold Pulse Width: 0.4 ms
Lead Channel Pacing Threshold Pulse Width: 0.4 ms
Lead Channel Sensing Intrinsic Amplitude: 12 mV
Lead Channel Sensing Intrinsic Amplitude: 3.8 mV
Lead Channel Setting Pacing Amplitude: 1.375
Lead Channel Setting Pacing Amplitude: 2 V
Lead Channel Setting Pacing Pulse Width: 0.4 ms
Lead Channel Setting Sensing Sensitivity: 2 mV
Pulse Gen Model: 2240
Pulse Gen Serial Number: 7618528

## 2016-09-14 ENCOUNTER — Encounter: Payer: Self-pay | Admitting: Cardiology

## 2016-09-21 ENCOUNTER — Encounter: Payer: Self-pay | Admitting: *Deleted

## 2016-11-27 ENCOUNTER — Ambulatory Visit (INDEPENDENT_AMBULATORY_CARE_PROVIDER_SITE_OTHER): Payer: Medicare Other | Admitting: *Deleted

## 2016-11-27 DIAGNOSIS — I495 Sick sinus syndrome: Secondary | ICD-10-CM | POA: Diagnosis not present

## 2016-11-27 NOTE — Progress Notes (Signed)
Remote pacemaker transmission.   

## 2016-12-05 ENCOUNTER — Encounter: Payer: Self-pay | Admitting: Cardiology

## 2016-12-13 ENCOUNTER — Telehealth: Payer: Self-pay | Admitting: Cardiovascular Disease

## 2016-12-13 NOTE — Telephone Encounter (Signed)
Spoke with [place], [place] at [facility].  Patient is having lung resection surgery today and the surgeons need device info for perioperative management.  Advised Scott that per our notes, patient is intermittently pacer dependent.  [place] phone number [phone]) so that they can page rep on call in the [facility] area for further assistance.  Advised that perioperative PPM reprogramming for asynchronous pacing is most appropriate due to proximity of surgery to pacemaker.  Scott, PA verbalizes understanding and denies additional questions or concerns at this time.

## 2016-12-13 NOTE — Telephone Encounter (Signed)
New message    Nicki Reaper from the [place] needs interactive management for his device from a rn

## 2016-12-19 ENCOUNTER — Encounter: Payer: Self-pay | Admitting: Cardiology

## 2016-12-21 LAB — CUP PACEART REMOTE DEVICE CHECK
Brady Statistic RA Percent Paced: 29 %
Brady Statistic RV Percent Paced: 95 %
Date Time Interrogation Session: 20171229124456
Implantable Lead Implant Date: 20150430
Implantable Lead Implant Date: 20150430
Implantable Lead Location: 753859
Implantable Lead Location: 753860
Implantable Pulse Generator Implant Date: 20150430
Lead Channel Impedance Value: 360 Ohm
Lead Channel Impedance Value: 450 Ohm
Lead Channel Pacing Threshold Amplitude: 1.125 V
Lead Channel Pacing Threshold Pulse Width: 0.4 ms
Lead Channel Sensing Intrinsic Amplitude: 12 mV
Lead Channel Sensing Intrinsic Amplitude: 4.1 mV
Pulse Gen Model: 2240
Pulse Gen Serial Number: 7618528

## 2017-01-01 ENCOUNTER — Telehealth: Payer: Self-pay | Admitting: Internal Medicine

## 2017-01-01 NOTE — Telephone Encounter (Signed)
Spoke to [employer] with the [facility] about patient's upcoming surgery. She said that he will have a lung resection tomorrow. She said that industry has already been set up for the procedure, but she asked that patient's last device report be faxed to her.   Report faxed to fax number provided.

## 2017-01-01 NOTE — Telephone Encounter (Signed)
New Message  Calling to confirm to see what type of pacemaker the pt has.  Pt is having surgery tomorrow and would like for nurse to follow up with Drue Dun.  Please f/u

## 2017-01-01 NOTE — Telephone Encounter (Signed)
LMTCB/sss (for [place])

## 2017-01-01 NOTE — Telephone Encounter (Signed)
Kristie with [facility] called back. Please call her back.

## 2017-02-26 ENCOUNTER — Ambulatory Visit (INDEPENDENT_AMBULATORY_CARE_PROVIDER_SITE_OTHER): Payer: Medicare Other | Admitting: *Deleted

## 2017-02-26 DIAGNOSIS — I495 Sick sinus syndrome: Secondary | ICD-10-CM

## 2017-02-26 NOTE — Progress Notes (Signed)
Remote pacemaker transmission.   

## 2017-02-27 ENCOUNTER — Encounter: Payer: Self-pay | Admitting: Cardiology

## 2017-02-27 LAB — CUP PACEART REMOTE DEVICE CHECK
Battery Remaining Longevity: 113 mo
Battery Remaining Percentage: 95.5 %
Battery Voltage: 2.99 V
Brady Statistic AP VP Percent: 18 %
Brady Statistic AP VS Percent: 1 %
Brady Statistic AS VP Percent: 63 %
Brady Statistic AS VS Percent: 11 %
Brady Statistic RA Percent Paced: 1 %
Brady Statistic RV Percent Paced: 87 %
Date Time Interrogation Session: 20180305070014
Implantable Lead Implant Date: 20150430
Implantable Lead Implant Date: 20150430
Implantable Lead Location: 753859
Implantable Lead Location: 753860
Implantable Pulse Generator Implant Date: 20150430
Lead Channel Impedance Value: 360 Ohm
Lead Channel Impedance Value: 460 Ohm
Lead Channel Pacing Threshold Amplitude: 0.5 V
Lead Channel Pacing Threshold Amplitude: 1 V
Lead Channel Pacing Threshold Pulse Width: 0.4 ms
Lead Channel Pacing Threshold Pulse Width: 0.4 ms
Lead Channel Sensing Intrinsic Amplitude: 12 mV
Lead Channel Sensing Intrinsic Amplitude: 4.7 mV
Lead Channel Setting Pacing Amplitude: 2 V
Lead Channel Setting Pacing Amplitude: 2 V
Lead Channel Setting Pacing Pulse Width: 0.4 ms
Lead Channel Setting Sensing Sensitivity: 2 mV
Pulse Gen Model: 2240
Pulse Gen Serial Number: 7618528

## 2017-03-13 ENCOUNTER — Encounter: Payer: Self-pay | Admitting: Cardiology

## 2017-03-31 NOTE — Progress Notes (Signed)
Cardiology Office Note Date:  04/02/2017  Patient ID:  Karl, Howell 1934/01/13, MRN [id number] PCP:  [facility]  Cardiologist:  [name] Electrophysiologist: [name]   Chief Complaint: routine EP/PPM visit  History of Present Illness: Karl Howell is a 83 y.o. male with history of HTN, HLD, CAD remote PCI, sinus node dysfunction and CHB w/PPM, paroxysmal AFib on a/c comes into the office today to be seen for [name].  He was last seen by him in June 2017 doing well,  more recently saw [name], at that time doing well without changes.  Our device clinic RN noted at his last remote he seems to have had an increase in his AF burden in the last 3 months.  The patient tells me he is doing "absolutely great!".  He denies any kind of exertional intolerances, says he can do anything he wants/needs to physically/  He denies any kind of CP or SOB, no palpitations, no dizziness, near syncope or syncope.  He denies any bleeding or signs of bleeding.  About 5 weeks ago had a mass removed "from beside my lung", was found to be cancer and recommended chemo but has declined.    DEVICE information: SJM dual chamber PPM, implanted 04/22/14, [name]   Past Medical History:  Diagnosis Date  . Arthritis   . Coronary artery disease    ATHERECTOMY DONE IN [place].  [name] HTN (hypertension)   . Hyperlipidemia   . Paroxysmal atrial fibrillation (HCC)   . Symptomatic bradycardia    status post permanent transvenous pacemaker insertion by [name]    Past Surgical History:  Procedure Laterality Date  . CARDIAC CATHETERIZATION     1996    "cleaned out artery out"....no problems since  . CORONARY ANGIOPLASTY     1996  ??? in [place], [place]  . MULTIPLE TOOTH EXTRACTIONS    . PACEMAKER INSERTION  04-22-2014   STJ dual chamber pacemaker implanted for symptomatic sinus pauses  . RENAL ARTERY DUPLEX  03/11/13   NORMAL.  [name] STRESS MYOCARDIAL PERFUSION STUDY  09/18/10   MILD TO  MODERATE PERFUSION DEFECT IN THE BASAL INFERIOR AND MID INFERIOR REGION. THIS IS CONSISTANT WITH INFARCT/SCAR. NO ISCHEMIA.  . TONSILLECTOMY    . TOTAL KNEE ARTHROPLASTY  11/28/2011   Procedure: TOTAL KNEE ARTHROPLASTY;  Surgeon: [name], MD;  Location: [facility];  Service: Orthopedics;  Laterality: Left;  . TRANSTHORACIC ECHOCARDIOGRAM  03/11/13   EF 55% TO 60%. GRADE 1 DIASTOLIC DYSFUNCTION. VENTRICULAR SEPTUM: THICKNESS IS MILDLY INCREASED. SEPTAL MOTION SHOWED ABNORMAL FUNCTION AND DYSSYNERGY. MV: MILD REGURG.     Current Outpatient Prescriptions  Medication Sig Dispense Refill  . amLODipine (NORVASC) 2.5 MG tablet TAKE 1 TABLET (2.5 MG TOTAL) BY MOUTH DAILY. 90 tablet 3  . atorvastatin (LIPITOR) 40 MG tablet Take 20 mg by mouth daily.     [name] CAVERJECT 40 MCG SOLR Take one injection into the skin daily as needed for erection    . dutasteride (AVODART) 0.5 MG capsule Take 1 capsule by mouth daily.  3  . hydrochlorothiazide (HYDRODIURIL) 25 MG tablet Take 1 tablet (25 mg total) by mouth daily. 90 tablet 2  . losartan (COZAAR) 100 MG tablet Take 1 tablet (100 mg total) by mouth daily. 90 tablet 3  . nebivolol (BYSTOLIC) 2.5 MG tablet Take 1 tablet (2.5 mg total) by mouth daily. 90 tablet 3  . NON FORMULARY Take 0.5 tablets by  mouth 3 (three) times a week. turmerictcurcumin    . potassium chloride SA (KLOR-CON M20) 20 MEQ tablet Take 2 tablets (40 mEq total) by mouth daily. 60 tablet 0  . QUERCETIN PO Take 1 capsule by mouth 3 (three) times a week.     [name] Resveratrol 250 MG CAPS Take 1 capsule by mouth daily.    . rivaroxaban (XARELTO) 20 MG TABS tablet TAKE 1 TABLET BY MOUTH DAILY WITH SUPPER 90 tablet 3  . UBIQUINOL PO Take 1 capsule by mouth 3 (three) times a week.      No current facility-administered medications for this visit.     Allergies:   Patient has no known allergies.   Social History:  The patient  reports that he has never smoked. He has never used smokeless tobacco. He  reports that he drinks about 1.2 oz of alcohol per week . He reports that he does not use drugs.   Family History:  The patient's family history includes Heart attack in his brother and brother; Heart attack (age of onset: 93) in his mother; Stroke in his sister and sister; Stroke (age of onset: 83) in his father; Stroke (age of onset: 34) in his sister.  ROS:  Please see the history of present illness.    All other systems are reviewed and otherwise negative.   PHYSICAL EXAM:  VS:  BP (!) 154/76   Pulse 88   Ht '5\' 11"'$  (1.803 m)   Wt 156 lb (70.8 kg)   BMI 21.76 kg/m  BMI: Body mass index is 21.76 kg/m. Well nourished, well developed, in no acute distress  HEENT: normocephalic, atraumatic  Neck: no JVD, carotid bruits or masses Cardiac:  IRRR; no significant murmurs, no rubs, or gallops Lungs:  CTA b/l, no wheezing, rhonchi or rales  Abd: soft, nontender MS: no deformity or atrophy Ext: no edema  Skin: warm and dry, no rash Neuro:  No gross deficits appreciated Psych: euthymic mood, full affect  PPM site is stable, no tethering or discomfort   EKG:  Done today and reviewed by myself shows AFib, Vpaced and intrinsic beats PPM interrogation done today by industry and reviewed by myself: battery and lead testing stable, RV output increased to 2:1 safety margin with threshold up slightly to 1.25V, AMS base pacing rate decreased to 70bpm from 80.  AF burden 97%  03/11/13: TTE Study Conclusions - Left ventricle: The cavity size was normal. Wall thickness was normal. Systolic function was normal. The estimated ejection fraction was in the range of 55% to 60%. Wall motion was normal; there were no regional wall motion abnormalities. Doppler parameters are consistent with abnormal left ventricular relaxation (grade 1 diastolic dysfunction). - Ventricular septum: Thickness was mildly increased. Septal motion showed abnormal function and dyssynergy. - Mitral valve:  Calcified annulus. Mild regurgitation. - Atrial septum: No defect or patent foramen ovale was identified.  Recent Labs: 04/18/2016: B Natriuretic Peptide 343.0; BUN 13; Creatinine, Ser 1.06; Hemoglobin 12.1; Platelets 106; Potassium 3.4; Sodium 134 08/29/2016: ALT 17  08/29/2016: Cholesterol 174; HDL 76; LDL Cholesterol 86; Total CHOL/HDL Ratio 2.3; Triglycerides 61; VLDL 12   CrCl cannot be calculated (Patient's most recent lab result is older than the maximum 21 days allowed.).   Wt Readings from Last 3 Encounters:  04/02/17 156 lb (70.8 kg)  08/29/16 151 lb 9.6 oz (68.8 kg)  07/24/16 152 lb (68.9 kg)     Other studies reviewed: Additional studies/records reviewed today include: summarized above  ASSESSMENT  AND PLAN:  1. PPM    normal device function, no changes made  2. Paroxysmal AFib >> persistent     CHA2DS2Vasc is at least 4, on Xarelto  His af burden is high, he is asymptomatic, but discussed DCCV to try and restore SR.  The patient though declines, says he really does feel well and is not interested.   Will get BMET and CBC given xarelto  3. HTN     stable, no changes     Encouraged minimizing sodium intake  4. CAD     no anginal c/o     On BB, statin, no ASA given xarelto    c/w [name]    Disposition: F/u with q 3 month remote pacer check and [name] in 1 year, sooner if needed.  Current medicines are reviewed at length with the patient today.  The patient did not have any concerns regarding medicines.  Haywood Lasso, PA-C 04/02/2017 11:23 AM     [address] [place] [address] [phone] (office)  [phone] (fax)

## 2017-04-01 ENCOUNTER — Telehealth: Payer: Self-pay | Admitting: *Deleted

## 2017-04-01 NOTE — Telephone Encounter (Signed)
Phone note opened in error.  Increased AT/AF burden since 12/2016, noted via remote transmission.  Patient already scheduled to see Tommye Standard, PA on 04/02/17 for follow-up.

## 2017-04-02 ENCOUNTER — Ambulatory Visit (INDEPENDENT_AMBULATORY_CARE_PROVIDER_SITE_OTHER): Payer: Medicare Other | Admitting: Physician Assistant

## 2017-04-02 VITALS — BP 154/76 | HR 88 | Ht 71.0 in | Wt 156.0 lb

## 2017-04-02 DIAGNOSIS — Z950 Presence of cardiac pacemaker: Secondary | ICD-10-CM | POA: Diagnosis not present

## 2017-04-02 DIAGNOSIS — I2510 Atherosclerotic heart disease of native coronary artery without angina pectoris: Secondary | ICD-10-CM | POA: Diagnosis not present

## 2017-04-02 DIAGNOSIS — I442 Atrioventricular block, complete: Secondary | ICD-10-CM

## 2017-04-02 DIAGNOSIS — I481 Persistent atrial fibrillation: Secondary | ICD-10-CM

## 2017-04-02 DIAGNOSIS — I4891 Unspecified atrial fibrillation: Secondary | ICD-10-CM | POA: Diagnosis not present

## 2017-04-02 DIAGNOSIS — I10 Essential (primary) hypertension: Secondary | ICD-10-CM

## 2017-04-02 DIAGNOSIS — I4819 Other persistent atrial fibrillation: Secondary | ICD-10-CM

## 2017-04-02 LAB — CBC
Hematocrit: 41 % (ref 37.5–51.0)
Hemoglobin: 13 g/dL (ref 13.0–17.7)
MCH: 26.3 pg — ABNORMAL LOW (ref 26.6–33.0)
MCHC: 31.7 g/dL (ref 31.5–35.7)
MCV: 83 fL (ref 79–97)
Platelets: 143 10*3/uL — ABNORMAL LOW (ref 150–379)
RBC: 4.95 x10E6/uL (ref 4.14–5.80)
RDW: 14.1 % (ref 12.3–15.4)
WBC: 4.7 10*3/uL (ref 3.4–10.8)

## 2017-04-02 LAB — BASIC METABOLIC PANEL
BUN/Creatinine Ratio: 12 (ref 10–24)
BUN: 12 mg/dL (ref 8–27)
CO2: 27 mmol/L (ref 18–29)
Calcium: 9.3 mg/dL (ref 8.6–10.2)
Chloride: 98 mmol/L (ref 96–106)
Creatinine, Ser: 0.99 mg/dL (ref 0.76–1.27)
GFR calc Af Amer: 83 mL/min/{1.73_m2} (ref >59)
GFR calc non Af Amer: 72 mL/min/{1.73_m2} (ref >59)
Glucose: 101 mg/dL — ABNORMAL HIGH (ref 65–99)
Potassium: 3.7 mmol/L (ref 3.5–5.2)
Sodium: 144 mmol/L (ref 134–144)

## 2017-04-02 NOTE — Patient Instructions (Addendum)
Medication Instructions:   Your physician recommends that you continue on your current medications as directed. Please refer to the Current Medication list given to you today.   If you need a refill on your cardiac medications before your next appointment, please call your pharmacy.  Labwork:  CBC AND BMET TODAY    Testing/Procedures: NONE ORDERED  TODAY    Follow-Up:  Your physician wants you to follow-up in: [place] will receive a reminder letter in the mail two months in advance. If you don't receive a letter, please call our office to schedule the follow-up appointment.     Remote monitoring is used to monitor your Pacemaker of ICD from home. This monitoring reduces the number of office visits required to check your device to one time per year. It allows [place] to keep an eye on the functioning of your device to ensure it is working properly. You are scheduled for a device check from home on . [phone] You may send your transmission at any time that day. If you have a wireless device, the transmission will be sent automatically. After your physician reviews your transmission, you will receive a postcard with your next transmission date.     Any Other Special Instructions Will Be Listed Below (If Applicable).

## 2017-04-29 ENCOUNTER — Other Ambulatory Visit: Payer: Self-pay | Admitting: Internal Medicine

## 2017-06-04 ENCOUNTER — Other Ambulatory Visit: Payer: Self-pay | Admitting: Cardiovascular Disease

## 2017-06-05 ENCOUNTER — Emergency Department (HOSPITAL_COMMUNITY)
Admission: EM | Admit: 2017-06-05 | Discharge: 2017-06-06 | Disposition: A | Discharge status: Home / Self Care | Payer: Medicare Other | Attending: Emergency Medicine | Admitting: Emergency Medicine

## 2017-06-05 ENCOUNTER — Encounter (HOSPITAL_COMMUNITY): Payer: Self-pay

## 2017-06-05 DIAGNOSIS — Z9861 Coronary angioplasty status: Secondary | ICD-10-CM | POA: Diagnosis not present

## 2017-06-05 DIAGNOSIS — S80262A Insect bite (nonvenomous), left knee, initial encounter: Secondary | ICD-10-CM | POA: Diagnosis not present

## 2017-06-05 DIAGNOSIS — Z79899 Other long term (current) drug therapy: Secondary | ICD-10-CM | POA: Insufficient documentation

## 2017-06-05 DIAGNOSIS — S80261A Insect bite (nonvenomous), right knee, initial encounter: Secondary | ICD-10-CM | POA: Insufficient documentation

## 2017-06-05 DIAGNOSIS — Y9389 Activity, other specified: Secondary | ICD-10-CM | POA: Diagnosis not present

## 2017-06-05 DIAGNOSIS — Y998 Other external cause status: Secondary | ICD-10-CM | POA: Diagnosis not present

## 2017-06-05 DIAGNOSIS — I10 Essential (primary) hypertension: Secondary | ICD-10-CM | POA: Diagnosis not present

## 2017-06-05 DIAGNOSIS — Z950 Presence of cardiac pacemaker: Secondary | ICD-10-CM | POA: Insufficient documentation

## 2017-06-05 DIAGNOSIS — Y929 Unspecified place or not applicable: Secondary | ICD-10-CM | POA: Diagnosis not present

## 2017-06-05 DIAGNOSIS — Z7901 Long term (current) use of anticoagulants: Secondary | ICD-10-CM | POA: Insufficient documentation

## 2017-06-05 DIAGNOSIS — W57XXXA Bitten or stung by nonvenomous insect and other nonvenomous arthropods, initial encounter: Secondary | ICD-10-CM | POA: Insufficient documentation

## 2017-06-05 DIAGNOSIS — I2510 Atherosclerotic heart disease of native coronary artery without angina pectoris: Secondary | ICD-10-CM | POA: Diagnosis not present

## 2017-06-05 DIAGNOSIS — Z96652 Presence of left artificial knee joint: Secondary | ICD-10-CM | POA: Insufficient documentation

## 2017-06-05 NOTE — ED Triage Notes (Signed)
Pt had a tick on the back of his right knee that his wife removed.  Pt has a reddened area to same but has no complaints.

## 2017-06-06 DIAGNOSIS — S80261A Insect bite (nonvenomous), right knee, initial encounter: Secondary | ICD-10-CM | POA: Diagnosis not present

## 2017-06-06 MED ORDER — DOXYCYCLINE HYCLATE 100 MG PO CAPS: 100.0000 mg | ORAL_CAPSULE | Freq: Two times a day (BID) | ORAL | 0 refills | Status: DC

## 2017-06-06 MED ORDER — DOXYCYCLINE HYCLATE 100 MG PO TABS
200.0000 mg | ORAL_TABLET | Freq: Once | ORAL | Status: AC
  Administered 2017-06-06: 200 mg via ORAL
  Filled 2017-06-06: qty 2

## 2017-06-06 MED ORDER — DOXYCYCLINE HYCLATE 100 MG PO TABS: 100.0000 mg | ORAL_TABLET | Freq: Once | ORAL | Status: DC

## 2017-06-06 NOTE — ED Provider Notes (Signed)
[facility] DEPT Provider Note   CSN: [id number] Arrival date & time: 06/05/17  2336     History   Chief Complaint Chief Complaint  Patient presents with  . Tick Removal    HPI Karl Howell is a 83 y.o. male.  Patient's knee-year-old male who presents to the emergency department with complaint of tick bite.  The patient states earlier this morning he noted a tick on the back of his leg. He put a solution on his leg to kill the tick in his wife later remove the tick. His wife became concerned because of information she received that he needed to be seen within 24-48 hours to prevent disease. She insisted that he come to the emergency department for evaluation and to receive medication.       Past Medical History:  Diagnosis Date  . Arthritis   . Coronary artery disease    ATHERECTOMY DONE IN [place].  [name] HTN (hypertension)   . Hyperlipidemia   . Paroxysmal atrial fibrillation (HCC)   . Symptomatic bradycardia    status post permanent transvenous pacemaker insertion by [name]    Patient Active Problem List   Diagnosis Date Noted  . Atrial fibrillation ([place]) 05/25/2015  . Pacemaker 08/24/2014  . Sinus pause 04/22/2014  . Atrioventricular block, complete ([place]) 04/22/2014  . Bradycardia 04/19/2014  . Dizziness 04/19/2014  . Coronary artery disease 10/29/2013  . Essential hypertension 10/29/2013  . Hyperlipidemia 10/29/2013  . HIP PAIN 05/11/2010  . SPINAL STENOSIS, LUMBAR 07/22/2008  . DEGENERATIVE JOINT DISEASE, KNEE 12/11/2007  . KNEE PAIN 12/11/2007    Past Surgical History:  Procedure Laterality Date  . CARDIAC CATHETERIZATION     1996    "cleaned out artery out"....no problems since  . CORONARY ANGIOPLASTY     1996  ??? in [place], [place]  . MULTIPLE TOOTH EXTRACTIONS    . PACEMAKER INSERTION  04-22-2014   STJ dual chamber pacemaker implanted for symptomatic sinus pauses  . RENAL ARTERY DUPLEX  03/11/13   NORMAL.  [name] STRESS MYOCARDIAL  PERFUSION STUDY  09/18/10   MILD TO MODERATE PERFUSION DEFECT IN THE BASAL INFERIOR AND MID INFERIOR REGION. THIS IS CONSISTANT WITH INFARCT/SCAR. NO ISCHEMIA.  . TONSILLECTOMY    . TOTAL KNEE ARTHROPLASTY  11/28/2011   Procedure: TOTAL KNEE ARTHROPLASTY;  Surgeon: [name], MD;  Location: [facility];  Service: Orthopedics;  Laterality: Left;  . TRANSTHORACIC ECHOCARDIOGRAM  03/11/13   EF 55% TO 60%. GRADE 1 DIASTOLIC DYSFUNCTION. VENTRICULAR SEPTUM: THICKNESS IS MILDLY INCREASED. SEPTAL MOTION SHOWED ABNORMAL FUNCTION AND DYSSYNERGY. MV: MILD REGURG.        Home Medications    Prior to Admission medications   Medication Sig Start Date End Date Taking? Authorizing Provider  amLODipine (NORVASC) 2.5 MG tablet TAKE 1 TABLET (2.5 MG TOTAL) BY MOUTH DAILY. 07/09/16   [name], MD  atorvastatin (LIPITOR) 40 MG tablet Take 20 mg by mouth daily.     [provider]  BYSTOLIC 2.5 MG tablet TAKE 1 TABLET DAILY 06/04/17   [name], MD  CAVERJECT 40 MCG SOLR Take one injection into the skin daily as needed for erection 07/20/16   [provider]  doxycycline (VIBRAMYCIN) 100 MG capsule Take 1 capsule (100 mg total) by mouth 2 (two) times daily. 06/06/17   Lily Kocher, PA-C  dutasteride (AVODART) 0.5 MG capsule Take 1 capsule by mouth daily. 05/10/16   [provider]  hydrochlorothiazide (HYDRODIURIL) 25  MG tablet Take 1 tablet (25 mg total) by mouth daily. 10/18/15   [name], MD  losartan (COZAAR) 100 MG tablet Take 1 tablet (100 mg total) by mouth daily. 07/13/16   [name], MD  NON FORMULARY Take 0.5 tablets by mouth 3 (three) times a week. turmerictcurcumin    [provider]  potassium chloride SA (KLOR-CON M20) 20 MEQ tablet Take 2 tablets (40 mEq total) by mouth daily. 05/09/15   [name], MD  QUERCETIN PO Take 1 capsule by mouth 3 (three) times a week.     [provider]  Resveratrol 250 MG CAPS Take 1  capsule by mouth daily.    [provider]  rivaroxaban (XARELTO) 20 MG TABS tablet TAKE 1 TABLET BY MOUTH DAILY WITH SUPPER 07/13/16   [name], MD  UBIQUINOL PO Take 1 capsule by mouth 3 (three) times a week.     [provider]    Family History Family History  Problem Relation Age of Onset  . Heart attack Mother 25  . Stroke Father 6  . Stroke Sister 62  . Heart attack Brother   . Stroke Sister   . Stroke Sister   . Heart attack Brother     Social History Social History  Substance Use Topics  . Smoking status: Never Smoker  . Smokeless tobacco: Never Used  . Alcohol use 1.2 oz/week    2 Cans of beer per week     Comment: occasionally     Allergies   Hydrocodone   Review of Systems Review of Systems  Constitutional: Negative for activity change.       All ROS Neg except as noted in HPI  HENT: Negative for nosebleeds.   Eyes: Negative for photophobia and discharge.  Respiratory: Negative for cough, shortness of breath and wheezing.   Cardiovascular: Positive for palpitations. Negative for chest pain.  Gastrointestinal: Negative for abdominal pain and blood in stool.  Genitourinary: Negative for dysuria, frequency and hematuria.  Musculoskeletal: Negative for arthralgias, back pain and neck pain.  Skin: Negative.        Tick bite  Neurological: Negative for dizziness, seizures and speech difficulty.  Psychiatric/Behavioral: Negative for confusion and hallucinations.     Physical Exam Updated Vital Signs BP (!) 184/96   Pulse 71   Temp 97.6 F (36.4 C) (Oral)   Resp 18   Ht 5' 10.5" (1.791 m)   Wt 70.3 kg (155 lb)   SpO2 99%   BMI 21.93 kg/m   Physical Exam  Constitutional: He is oriented to person, place, and time. He appears well-developed and well-nourished.  Non-toxic appearance.  HENT:  Head: Normocephalic.  Right Ear: Tympanic membrane and external ear normal.  Left Ear: Tympanic membrane and external ear normal.    Eyes: EOM and lids are normal. Pupils are equal, round, and reactive to light.  Neck: Normal range of motion. Neck supple. Carotid bruit is not present.  Cardiovascular: Normal rate, regular rhythm, normal heart sounds, intact distal pulses and normal pulses.   Pulmonary/Chest: Breath sounds normal. No respiratory distress.  Abdominal: Soft. Bowel sounds are normal. There is no tenderness. There is no guarding.  Musculoskeletal: Normal range of motion.  There is a dime size red slightly raised area on the posterior portion of the left knee. There no red streaks appreciated. The area was examined under magnification and there no retained tick body parts noted.  Lymphadenopathy:  Head (right side): No submandibular adenopathy present.       Head (left side): No submandibular adenopathy present.    He has no cervical adenopathy.  Neurological: He is alert and oriented to person, place, and time. He has normal strength. No cranial nerve deficit or sensory deficit.  Skin: Skin is warm and dry.  Psychiatric: He has a normal mood and affect. His speech is normal.  Nursing note and vitals reviewed.    ED Treatments / Results  Labs (all labs ordered are listed, but only abnormal results are displayed) Labs Reviewed - No data to display  EKG  EKG Interpretation None       Radiology No results found.  Procedures Procedures (including critical care time)  Medications Ordered in ED Medications  doxycycline (VIBRA-TABS) tablet 200 mg (not administered)     Initial Impression / Assessment and Plan / ED Course  I have reviewed the triage vital signs and the nursing notes.  Pertinent labs & imaging results that were available during my care of the patient were reviewed by me and considered in my medical decision making (see chart for details).       Final Clinical Impressions(s) / ED Diagnoses MDM Blood pressure is elevated. Patient has a history of hypertension.  It  appears that the tick has been totally removed. The patient will be treated with a single dose of doxycycline 200 mg tonight. The patient is to follow-up with his physician if any symptoms of  tick born illness or changes in his general condition. Patient and wife are in agreement with this plan.   Final diagnoses:  Tick bite, initial encounter    New Prescriptions    Lily Kocher, PA-C 06/06/17 Raynaldo Opitz, MD 06/06/17 [phone]

## 2017-06-06 NOTE — Discharge Instructions (Signed)
Your blood pressure is elevated at 184/96. Otherwise your vital signs are within normal limits. You were treated in the emergency department with the new recommendation for tick bites . 200 mg of doxycycline at one setting. Please see your doctor or return to the emergency department if any changes or problems.

## 2017-06-28 ENCOUNTER — Other Ambulatory Visit: Payer: Self-pay | Admitting: Cardiovascular Disease

## 2017-07-02 ENCOUNTER — Ambulatory Visit (INDEPENDENT_AMBULATORY_CARE_PROVIDER_SITE_OTHER): Payer: Medicare Other | Admitting: *Deleted

## 2017-07-02 DIAGNOSIS — I495 Sick sinus syndrome: Secondary | ICD-10-CM

## 2017-07-02 NOTE — Progress Notes (Signed)
Remote pacemaker transmission.   

## 2017-07-04 LAB — CUP PACEART REMOTE DEVICE CHECK
Battery Remaining Longevity: 112 mo
Battery Remaining Percentage: 95.5 %
Battery Voltage: 2.99 V
Brady Statistic AP VP Percent: 0 %
Brady Statistic AP VS Percent: 0 %
Brady Statistic AS VP Percent: 36 %
Brady Statistic AS VS Percent: 24 %
Brady Statistic RA Percent Paced: 1 %
Brady Statistic RV Percent Paced: 94 %
Date Time Interrogation Session: 20180710060027
Implantable Lead Implant Date: 20150430
Implantable Lead Implant Date: 20150430
Implantable Lead Location: 753859
Implantable Lead Location: 753860
Implantable Pulse Generator Implant Date: 20150430
Lead Channel Impedance Value: 380 Ohm
Lead Channel Impedance Value: 460 Ohm
Lead Channel Pacing Threshold Amplitude: 0.5 V
Lead Channel Pacing Threshold Amplitude: 1.25 V
Lead Channel Pacing Threshold Pulse Width: 0.4 ms
Lead Channel Pacing Threshold Pulse Width: 0.4 ms
Lead Channel Sensing Intrinsic Amplitude: 12 mV
Lead Channel Sensing Intrinsic Amplitude: 2.6 mV
Lead Channel Setting Pacing Amplitude: 2 V
Lead Channel Setting Pacing Amplitude: 2.5 V
Lead Channel Setting Pacing Pulse Width: 0.4 ms
Lead Channel Setting Sensing Sensitivity: 2 mV
Pulse Gen Model: 2240
Pulse Gen Serial Number: 7618528

## 2017-07-09 ENCOUNTER — Encounter: Payer: Self-pay | Admitting: Cardiology

## 2017-07-23 ENCOUNTER — Encounter: Payer: Self-pay | Admitting: Cardiology

## 2017-08-21 ENCOUNTER — Other Ambulatory Visit: Payer: Self-pay | Admitting: *Deleted

## 2017-08-21 ENCOUNTER — Other Ambulatory Visit: Payer: Self-pay | Admitting: Pharmacist

## 2017-08-21 MED ORDER — RIVAROXABAN 20 MG PO TABS: ORAL_TABLET | ORAL | 0 refills | Status: DC

## 2017-08-21 MED ORDER — LOSARTAN POTASSIUM 100 MG PO TABS: 100.0000 mg | ORAL_TABLET | Freq: Every day | ORAL | 0 refills | Status: DC

## 2017-08-28 DIAGNOSIS — R351 Nocturia: Secondary | ICD-10-CM | POA: Diagnosis not present

## 2017-08-28 DIAGNOSIS — N5201 Erectile dysfunction due to arterial insufficiency: Secondary | ICD-10-CM | POA: Diagnosis not present

## 2017-08-28 DIAGNOSIS — N401 Enlarged prostate with lower urinary tract symptoms: Secondary | ICD-10-CM | POA: Diagnosis not present

## 2017-08-29 ENCOUNTER — Telehealth: Payer: Self-pay | Admitting: Cardiovascular Disease

## 2017-08-29 MED ORDER — RIVAROXABAN 20 MG PO TABS: ORAL_TABLET | ORAL | 0 refills | Status: DC

## 2017-08-29 MED ORDER — LOSARTAN POTASSIUM 100 MG PO TABS: 100.0000 mg | ORAL_TABLET | Freq: Every day | ORAL | 0 refills | Status: DC

## 2017-08-29 NOTE — Telephone Encounter (Signed)
New message   Needs new 90 day prescription for these medications  *STAT* If patient is at the pharmacy, call can be transferred to refill team.   1. Which medications need to be refilled? (please list name of each medication and dose if known)  rivaroxaban (XARELTO) 20 MG TABS tablet TAKE 1 TABLET BY MOUTH DAILY WITH SUPPER   losartan (COZAAR) 100 MG tablet Take 1 tablet (100 mg total) by mouth daily.     2. Which pharmacy/location (including street and city if local pharmacy) is medication to be sent to? [facility]  Fax [phone]  escribe :[address] [place] [address]   3. Do they need a 30 day or 90 day supply?  [place]

## 2017-09-02 ENCOUNTER — Other Ambulatory Visit: Payer: Self-pay | Admitting: Pharmacist

## 2017-09-02 MED ORDER — RIVAROXABAN 20 MG PO TABS: ORAL_TABLET | ORAL | 0 refills | Status: DC

## 2017-09-24 ENCOUNTER — Ambulatory Visit (INDEPENDENT_AMBULATORY_CARE_PROVIDER_SITE_OTHER): Payer: Medicare Other | Admitting: Cardiovascular Disease

## 2017-09-24 ENCOUNTER — Encounter: Payer: Self-pay | Admitting: Cardiovascular Disease

## 2017-09-24 VITALS — BP 178/90 | HR 77 | Ht 71.0 in | Wt 146.0 lb

## 2017-09-24 DIAGNOSIS — I4819 Other persistent atrial fibrillation: Secondary | ICD-10-CM

## 2017-09-24 DIAGNOSIS — I481 Persistent atrial fibrillation: Secondary | ICD-10-CM

## 2017-09-24 DIAGNOSIS — I10 Essential (primary) hypertension: Secondary | ICD-10-CM

## 2017-09-24 DIAGNOSIS — I2510 Atherosclerotic heart disease of native coronary artery without angina pectoris: Secondary | ICD-10-CM

## 2017-09-24 DIAGNOSIS — I442 Atrioventricular block, complete: Secondary | ICD-10-CM | POA: Diagnosis not present

## 2017-09-24 DIAGNOSIS — E7800 Pure hypercholesterolemia, unspecified: Secondary | ICD-10-CM

## 2017-09-24 NOTE — Patient Instructions (Signed)

## 2017-09-24 NOTE — Assessment & Plan Note (Signed)
History of essential hypertension blood pressure measured 170/90. He is on amlodipine, hydrochlorothiazide and losartan. He says his blood pressure is usually much lower than this at home. Continue current meds I doesn't

## 2017-09-24 NOTE — Assessment & Plan Note (Signed)
History of hyperlipidemia on statin therapy followed by his PCP 

## 2017-09-24 NOTE — Assessment & Plan Note (Signed)
History of CAD status post coronary atherectomy at [facility] in 1999. He has remained stable since. He denies chest pain or shortness of breath.

## 2017-09-24 NOTE — Assessment & Plan Note (Signed)
History of complete AV block status post permanent transvenous pacemaker implantation followed by [name].

## 2017-09-24 NOTE — Progress Notes (Signed)
09/24/2017 Alvina Chou   [birth year]  [id number]  [place], [place] Primary Cardiologist: [name] MD Lupe Carney, [place]  HPI:  Karl Howell is a 83 y.o. male thin appearing married African American male father of 25, grandfather to 6 grandchildren who is a retired Administrator in the [employer]. He was a patient of Dr. Terance Ice. I last saw him in the office 08/29/16. His cardiac risk factor profile is a remarkable for treated hypertension and hyperlipidemia. His mother did die of myocardial infarction at age 83. He does not smoke. Drinks socially. He did have a coronary atherectomy in [place] in 1999 and has been stable since. He is totally asymptomatic denying chest pain or shortness of breath. He had complete heart block with symptomatic pauses and underwent DDD permanent transvenous pacemaker insertion by [name]. Interrogation of the pacemaker has shown prolonged episodes of asymptomatic atrial fibrillation and because of this he was changed from low-dose aspirin to Xarelto oral coagulation.[name] He feels much better after pacer insertion. Since I saw him a year ago he remained completely asymptomatic. He has however had some unexplained weight loss which has been worked up at the [facility]. Apparently he had excision of a mass in his back. The details are unavailable to me at this time. He was offered chemotherapy but declined. He has no complaints.   Current Meds  Medication Sig  . amLODipine (NORVASC) 2.5 MG tablet TAKE 1 TABLET DAILY  . atorvastatin (LIPITOR) 40 MG tablet Take 20 mg by mouth daily.   [name] BYSTOLIC 2.5 MG tablet TAKE 1 TABLET DAILY  . CAVERJECT 40 MCG SOLR Take one injection into the skin daily as needed for erection  . dutasteride (AVODART) 0.5 MG capsule Take 1 capsule by mouth daily.  . hydrochlorothiazide (HYDRODIURIL) 25 MG tablet Take 1 tablet (25 mg total) by mouth daily.  [name] losartan (COZAAR) 100 MG  tablet Take 1 tablet (100 mg total) by mouth daily.  . NON FORMULARY Take 0.5 tablets by mouth 3 (three) times a week. turmerictcurcumin  . potassium chloride SA (KLOR-CON M20) 20 MEQ tablet Take 2 tablets (40 mEq total) by mouth daily.  [name] QUERCETIN PO Take 1 capsule by mouth 3 (three) times a week.   [name] Resveratrol 250 MG CAPS Take 1 capsule by mouth daily.  . rivaroxaban (XARELTO) 20 MG TABS tablet TAKE 1 TABLET BY MOUTH DAILY WITH SUPPER  . UBIQUINOL PO Take 1 capsule by mouth 3 (three) times a week.      Allergies  Allergen Reactions  . Hydrocodone Anxiety    Social History   Social History  . Marital status: Married    Spouse name: N/A  . Number of children: N/A  . Years of education: N/A   Occupational History  . Not on file.   Social History Main Topics  . Smoking status: Never Smoker  . Smokeless tobacco: Never Used  . Alcohol use 1.2 oz/week    2 Cans of beer per week     Comment: occasionally  . Drug use: No  . Sexual activity: Not on file   Other Topics Concern  . Not on file   Social History Narrative  . No narrative on file     Review of Systems: General: negative for chills, fever, night sweats or weight changes.  Cardiovascular: negative for chest pain, dyspnea on exertion, edema, orthopnea, palpitations, paroxysmal nocturnal dyspnea or shortness of  breath Dermatological: negative for rash Respiratory: negative for cough or wheezing Urologic: negative for hematuria Abdominal: negative for nausea, vomiting, diarrhea, bright red blood per rectum, melena, or hematemesis Neurologic: negative for visual changes, syncope, or dizziness All other systems reviewed and are otherwise negative except as noted above.    Blood pressure (!) 178/90, pulse 77, height 5\' 11"  (1.803 m), weight 146 lb (66.2 kg).  General appearance: alert and no distress Neck: no adenopathy, no carotid bruit, no JVD, supple, symmetrical, trachea midline and thyroid not enlarged,  symmetric, no tenderness/mass/nodules Lungs: clear to auscultation bilaterally Heart: regular rate and rhythm, S1, S2 normal, no murmur, click, rub or gallop Extremities: extremities normal, atraumatic, no cyanosis or edema Pulses: 2+ and symmetric Skin: Skin color, texture, turgor normal. No rashes or lesions Neurologic: Alert and oriented X 3, normal strength and tone. Normal symmetric reflexes. Normal coordination and gait  EKG ventricular paced rhythm with underlying A. fib at a rate of 77. I personally reviewed this EKG.  ASSESSMENT AND PLAN:   Coronary artery disease History of CAD status post coronary atherectomy at [facility] in 1999. He has remained stable since. He denies chest pain or shortness of breath.  Essential hypertension History of essential hypertension blood pressure measured 170/90. He is on amlodipine, hydrochlorothiazide and losartan. He says his blood pressure is usually much lower than this at home. Continue current meds I doesn't  Hyperlipidemia History of hyperlipidemia on statin therapy followed by his PCP  Atrioventricular block, complete History of complete AV block status post permanent transvenous pacemaker implantation followed by [name].  Atrial fibrillation History of chronic atrial fibrillation on Xarelto  oral anticoagulation.      [name] MD FACP,FACC,FAHA, [facility] 09/24/2017 10:13 AM

## 2017-09-24 NOTE — Assessment & Plan Note (Signed)
History of chronic atrial fibrillation on Xarelto  oral anticoagulation.

## 2017-10-01 ENCOUNTER — Ambulatory Visit (INDEPENDENT_AMBULATORY_CARE_PROVIDER_SITE_OTHER): Payer: Medicare Other | Admitting: *Deleted

## 2017-10-01 DIAGNOSIS — I495 Sick sinus syndrome: Secondary | ICD-10-CM

## 2017-10-02 NOTE — Progress Notes (Signed)
Remote pacemaker transmission.   

## 2017-10-04 ENCOUNTER — Encounter: Payer: Self-pay | Admitting: Cardiology

## 2017-10-18 ENCOUNTER — Encounter: Payer: Self-pay | Admitting: Cardiology

## 2017-10-22 LAB — CUP PACEART REMOTE DEVICE CHECK
Battery Remaining Longevity: 111 mo
Battery Remaining Percentage: 95.5 %
Battery Voltage: 2.99 V
Brady Statistic AP VP Percent: 0 %
Brady Statistic AP VS Percent: 0 %
Brady Statistic AS VP Percent: 36 %
Brady Statistic AS VS Percent: 24 %
Brady Statistic RA Percent Paced: 1 %
Brady Statistic RV Percent Paced: 93 %
Date Time Interrogation Session: 20181009060029
Implantable Lead Implant Date: 20150430
Implantable Lead Implant Date: 20150430
Implantable Lead Location: 753859
Implantable Lead Location: 753860
Implantable Pulse Generator Implant Date: 20150430
Lead Channel Impedance Value: 360 Ohm
Lead Channel Impedance Value: 440 Ohm
Lead Channel Pacing Threshold Amplitude: 0.5 V
Lead Channel Pacing Threshold Amplitude: 1.25 V
Lead Channel Pacing Threshold Pulse Width: 0.4 ms
Lead Channel Pacing Threshold Pulse Width: 0.4 ms
Lead Channel Sensing Intrinsic Amplitude: 12 mV
Lead Channel Sensing Intrinsic Amplitude: 2.6 mV
Lead Channel Setting Pacing Amplitude: 2 V
Lead Channel Setting Pacing Amplitude: 2.5 V
Lead Channel Setting Pacing Pulse Width: 0.4 ms
Lead Channel Setting Sensing Sensitivity: 2 mV
Pulse Gen Model: 2240
Pulse Gen Serial Number: 7618528

## 2017-11-18 ENCOUNTER — Other Ambulatory Visit: Payer: Self-pay | Admitting: Cardiovascular Disease

## 2017-12-02 ENCOUNTER — Other Ambulatory Visit: Payer: Self-pay | Admitting: Cardiovascular Disease

## 2017-12-31 ENCOUNTER — Telehealth: Payer: Self-pay | Admitting: Cardiovascular Disease

## 2017-12-31 ENCOUNTER — Ambulatory Visit (INDEPENDENT_AMBULATORY_CARE_PROVIDER_SITE_OTHER): Payer: Medicare Other | Admitting: *Deleted

## 2017-12-31 DIAGNOSIS — I495 Sick sinus syndrome: Secondary | ICD-10-CM

## 2017-12-31 NOTE — Progress Notes (Signed)
Remote pacemaker transmission.   

## 2017-12-31 NOTE — Telephone Encounter (Signed)
New Message       1. Has your device fired? no  2. Is you device beeping? no  3. Are you experiencing draining or swelling at device site? no  4. Are you calling to see if we received your device transmission? yes  5. Have you passed out? no    Please route to [facility]

## 2017-12-31 NOTE — Telephone Encounter (Signed)
Spoke with patient.  Advised that transmission was received automatically.  Patient verbalizes understanding and appreciation.  Offered to schedule 03/2018 appointment per recall and patient is agreeable to appointment with [name] on 04/09/18 at [facility].  He is aware of office address and appointment info.  Patient denies additional questions or concerns at this time.

## 2018-01-01 ENCOUNTER — Encounter: Payer: Self-pay | Admitting: Cardiology

## 2018-01-06 LAB — CUP PACEART REMOTE DEVICE CHECK
Battery Remaining Longevity: 112 mo
Battery Remaining Percentage: 95.5 %
Battery Voltage: 2.99 V
Brady Statistic AP VP Percent: 25 %
Brady Statistic AP VS Percent: 0 %
Brady Statistic AS VP Percent: 65 %
Brady Statistic AS VS Percent: 5.5 %
Brady Statistic RA Percent Paced: 1.4 %
Brady Statistic RV Percent Paced: 91 %
Date Time Interrogation Session: 20190108070031
Implantable Lead Implant Date: 20150430
Implantable Lead Implant Date: 20150430
Implantable Lead Location: 753859
Implantable Lead Location: 753860
Implantable Pulse Generator Implant Date: 20150430
Lead Channel Impedance Value: 380 Ohm
Lead Channel Impedance Value: 460 Ohm
Lead Channel Pacing Threshold Amplitude: 0.5 V
Lead Channel Pacing Threshold Amplitude: 1.25 V
Lead Channel Pacing Threshold Pulse Width: 0.4 ms
Lead Channel Pacing Threshold Pulse Width: 0.4 ms
Lead Channel Sensing Intrinsic Amplitude: 12 mV
Lead Channel Sensing Intrinsic Amplitude: 2.6 mV
Lead Channel Setting Pacing Amplitude: 2 V
Lead Channel Setting Pacing Amplitude: 2.5 V
Lead Channel Setting Pacing Pulse Width: 0.4 ms
Lead Channel Setting Sensing Sensitivity: 2 mV
Pulse Gen Model: 2240
Pulse Gen Serial Number: 7618528

## 2018-01-23 DIAGNOSIS — R3915 Urgency of urination: Secondary | ICD-10-CM | POA: Diagnosis not present

## 2018-01-23 DIAGNOSIS — R3121 Asymptomatic microscopic hematuria: Secondary | ICD-10-CM | POA: Diagnosis not present

## 2018-01-23 DIAGNOSIS — N5201 Erectile dysfunction due to arterial insufficiency: Secondary | ICD-10-CM | POA: Diagnosis not present

## 2018-01-23 DIAGNOSIS — N401 Enlarged prostate with lower urinary tract symptoms: Secondary | ICD-10-CM | POA: Diagnosis not present

## 2018-02-15 DIAGNOSIS — Z950 Presence of cardiac pacemaker: Secondary | ICD-10-CM | POA: Diagnosis not present

## 2018-02-15 DIAGNOSIS — I10 Essential (primary) hypertension: Secondary | ICD-10-CM | POA: Diagnosis not present

## 2018-02-15 DIAGNOSIS — Z8673 Personal history of transient ischemic attack (TIA), and cerebral infarction without residual deficits: Secondary | ICD-10-CM | POA: Diagnosis not present

## 2018-02-15 DIAGNOSIS — Z7901 Long term (current) use of anticoagulants: Secondary | ICD-10-CM | POA: Diagnosis not present

## 2018-02-15 DIAGNOSIS — N400 Enlarged prostate without lower urinary tract symptoms: Secondary | ICD-10-CM | POA: Diagnosis not present

## 2018-02-15 DIAGNOSIS — Z48813 Encounter for surgical aftercare following surgery on the respiratory system: Secondary | ICD-10-CM | POA: Diagnosis not present

## 2018-02-15 DIAGNOSIS — I2510 Atherosclerotic heart disease of native coronary artery without angina pectoris: Secondary | ICD-10-CM | POA: Diagnosis not present

## 2018-02-19 DIAGNOSIS — I2510 Atherosclerotic heart disease of native coronary artery without angina pectoris: Secondary | ICD-10-CM | POA: Diagnosis not present

## 2018-02-19 DIAGNOSIS — I10 Essential (primary) hypertension: Secondary | ICD-10-CM | POA: Diagnosis not present

## 2018-02-19 DIAGNOSIS — Z950 Presence of cardiac pacemaker: Secondary | ICD-10-CM | POA: Diagnosis not present

## 2018-02-19 DIAGNOSIS — Z48813 Encounter for surgical aftercare following surgery on the respiratory system: Secondary | ICD-10-CM | POA: Diagnosis not present

## 2018-02-19 DIAGNOSIS — Z8673 Personal history of transient ischemic attack (TIA), and cerebral infarction without residual deficits: Secondary | ICD-10-CM | POA: Diagnosis not present

## 2018-02-19 DIAGNOSIS — N400 Enlarged prostate without lower urinary tract symptoms: Secondary | ICD-10-CM | POA: Diagnosis not present

## 2018-02-21 DIAGNOSIS — I10 Essential (primary) hypertension: Secondary | ICD-10-CM | POA: Diagnosis not present

## 2018-02-21 DIAGNOSIS — N400 Enlarged prostate without lower urinary tract symptoms: Secondary | ICD-10-CM | POA: Diagnosis not present

## 2018-02-21 DIAGNOSIS — I2510 Atherosclerotic heart disease of native coronary artery without angina pectoris: Secondary | ICD-10-CM | POA: Diagnosis not present

## 2018-02-21 DIAGNOSIS — Z950 Presence of cardiac pacemaker: Secondary | ICD-10-CM | POA: Diagnosis not present

## 2018-02-21 DIAGNOSIS — Z48813 Encounter for surgical aftercare following surgery on the respiratory system: Secondary | ICD-10-CM | POA: Diagnosis not present

## 2018-02-21 DIAGNOSIS — Z8673 Personal history of transient ischemic attack (TIA), and cerebral infarction without residual deficits: Secondary | ICD-10-CM | POA: Diagnosis not present

## 2018-02-24 DIAGNOSIS — N400 Enlarged prostate without lower urinary tract symptoms: Secondary | ICD-10-CM | POA: Diagnosis not present

## 2018-02-24 DIAGNOSIS — Z48813 Encounter for surgical aftercare following surgery on the respiratory system: Secondary | ICD-10-CM | POA: Diagnosis not present

## 2018-02-24 DIAGNOSIS — I2510 Atherosclerotic heart disease of native coronary artery without angina pectoris: Secondary | ICD-10-CM | POA: Diagnosis not present

## 2018-02-24 DIAGNOSIS — Z950 Presence of cardiac pacemaker: Secondary | ICD-10-CM | POA: Diagnosis not present

## 2018-02-24 DIAGNOSIS — Z8673 Personal history of transient ischemic attack (TIA), and cerebral infarction without residual deficits: Secondary | ICD-10-CM | POA: Diagnosis not present

## 2018-02-24 DIAGNOSIS — I10 Essential (primary) hypertension: Secondary | ICD-10-CM | POA: Diagnosis not present

## 2018-02-26 DIAGNOSIS — Z8673 Personal history of transient ischemic attack (TIA), and cerebral infarction without residual deficits: Secondary | ICD-10-CM | POA: Diagnosis not present

## 2018-02-26 DIAGNOSIS — I2510 Atherosclerotic heart disease of native coronary artery without angina pectoris: Secondary | ICD-10-CM | POA: Diagnosis not present

## 2018-02-26 DIAGNOSIS — Z48813 Encounter for surgical aftercare following surgery on the respiratory system: Secondary | ICD-10-CM | POA: Diagnosis not present

## 2018-02-26 DIAGNOSIS — N400 Enlarged prostate without lower urinary tract symptoms: Secondary | ICD-10-CM | POA: Diagnosis not present

## 2018-02-26 DIAGNOSIS — Z950 Presence of cardiac pacemaker: Secondary | ICD-10-CM | POA: Diagnosis not present

## 2018-02-26 DIAGNOSIS — I10 Essential (primary) hypertension: Secondary | ICD-10-CM | POA: Diagnosis not present

## 2018-03-03 DIAGNOSIS — Z48813 Encounter for surgical aftercare following surgery on the respiratory system: Secondary | ICD-10-CM | POA: Diagnosis not present

## 2018-03-03 DIAGNOSIS — Z950 Presence of cardiac pacemaker: Secondary | ICD-10-CM | POA: Diagnosis not present

## 2018-03-03 DIAGNOSIS — N400 Enlarged prostate without lower urinary tract symptoms: Secondary | ICD-10-CM | POA: Diagnosis not present

## 2018-03-03 DIAGNOSIS — I2510 Atherosclerotic heart disease of native coronary artery without angina pectoris: Secondary | ICD-10-CM | POA: Diagnosis not present

## 2018-03-03 DIAGNOSIS — I10 Essential (primary) hypertension: Secondary | ICD-10-CM | POA: Diagnosis not present

## 2018-03-03 DIAGNOSIS — Z8673 Personal history of transient ischemic attack (TIA), and cerebral infarction without residual deficits: Secondary | ICD-10-CM | POA: Diagnosis not present

## 2018-03-05 DIAGNOSIS — Z950 Presence of cardiac pacemaker: Secondary | ICD-10-CM | POA: Diagnosis not present

## 2018-03-05 DIAGNOSIS — Z48813 Encounter for surgical aftercare following surgery on the respiratory system: Secondary | ICD-10-CM | POA: Diagnosis not present

## 2018-03-05 DIAGNOSIS — I10 Essential (primary) hypertension: Secondary | ICD-10-CM | POA: Diagnosis not present

## 2018-03-05 DIAGNOSIS — N400 Enlarged prostate without lower urinary tract symptoms: Secondary | ICD-10-CM | POA: Diagnosis not present

## 2018-03-05 DIAGNOSIS — Z8673 Personal history of transient ischemic attack (TIA), and cerebral infarction without residual deficits: Secondary | ICD-10-CM | POA: Diagnosis not present

## 2018-03-05 DIAGNOSIS — I2510 Atherosclerotic heart disease of native coronary artery without angina pectoris: Secondary | ICD-10-CM | POA: Diagnosis not present

## 2018-03-10 DIAGNOSIS — I10 Essential (primary) hypertension: Secondary | ICD-10-CM | POA: Diagnosis not present

## 2018-03-10 DIAGNOSIS — Z48813 Encounter for surgical aftercare following surgery on the respiratory system: Secondary | ICD-10-CM | POA: Diagnosis not present

## 2018-03-10 DIAGNOSIS — N400 Enlarged prostate without lower urinary tract symptoms: Secondary | ICD-10-CM | POA: Diagnosis not present

## 2018-03-10 DIAGNOSIS — I2510 Atherosclerotic heart disease of native coronary artery without angina pectoris: Secondary | ICD-10-CM | POA: Diagnosis not present

## 2018-03-10 DIAGNOSIS — Z950 Presence of cardiac pacemaker: Secondary | ICD-10-CM | POA: Diagnosis not present

## 2018-03-10 DIAGNOSIS — Z8673 Personal history of transient ischemic attack (TIA), and cerebral infarction without residual deficits: Secondary | ICD-10-CM | POA: Diagnosis not present

## 2018-03-12 DIAGNOSIS — Z950 Presence of cardiac pacemaker: Secondary | ICD-10-CM | POA: Diagnosis not present

## 2018-03-12 DIAGNOSIS — Z8673 Personal history of transient ischemic attack (TIA), and cerebral infarction without residual deficits: Secondary | ICD-10-CM | POA: Diagnosis not present

## 2018-03-12 DIAGNOSIS — I10 Essential (primary) hypertension: Secondary | ICD-10-CM | POA: Diagnosis not present

## 2018-03-12 DIAGNOSIS — Z48813 Encounter for surgical aftercare following surgery on the respiratory system: Secondary | ICD-10-CM | POA: Diagnosis not present

## 2018-03-12 DIAGNOSIS — N400 Enlarged prostate without lower urinary tract symptoms: Secondary | ICD-10-CM | POA: Diagnosis not present

## 2018-03-12 DIAGNOSIS — I2510 Atherosclerotic heart disease of native coronary artery without angina pectoris: Secondary | ICD-10-CM | POA: Diagnosis not present

## 2018-03-15 ENCOUNTER — Encounter (HOSPITAL_COMMUNITY): Payer: Self-pay | Admitting: Emergency Medicine

## 2018-03-15 ENCOUNTER — Emergency Department (HOSPITAL_COMMUNITY)
Admission: EM | Admit: 2018-03-15 | Discharge: 2018-03-15 | Disposition: A | Discharge status: Home / Self Care | Payer: Medicare Other | Attending: Emergency Medicine | Admitting: Emergency Medicine

## 2018-03-15 ENCOUNTER — Other Ambulatory Visit: Payer: Self-pay

## 2018-03-15 DIAGNOSIS — Z950 Presence of cardiac pacemaker: Secondary | ICD-10-CM | POA: Diagnosis not present

## 2018-03-15 DIAGNOSIS — Z7901 Long term (current) use of anticoagulants: Secondary | ICD-10-CM | POA: Insufficient documentation

## 2018-03-15 DIAGNOSIS — Z79899 Other long term (current) drug therapy: Secondary | ICD-10-CM | POA: Insufficient documentation

## 2018-03-15 DIAGNOSIS — I2510 Atherosclerotic heart disease of native coronary artery without angina pectoris: Secondary | ICD-10-CM | POA: Insufficient documentation

## 2018-03-15 DIAGNOSIS — Y712 Prosthetic and other implants, materials and accessory cardiovascular devices associated with adverse incidents: Secondary | ICD-10-CM | POA: Diagnosis not present

## 2018-03-15 DIAGNOSIS — I4892 Unspecified atrial flutter: Secondary | ICD-10-CM | POA: Diagnosis not present

## 2018-03-15 DIAGNOSIS — T829XXA Unspecified complication of cardiac and vascular prosthetic device, implant and graft, initial encounter: Secondary | ICD-10-CM | POA: Diagnosis not present

## 2018-03-15 DIAGNOSIS — I10 Essential (primary) hypertension: Secondary | ICD-10-CM | POA: Insufficient documentation

## 2018-03-15 DIAGNOSIS — T82897A Other specified complication of cardiac prosthetic devices, implants and grafts, initial encounter: Secondary | ICD-10-CM | POA: Diagnosis not present

## 2018-03-15 NOTE — ED Notes (Signed)
Gave EKG to Dr. Rogene Houston. Currently trying to interrogate pace maker

## 2018-03-15 NOTE — ED Triage Notes (Signed)
Patient reports his pacemaker alarm went off at home. [employer] medical pacemaker. Patient denies any cardiac symptoms.

## 2018-03-15 NOTE — ED Notes (Signed)
Pacemaker successfully interrogated and sent to [facility].

## 2018-03-15 NOTE — ED Notes (Signed)
[employer] Interrogator successfully interrogated. They will call us with the information.

## 2018-03-15 NOTE — ED Provider Notes (Signed)
[facility] EMERGENCY DEPARTMENT Provider Note   CSN: [id number] Arrival date & time: 03/15/18  1511     History   Chief Complaint Chief Complaint  Patient presents with  . Pacemaker Problem    HPI Karl Howell is a 84 y.o. male.  Patient with long-standing pacemaker.  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  Subsequently patient has developed atrial fibrillation he is on blood thinners for this.  Patient has been feeling fine.  However his base station was making loud noises.  Patient was concerned something was wrong with his pacemaker but he felt fine.     Past Medical History:  Diagnosis Date  . Arthritis   . Coronary artery disease    ATHERECTOMY DONE IN [place].  [name] HTN (hypertension)   . Hyperlipidemia   . Paroxysmal atrial fibrillation (HCC)   . Symptomatic bradycardia    status post permanent transvenous pacemaker insertion by [name]    Patient Active Problem List   Diagnosis Date Noted  . Atrial fibrillation ([place]) 05/25/2015  . Pacemaker 08/24/2014  . Sinus pause 04/22/2014  . Atrioventricular block, complete ([place]) 04/22/2014  . Bradycardia 04/19/2014  . Dizziness 04/19/2014  . Coronary artery disease 10/29/2013  . Essential hypertension 10/29/2013  . Hyperlipidemia 10/29/2013  . HIP PAIN 05/11/2010  . SPINAL STENOSIS, LUMBAR 07/22/2008  . DEGENERATIVE JOINT DISEASE, KNEE 12/11/2007  . KNEE PAIN 12/11/2007    Past Surgical History:  Procedure Laterality Date  . CARDIAC CATHETERIZATION     1996    "cleaned out artery out"....no problems since  . CORONARY ANGIOPLASTY     1996  ??? in [place], [place]  . MULTIPLE TOOTH EXTRACTIONS    . PACEMAKER INSERTION  04-22-2014   STJ dual chamber pacemaker implanted for symptomatic sinus pauses  . RENAL ARTERY DUPLEX  03/11/13   NORMAL.  [name] STRESS MYOCARDIAL PERFUSION STUDY  09/18/10   MILD TO MODERATE PERFUSION DEFECT IN THE BASAL INFERIOR AND MID INFERIOR REGION. THIS IS CONSISTANT WITH  INFARCT/SCAR. NO ISCHEMIA.  . TONSILLECTOMY    . TOTAL KNEE ARTHROPLASTY  11/28/2011   Procedure: TOTAL KNEE ARTHROPLASTY;  Surgeon: [name], MD;  Location: [place];  Service: Orthopedics;  Laterality: Left;  . TRANSTHORACIC ECHOCARDIOGRAM  03/11/13   EF 55% TO 60%. GRADE 1 DIASTOLIC DYSFUNCTION. VENTRICULAR SEPTUM: THICKNESS IS MILDLY INCREASED. SEPTAL MOTION SHOWED ABNORMAL FUNCTION AND DYSSYNERGY. MV: MILD REGURG.         Home Medications    Prior to Admission medications   Medication Sig Start Date End Date Taking? Authorizing Provider  amLODipine (NORVASC) 2.5 MG tablet TAKE 1 TABLET DAILY 06/28/17   Baldwin Jamaica, PA-C  atorvastatin (LIPITOR) 40 MG tablet Take 20 mg by mouth daily.     [provider]  BYSTOLIC 2.5 MG tablet TAKE 1 TABLET DAILY 11/18/17   [name], MD  CAVERJECT 40 MCG SOLR Take one injection into the skin daily as needed for erection 07/20/16   [provider]  dutasteride (AVODART) 0.5 MG capsule Take 1 capsule by mouth daily. 05/10/16   [provider]  hydrochlorothiazide (HYDRODIURIL) 25 MG tablet Take 1 tablet (25 mg total) by mouth daily. 10/18/15   [name], MD  losartan (COZAAR) 100 MG tablet TAKE 1 TABLET DAILY 12/03/17   [name], MD  NON FORMULARY Take 0.5 tablets by mouth 3 (three) times a week. turmerictcurcumin    [provider]  potassium chloride  SA (KLOR-CON M20) 20 MEQ tablet Take 2 tablets (40 mEq total) by mouth daily. 05/09/15   [name], MD  QUERCETIN PO Take 1 capsule by mouth 3 (three) times a week.     [provider]  Resveratrol 250 MG CAPS Take 1 capsule by mouth daily.    [provider]  rivaroxaban (XARELTO) 20 MG TABS tablet TAKE 1 TABLET BY MOUTH DAILY WITH SUPPER 09/02/17   [name], MD  UBIQUINOL PO Take 1 capsule by mouth 3 (three) times a week.     [provider]  XARELTO 20 MG TABS tablet TAKE 1 TABLET DAILY WITH  SUPPER 12/03/17   [name], MD    Family History Family History  Problem Relation Age of Onset  . Heart attack Mother 78  . Stroke Father 70  . Stroke Sister 49  . Heart attack Brother   . Stroke Sister   . Stroke Sister   . Heart attack Brother     Social History Social History   Tobacco Use  . Smoking status: Never Smoker  . Smokeless tobacco: Never Used  Substance Use Topics  . Alcohol use: Yes    Alcohol/week: 1.2 oz    Types: 2 Cans of beer per week    Comment: occasionally  . Drug use: No     Allergies   Hydrocodone   Review of Systems Review of Systems  Constitutional: Negative for fever.  HENT: Negative for congestion.   Eyes: Negative for visual disturbance.  Respiratory: Negative for shortness of breath.   Cardiovascular: Negative for chest pain.  Gastrointestinal: Negative for abdominal pain.  Musculoskeletal: Negative for myalgias.  Skin: Negative for rash.  Neurological: Negative for syncope and headaches.  Hematological: Bruises/bleeds easily.  Psychiatric/Behavioral: Negative for confusion.     Physical Exam Updated Vital Signs BP 130/62   Pulse 70   Temp 99.5 F (37.5 C) (Axillary)   Resp (!) 24   Ht 1.803 m (5\' 11" )   Wt 63.5 kg (140 lb)   SpO2 99%   BMI 19.53 kg/m   Physical Exam  Constitutional: He is oriented to person, place, and time. He appears well-developed and well-nourished. No distress.  HENT:  Head: Normocephalic and atraumatic.  Eyes: Pupils are equal, round, and reactive to light. EOM are normal.  Neck: Normal range of motion.  Cardiovascular: Normal rate, regular rhythm and normal heart sounds.  Pulmonary/Chest: Effort normal and breath sounds normal.  Abdominal: Soft. Bowel sounds are normal. There is no tenderness.  Musculoskeletal: Normal range of motion.  Neurological: He is alert and oriented to person, place, and time. No cranial nerve deficit or sensory deficit. He exhibits normal muscle tone.  Coordination normal.  Skin: Skin is warm.  Nursing note and vitals reviewed.    ED Treatments / Results  Labs (all labs ordered are listed, but only abnormal results are displayed) Labs Reviewed - No data to display  EKG EKG Interpretation  Date/Time:  Saturday March 15 2018 15:56:28 EDT Ventricular Rate:  72 PR Interval:    QRS Duration: 129 QT Interval:  470 QTC Calculation: 532 R Axis:   -57 Text Interpretation:  Afib/flut and V-paced complexes No further analysis attempted due to paced rhythm Confirmed by Fredia Sorrow [phone]) on 03/15/2018 4:17:37 PM     EKG Interpretation  Date/Time:  Saturday March 15 2018 15:56:28 EDT Ventricular Rate:  72 PR Interval:    QRS Duration: 129 QT Interval:  470 QTC  Calculation: 532 R Axis:   -57 Text Interpretation:  Afib/flut and V-paced complexes No further analysis attempted due to paced rhythm Confirmed by Fredia Sorrow [phone]) on 03/15/2018 4:17:37 PM      ED ECG REPORT   Date: 03/15/2018  Rate: 72  Rhythm: A. fib flutter and V paced complexes paced rhythm  QRS Axis: indeterminate  Intervals: normal  ST/T Wave abnormalities: nonspecific ST/T changes  Conduction Disutrbances:nonspecific intraventricular conduction delay  Narrative Interpretation:   Old EKG Reviewed: none available  I have personally reviewed the EKG tracing and agree with the computerized printout as noted. EKG would not crossover from MUSE.  Patient had long-standing pacemaker.  EKG reflects ventricular paced complexes.  Radiology No results found.  Procedures Procedures (including critical care time)  Medications Ordered in ED Medications - No data to display   Initial Impression / Assessment and Plan / ED Course  I have reviewed the triage vital signs and the nursing notes.  Pertinent labs & imaging results that were available during my care of the patient were reviewed by me and considered in my medical decision making (see chart for  details).     Patient nontoxic no acute distress.  Interrogation of [facility] pacemaker showed no problems.  I believe it was ongoing was that his face station due to the electricity being out was alerting that it did not have pallor.  Appears to be no problem with the pacemaker itself.  Patient also was asymptomatic.  Patient stable for discharge home and follow-up with cardiology.    Final Clinical Impressions(s) / ED Diagnoses   Final diagnoses:  Disorder of cardiac pacemaker system, initial encounter    ED Discharge Orders    None       Fredia Sorrow, MD 03/15/18 [phone]

## 2018-03-15 NOTE — Discharge Instructions (Addendum)
Interrogation of the pacemaker shows everything seems to be working fine.  Follow-up with your cardiologist as needed return for any new or worse symptoms.

## 2018-03-20 DIAGNOSIS — N400 Enlarged prostate without lower urinary tract symptoms: Secondary | ICD-10-CM | POA: Diagnosis not present

## 2018-03-20 DIAGNOSIS — I2510 Atherosclerotic heart disease of native coronary artery without angina pectoris: Secondary | ICD-10-CM | POA: Diagnosis not present

## 2018-03-20 DIAGNOSIS — Z950 Presence of cardiac pacemaker: Secondary | ICD-10-CM | POA: Diagnosis not present

## 2018-03-20 DIAGNOSIS — Z48813 Encounter for surgical aftercare following surgery on the respiratory system: Secondary | ICD-10-CM | POA: Diagnosis not present

## 2018-03-20 DIAGNOSIS — Z8673 Personal history of transient ischemic attack (TIA), and cerebral infarction without residual deficits: Secondary | ICD-10-CM | POA: Diagnosis not present

## 2018-03-20 DIAGNOSIS — I10 Essential (primary) hypertension: Secondary | ICD-10-CM | POA: Diagnosis not present

## 2018-03-26 DIAGNOSIS — Z950 Presence of cardiac pacemaker: Secondary | ICD-10-CM | POA: Diagnosis not present

## 2018-03-26 DIAGNOSIS — I10 Essential (primary) hypertension: Secondary | ICD-10-CM | POA: Diagnosis not present

## 2018-03-26 DIAGNOSIS — I2510 Atherosclerotic heart disease of native coronary artery without angina pectoris: Secondary | ICD-10-CM | POA: Diagnosis not present

## 2018-03-26 DIAGNOSIS — N400 Enlarged prostate without lower urinary tract symptoms: Secondary | ICD-10-CM | POA: Diagnosis not present

## 2018-03-26 DIAGNOSIS — Z48813 Encounter for surgical aftercare following surgery on the respiratory system: Secondary | ICD-10-CM | POA: Diagnosis not present

## 2018-03-26 DIAGNOSIS — Z8673 Personal history of transient ischemic attack (TIA), and cerebral infarction without residual deficits: Secondary | ICD-10-CM | POA: Diagnosis not present

## 2018-03-27 ENCOUNTER — Telehealth: Payer: Self-pay | Admitting: Internal Medicine

## 2018-03-27 NOTE — Telephone Encounter (Signed)
Pt came into our office stating something is wrong with the box at his house that he uses to do remote checks. He was seen at the ER here at [facility] on 03/15/18. He is really anxious and wanting to figure out what is going on. Please give pt a call @ [phone]

## 2018-03-27 NOTE — Telephone Encounter (Signed)
LVM on home answering machine ( ok per DPR) informing pt that his home monitor was working and that a transmission was reviewed today and it was normal for the pt.

## 2018-03-31 DIAGNOSIS — Z48813 Encounter for surgical aftercare following surgery on the respiratory system: Secondary | ICD-10-CM | POA: Diagnosis not present

## 2018-03-31 DIAGNOSIS — N400 Enlarged prostate without lower urinary tract symptoms: Secondary | ICD-10-CM | POA: Diagnosis not present

## 2018-03-31 DIAGNOSIS — I10 Essential (primary) hypertension: Secondary | ICD-10-CM | POA: Diagnosis not present

## 2018-03-31 DIAGNOSIS — I2510 Atherosclerotic heart disease of native coronary artery without angina pectoris: Secondary | ICD-10-CM | POA: Diagnosis not present

## 2018-03-31 DIAGNOSIS — Z8673 Personal history of transient ischemic attack (TIA), and cerebral infarction without residual deficits: Secondary | ICD-10-CM | POA: Diagnosis not present

## 2018-03-31 DIAGNOSIS — Z950 Presence of cardiac pacemaker: Secondary | ICD-10-CM | POA: Diagnosis not present

## 2018-04-01 ENCOUNTER — Ambulatory Visit (INDEPENDENT_AMBULATORY_CARE_PROVIDER_SITE_OTHER): Payer: Medicare Other | Admitting: *Deleted

## 2018-04-01 DIAGNOSIS — I495 Sick sinus syndrome: Secondary | ICD-10-CM

## 2018-04-01 NOTE — Progress Notes (Signed)
Remote pacemaker transmission.   

## 2018-04-03 ENCOUNTER — Encounter: Payer: Self-pay | Admitting: Cardiology

## 2018-04-09 ENCOUNTER — Ambulatory Visit (INDEPENDENT_AMBULATORY_CARE_PROVIDER_SITE_OTHER): Payer: Medicare Other | Admitting: Internal Medicine

## 2018-04-09 ENCOUNTER — Encounter: Payer: Self-pay | Admitting: Internal Medicine

## 2018-04-09 VITALS — BP 130/82 | HR 72 | Ht 71.0 in | Wt 141.0 lb

## 2018-04-09 DIAGNOSIS — Z950 Presence of cardiac pacemaker: Secondary | ICD-10-CM

## 2018-04-09 DIAGNOSIS — I495 Sick sinus syndrome: Secondary | ICD-10-CM

## 2018-04-09 DIAGNOSIS — I10 Essential (primary) hypertension: Secondary | ICD-10-CM | POA: Diagnosis not present

## 2018-04-09 LAB — CUP PACEART INCLINIC DEVICE CHECK
Battery Remaining Longevity: 115 mo
Battery Voltage: 2.99 V
Brady Statistic RA Percent Paced: 3.2 %
Brady Statistic RV Percent Paced: 89 %
Date Time Interrogation Session: 20190417100519
Implantable Lead Implant Date: 20150430
Implantable Lead Implant Date: 20150430
Implantable Lead Location: 753859
Implantable Lead Location: 753860
Implantable Pulse Generator Implant Date: 20150430
Lead Channel Impedance Value: 362.5 Ohm
Lead Channel Impedance Value: 450 Ohm
Lead Channel Pacing Threshold Amplitude: 1 V
Lead Channel Pacing Threshold Amplitude: 1 V
Lead Channel Pacing Threshold Pulse Width: 0.4 ms
Lead Channel Pacing Threshold Pulse Width: 0.4 ms
Lead Channel Sensing Intrinsic Amplitude: 0.5 mV
Lead Channel Sensing Intrinsic Amplitude: 10.6 mV
Lead Channel Setting Pacing Amplitude: 2 V
Lead Channel Setting Pacing Amplitude: 2.5 V
Lead Channel Setting Pacing Pulse Width: 0.4 ms
Lead Channel Setting Sensing Sensitivity: 2 mV
Pulse Gen Model: 2240
Pulse Gen Serial Number: 7618528

## 2018-04-09 NOTE — Progress Notes (Addendum)
HPI Karl Howell returns today for followup. He is a very pleasant 84 year old man with a history of hypertension, symptomatic bradycardia due to high-grade heart block, status post permanent pacemaker insertion. He also is a history of sinus node dysfunction. The patient has coronary disease and is asymptomatic. Since his pacemaker was placed he has occaisional palpitations and non-exertional chest pain. No edema. He is anxious because of his CA treatments for which he is receiving chemotherapy.  Allergies  Allergen Reactions  . Hydrocodone Anxiety     Current Outpatient Medications  Medication Sig Dispense Refill  . amLODipine (NORVASC) 2.5 MG tablet TAKE 1 TABLET DAILY 90 tablet 1  . atorvastatin (LIPITOR) 40 MG tablet Take 20 mg by mouth daily.     [name] BYSTOLIC 2.5 MG tablet TAKE 1 TABLET DAILY 90 tablet 1  . dutasteride (AVODART) 0.5 MG capsule Take 1 capsule by mouth daily.  3  . hydrochlorothiazide (HYDRODIURIL) 25 MG tablet Take 1 tablet (25 mg total) by mouth daily. 90 tablet 2  . losartan (COZAAR) 100 MG tablet TAKE 1 TABLET DAILY 90 tablet 1  . NON FORMULARY Take 0.5 tablets by mouth 3 (three) times a week. turmerictcurcumin    . potassium chloride SA (KLOR-CON M20) 20 MEQ tablet Take 2 tablets (40 mEq total) by mouth daily. 60 tablet 0  . Resveratrol 250 MG CAPS Take 1 capsule by mouth daily.    . rivaroxaban (XARELTO) 20 MG TABS tablet TAKE 1 TABLET BY MOUTH DAILY WITH SUPPER 90 tablet 0  . UBIQUINOL PO Take 1 capsule by mouth daily.     [name] CAVERJECT 40 MCG SOLR Take one injection into the skin daily as needed for erection    . QUERCETIN PO Take 1 capsule by mouth 3 (three) times a week.      No current facility-administered medications for this visit.      Past Medical History:  Diagnosis Date  . Arthritis   . Coronary artery disease    ATHERECTOMY DONE IN [place].  [name] HTN (hypertension)   . Hyperlipidemia   . Paroxysmal atrial fibrillation (HCC)   .  Symptomatic bradycardia    status post permanent transvenous pacemaker insertion by [name]    ROS:   All systems reviewed and negative except as noted in the HPI.   Past Surgical History:  Procedure Laterality Date  . CARDIAC CATHETERIZATION     1996    "cleaned out artery out"....no problems since  . CORONARY ANGIOPLASTY     1996  ??? in [place], [place]  . MULTIPLE TOOTH EXTRACTIONS    . PACEMAKER INSERTION  04-22-2014   STJ dual chamber pacemaker implanted for symptomatic sinus pauses  . RENAL ARTERY DUPLEX  03/11/13   NORMAL.  [name] STRESS MYOCARDIAL PERFUSION STUDY  09/18/10   MILD TO MODERATE PERFUSION DEFECT IN THE BASAL INFERIOR AND MID INFERIOR REGION. THIS IS CONSISTANT WITH INFARCT/SCAR. NO ISCHEMIA.  . TONSILLECTOMY    . TOTAL KNEE ARTHROPLASTY  11/28/2011   Procedure: TOTAL KNEE ARTHROPLASTY;  Surgeon: [name], MD;  Location: [facility];  Service: Orthopedics;  Laterality: Left;  . TRANSTHORACIC ECHOCARDIOGRAM  03/11/13   EF 55% TO 60%. GRADE 1 DIASTOLIC DYSFUNCTION. VENTRICULAR SEPTUM: THICKNESS IS MILDLY INCREASED. SEPTAL MOTION SHOWED ABNORMAL FUNCTION AND DYSSYNERGY. MV: MILD REGURG.      Family History  Problem Relation Age of Onset  . Heart attack Mother 72  . Stroke Father 61  .  Stroke Sister 39  . Heart attack Brother   . Stroke Sister   . Stroke Sister   . Heart attack Brother      Social History   Socioeconomic History  . Marital status: Married    Spouse name: Not on file  . Number of children: Not on file  . Years of education: Not on file  . Highest education level: Not on file  Occupational History  . Not on file  Social Needs  . Financial resource strain: Not on file  . Food insecurity:    Worry: Not on file    Inability: Not on file  . Transportation needs:    Medical: Not on file    Non-medical: Not on file  Tobacco Use  . Smoking status: Never Smoker  . Smokeless tobacco: Never Used  Substance and Sexual Activity  . Alcohol use:  Yes    Alcohol/week: 1.2 oz    Types: 2 Cans of beer per week    Comment: occasionally  . Drug use: No  . Sexual activity: Not on file  Lifestyle  . Physical activity:    Days per week: Not on file    Minutes per session: Not on file  . Stress: Not on file  Relationships  . Social connections:    Talks on phone: Not on file    Gets together: Not on file    Attends religious service: Not on file    Active member of club or organization: Not on file    Attends meetings of clubs or organizations: Not on file    Relationship status: Not on file  . Intimate partner violence:    Fear of current or ex partner: Not on file    Emotionally abused: Not on file    Physically abused: Not on file    Forced sexual activity: Not on file  Other Topics Concern  . Not on file  Social History Narrative  . Not on file     BP 130/82   Pulse 72   Ht 5\' 11"  (1.803 m)   Wt 141 lb (64 kg)   BMI 19.67 kg/m   Physical Exam:  Well appearing 84 yo man, NAD HEENT: Unremarkable Neck:  6 cm JVD, no thyromegally Lymphatics:  No adenopathy Back:  No CVA tenderness Lungs:  Clear with no wheezes HEART:  Regular rate rhythm, no murmurs, no rubs, no clicks Abd:  soft, positive bowel sounds, no organomegally, no rebound, no guarding Ext:  2 plus pulses, no edema, no cyanosis, no clubbing Skin:  No rashes no nodules Neuro:  CN II through XII intact, motor grossly intact  EKG - atrial fib with ventricular pacing and PVC's  DEVICE  Normal device function.  See PaceArt for details.   Assess/Plan: 1. CHB - he is asymptomatic, s/p PPM insertion 2. Atrial fib - he is asymptomatic and his rates are well controlled. He will continue systemic anti-coagulation 3. PPM - his [employer] DDD device is working normally. 4. Chest pain - he has non-cardiac chest pain. I have discussed the symptoms he might experience if he is having angina and to call us if this develops.  Mikle Bosworth.D.

## 2018-04-09 NOTE — Patient Instructions (Signed)
Medication Instructions:  Your physician recommends that you continue on your current medications as directed. Please refer to the Current Medication list given to you today.  Labwork: None ordered.  Testing/Procedures: None ordered.  Follow-Up: Your physician wants you to follow-up in: one year with [name].   You will receive a reminder letter in the mail two months in advance. If you don't receive a letter, please call our office to schedule the follow-up appointment.  Remote monitoring is used to monitor your Pacemaker from home. This monitoring reduces the number of office visits required to check your device to one time per year. It allows [place] to keep an eye on the functioning of your device to ensure it is working properly. You are scheduled for a device check from home on 07/01/2018. You may send your transmission at any time that day. If you have a wireless device, the transmission will be sent automatically. After your physician reviews your transmission, you will receive a postcard with your next transmission date.  Any Other Special Instructions Will Be Listed Below (If Applicable).  If you need a refill on your cardiac medications before your next appointment, please call your pharmacy.

## 2018-04-23 LAB — CUP PACEART REMOTE DEVICE CHECK
Battery Remaining Longevity: 112 mo
Battery Remaining Percentage: 95.5 %
Battery Voltage: 2.99 V
Brady Statistic AP VP Percent: 37 %
Brady Statistic AP VS Percent: 1 %
Brady Statistic AS VP Percent: 58 %
Brady Statistic AS VS Percent: 2 %
Brady Statistic RA Percent Paced: 3 %
Brady Statistic RV Percent Paced: 90 %
Date Time Interrogation Session: 20190409060015
Implantable Lead Implant Date: 20150430
Implantable Lead Implant Date: 20150430
Implantable Lead Location: 753859
Implantable Lead Location: 753860
Implantable Pulse Generator Implant Date: 20150430
Lead Channel Impedance Value: 350 Ohm
Lead Channel Impedance Value: 450 Ohm
Lead Channel Pacing Threshold Amplitude: 0.5 V
Lead Channel Pacing Threshold Amplitude: 1.25 V
Lead Channel Pacing Threshold Pulse Width: 0.4 ms
Lead Channel Pacing Threshold Pulse Width: 0.4 ms
Lead Channel Sensing Intrinsic Amplitude: 0.5 mV
Lead Channel Sensing Intrinsic Amplitude: 7.2 mV
Lead Channel Setting Pacing Amplitude: 2 V
Lead Channel Setting Pacing Amplitude: 2.5 V
Lead Channel Setting Pacing Pulse Width: 0.4 ms
Lead Channel Setting Sensing Sensitivity: 2 mV
Pulse Gen Model: 2240
Pulse Gen Serial Number: 7618528

## 2018-05-17 ENCOUNTER — Other Ambulatory Visit: Payer: Self-pay | Admitting: Cardiovascular Disease

## 2018-05-20 NOTE — Telephone Encounter (Signed)
Rx(s) sent to pharmacy electronically.  

## 2018-05-26 ENCOUNTER — Other Ambulatory Visit: Payer: Self-pay | Admitting: Cardiovascular Disease

## 2018-05-27 NOTE — Telephone Encounter (Signed)
Rx has been sent to the pharmacy electronically. ° °

## 2018-05-27 NOTE — Telephone Encounter (Signed)
This is Dr. Berry's pt 

## 2018-06-14 ENCOUNTER — Emergency Department (HOSPITAL_COMMUNITY): Payer: Medicare Other

## 2018-06-14 ENCOUNTER — Encounter (HOSPITAL_COMMUNITY): Payer: Self-pay | Admitting: *Deleted

## 2018-06-14 ENCOUNTER — Emergency Department (HOSPITAL_COMMUNITY)
Admission: EM | Admit: 2018-06-14 | Discharge: 2018-06-15 | Disposition: A | Discharge status: Home / Self Care | Payer: Medicare Other | Attending: Emergency Medicine | Admitting: Emergency Medicine

## 2018-06-14 ENCOUNTER — Other Ambulatory Visit: Payer: Self-pay

## 2018-06-14 DIAGNOSIS — R42 Dizziness and giddiness: Secondary | ICD-10-CM | POA: Diagnosis not present

## 2018-06-14 DIAGNOSIS — I10 Essential (primary) hypertension: Secondary | ICD-10-CM | POA: Diagnosis not present

## 2018-06-14 DIAGNOSIS — Z950 Presence of cardiac pacemaker: Secondary | ICD-10-CM | POA: Diagnosis not present

## 2018-06-14 DIAGNOSIS — S0990XA Unspecified injury of head, initial encounter: Secondary | ICD-10-CM | POA: Diagnosis not present

## 2018-06-14 DIAGNOSIS — E860 Dehydration: Secondary | ICD-10-CM | POA: Insufficient documentation

## 2018-06-14 DIAGNOSIS — I480 Paroxysmal atrial fibrillation: Secondary | ICD-10-CM | POA: Insufficient documentation

## 2018-06-14 DIAGNOSIS — R531 Weakness: Secondary | ICD-10-CM

## 2018-06-14 DIAGNOSIS — M6281 Muscle weakness (generalized): Secondary | ICD-10-CM | POA: Diagnosis not present

## 2018-06-14 DIAGNOSIS — C3490 Malignant neoplasm of unspecified part of unspecified bronchus or lung: Secondary | ICD-10-CM | POA: Diagnosis not present

## 2018-06-14 DIAGNOSIS — I2510 Atherosclerotic heart disease of native coronary artery without angina pectoris: Secondary | ICD-10-CM | POA: Diagnosis not present

## 2018-06-14 DIAGNOSIS — Z79899 Other long term (current) drug therapy: Secondary | ICD-10-CM | POA: Diagnosis not present

## 2018-06-14 DIAGNOSIS — Z7901 Long term (current) use of anticoagulants: Secondary | ICD-10-CM | POA: Diagnosis not present

## 2018-06-14 DIAGNOSIS — Z96652 Presence of left artificial knee joint: Secondary | ICD-10-CM | POA: Diagnosis not present

## 2018-06-14 DIAGNOSIS — R296 Repeated falls: Secondary | ICD-10-CM | POA: Insufficient documentation

## 2018-06-14 DIAGNOSIS — E876 Hypokalemia: Secondary | ICD-10-CM | POA: Diagnosis not present

## 2018-06-14 HISTORY — DX: Malignant (primary) neoplasm, unspecified: C80.1

## 2018-06-14 LAB — COMPREHENSIVE METABOLIC PANEL
ALT: 21 U/L (ref 17–63)
AST: 28 U/L (ref 15–41)
Albumin: 4.4 g/dL (ref 3.5–5.0)
Alkaline Phosphatase: 55 U/L (ref 38–126)
Anion gap: 9 (ref 5–15)
BUN: 13 mg/dL (ref 6–20)
CO2: 31 mmol/L (ref 22–32)
Calcium: 9.6 mg/dL (ref 8.9–10.3)
Chloride: 99 mmol/L — ABNORMAL LOW (ref 101–111)
Creatinine, Ser: 1.02 mg/dL (ref 0.61–1.24)
GFR calc Af Amer: >60 mL/min (ref >60)
GFR calc non Af Amer: >60 mL/min (ref >60)
Glucose, Bld: 108 mg/dL — ABNORMAL HIGH (ref 65–99)
Potassium: 2.7 mmol/L — CL (ref 3.5–5.1)
Sodium: 139 mmol/L (ref 135–145)
Total Bilirubin: 0.9 mg/dL (ref 0.3–1.2)
Total Protein: 8 g/dL (ref 6.5–8.1)

## 2018-06-14 LAB — CBC WITH DIFFERENTIAL/PLATELET
Basophils Absolute: 0 10*3/uL (ref 0.0–0.1)
Basophils Relative: 0 %
Eosinophils Absolute: 0.1 10*3/uL (ref 0.0–0.7)
Eosinophils Relative: 2 %
HCT: 40 % (ref 39.0–52.0)
Hemoglobin: 12.7 g/dL — ABNORMAL LOW (ref 13.0–17.0)
Lymphocytes Relative: 25 %
Lymphs Abs: 1.5 10*3/uL (ref 0.7–4.0)
MCH: 27 pg (ref 26.0–34.0)
MCHC: 31.8 g/dL (ref 30.0–36.0)
MCV: 85.1 fL (ref 78.0–100.0)
Monocytes Absolute: 0.6 10*3/uL (ref 0.1–1.0)
Monocytes Relative: 10 %
Neutro Abs: 3.7 10*3/uL (ref 1.7–7.7)
Neutrophils Relative %: 63 %
Platelets: 166 10*3/uL (ref 150–400)
RBC: 4.7 MIL/uL (ref 4.22–5.81)
RDW: 15.2 % (ref 11.5–15.5)
WBC: 5.8 10*3/uL (ref 4.0–10.5)

## 2018-06-14 LAB — URINALYSIS, ROUTINE W REFLEX MICROSCOPIC
Bacteria, UA: NONE SEEN
Bilirubin Urine: NEGATIVE
Glucose, UA: NEGATIVE mg/dL
Ketones, ur: NEGATIVE mg/dL
Leukocytes, UA: NEGATIVE
Nitrite: NEGATIVE
Protein, ur: NEGATIVE mg/dL
Specific Gravity, Urine: 1.001 — ABNORMAL LOW (ref 1.005–1.030)
pH: 7 (ref 5.0–8.0)

## 2018-06-14 MED ORDER — POTASSIUM CHLORIDE CRYS ER 20 MEQ PO TBCR
40.0000 meq | EXTENDED_RELEASE_TABLET | Freq: Once | ORAL | Status: AC
  Administered 2018-06-14: 40 meq via ORAL
  Filled 2018-06-14: qty 2

## 2018-06-14 MED ORDER — POTASSIUM CHLORIDE 10 MEQ/100ML IV SOLN
10.0000 meq | Freq: Once | INTRAVENOUS | Status: AC
  Administered 2018-06-15: 10 meq via INTRAVENOUS
  Filled 2018-06-14: qty 100

## 2018-06-14 MED ORDER — SODIUM CHLORIDE 0.9 % IV BOLUS
1000.0000 mL | Freq: Once | INTRAVENOUS | Status: AC
  Administered 2018-06-14: 1000 mL via INTRAVENOUS

## 2018-06-14 MED ORDER — MAGNESIUM SULFATE 2 GM/50ML IV SOLN
2.0000 g | INTRAVENOUS | Status: AC
  Administered 2018-06-14: 2 g via INTRAVENOUS
  Filled 2018-06-14: qty 50

## 2018-06-14 NOTE — ED Notes (Signed)
Date and time results received: 06/14/18 @23 :27   Test: potassium  Critical Value: 2.7  Name of Provider Notified: Dr Sabra Heck  Orders Received? Or Actions Taken?:no additional orders given

## 2018-06-14 NOTE — ED Notes (Signed)
Checked with pt for urine sample,pt can't go right now, family and pt aware sample needed.

## 2018-06-14 NOTE — ED Provider Notes (Signed)
[facility] EMERGENCY DEPARTMENT Provider Note   CSN: [id number] Arrival date & time: 06/14/18  2157     History   Chief Complaint Chief Complaint  Patient presents with  . Dehydration    HPI VERDIE BARROWS is a 84 y.o. male.  HPI  The patient is an 84 year old male, he has a known history of lung cancer, prior resection of his lung, avoided chemotherapy at that time but had recurrence several years later.  In February he underwent repeat surgery on his other lung to resect a local tumor and was encouraged to going to chemotherapy by his physicians at the [facility] in [place].  He went through 3 rounds of chemotherapy of the 4 that were recommended but unfortunately developed some changes in his behavior and his personality which were concerning to family.  He was found to be dehydrated, his appetite had decreased, the chemotherapy was stopped and he was admitted to the hospital for hydration and evaluation.  No other definite cause was found, he was released and has done overall adequately.  His family member reports that over the last couple of weeks since going home he has refused to take any of his medications including blood pressure or blood thinner medications and has had very little to eat or drink.  He has had some progressive generalized weakness, dizziness and over the last couple of days has had a couple of falls.  He is taking meclizine feeling like this will help his vertigo, he has been having some days where he drinks lots of water and some days where he does not drink that much.  He only had 2 glasses of water today prior to arrival.  He denies decreased urinary output, abdominal pain chest pain coughing shortness of breath headaches or blurred vision.  He does endorse having dizziness which she describes as a vertigo.  It seems to be related to movement and walking and not at rest.  Most of the history is obtained from the wife, the patient is in  agreement with what the wife is said.  There is very little other information in the medical record other than the patient's history of atrial fibrillation which is paroxysmal, symptomatic bradycardia and AV block status post pacemaker.  Past Medical History:  Diagnosis Date  . Arthritis   . Cancer ([place])    lung  . Coronary artery disease    ATHERECTOMY DONE IN [place].  [name] HTN (hypertension)   . Hyperlipidemia   . Paroxysmal atrial fibrillation ([facility])   . Symptomatic bradycardia    status post permanent transvenous pacemaker insertion by [name]    Patient Active Problem List   Diagnosis Date Noted  . Atrial fibrillation ([place]) 05/25/2015  . Pacemaker 08/24/2014  . Sinus pause 04/22/2014  . Atrioventricular block, complete ([place]) 04/22/2014  . Bradycardia 04/19/2014  . Dizziness 04/19/2014  . Coronary artery disease 10/29/2013  . Essential hypertension 10/29/2013  . Hyperlipidemia 10/29/2013  . HIP PAIN 05/11/2010  . SPINAL STENOSIS, LUMBAR 07/22/2008  . DEGENERATIVE JOINT DISEASE, KNEE 12/11/2007  . KNEE PAIN 12/11/2007    Past Surgical History:  Procedure Laterality Date  . CARDIAC CATHETERIZATION     1996    "cleaned out artery out"....no problems since  . CORONARY ANGIOPLASTY     1996  ??? in [place], [place]  . MULTIPLE TOOTH EXTRACTIONS    . PACEMAKER INSERTION  04-22-2014   STJ dual chamber pacemaker implanted for symptomatic sinus pauses  .  RENAL ARTERY DUPLEX  03/11/13   NORMAL.  [name] STRESS MYOCARDIAL PERFUSION STUDY  09/18/10   MILD TO MODERATE PERFUSION DEFECT IN THE BASAL INFERIOR AND MID INFERIOR REGION. THIS IS CONSISTANT WITH INFARCT/SCAR. NO ISCHEMIA.  . TONSILLECTOMY    . TOTAL KNEE ARTHROPLASTY  11/28/2011   Procedure: TOTAL KNEE ARTHROPLASTY;  Surgeon: [name], MD;  Location: [facility];  Service: Orthopedics;  Laterality: Left;  . TRANSTHORACIC ECHOCARDIOGRAM  03/11/13   EF 55% TO 60%. GRADE 1 DIASTOLIC DYSFUNCTION. VENTRICULAR SEPTUM: THICKNESS  IS MILDLY INCREASED. SEPTAL MOTION SHOWED ABNORMAL FUNCTION AND DYSSYNERGY. MV: MILD REGURG.         Home Medications    Prior to Admission medications   Medication Sig Start Date End Date Taking? Authorizing Provider  amLODipine (NORVASC) 2.5 MG tablet TAKE 1 TABLET DAILY 06/28/17   Baldwin Jamaica, PA-C  atorvastatin (LIPITOR) 40 MG tablet Take 20 mg by mouth daily.     [provider]  BYSTOLIC 2.5 MG tablet TAKE 1 TABLET DAILY 05/20/18   [name], MD  CAVERJECT 40 MCG SOLR Take one injection into the skin daily as needed for erection 07/20/16   [provider]  dutasteride (AVODART) 0.5 MG capsule Take 1 capsule by mouth daily. 05/10/16   [provider]  hydrochlorothiazide (HYDRODIURIL) 25 MG tablet Take 1 tablet (25 mg total) by mouth daily. 10/18/15   [name], MD  losartan (COZAAR) 100 MG tablet TAKE 1 TABLET DAILY 05/27/18   Minus Breeding, MD  NON FORMULARY Take 0.5 tablets by mouth 3 (three) times a week. turmerictcurcumin    [provider]  potassium chloride SA (KLOR-CON M20) 20 MEQ tablet Take 2 tablets (40 mEq total) by mouth daily. 05/09/15   [name], MD  QUERCETIN PO Take 1 capsule by mouth 3 (three) times a week.     [provider]  Resveratrol 250 MG CAPS Take 1 capsule by mouth daily.    [provider]  UBIQUINOL PO Take 1 capsule by mouth daily.     [provider]  XARELTO 20 MG TABS tablet TAKE 1 TABLET DAILY WITH SUPPER 05/27/18   Minus Breeding, MD    Family History Family History  Problem Relation Age of Onset  . Heart attack Mother 46  . Stroke Father 69  . Stroke Sister 66  . Heart attack Brother   . Stroke Sister   . Stroke Sister   . Heart attack Brother     Social History Social History   Tobacco Use  . Smoking status: Never Smoker  . Smokeless tobacco: Never Used  Substance Use Topics  . Alcohol use: Yes    Alcohol/week: 1.2 oz    Types: 2 Cans of beer  per week    Comment: occasionally  . Drug use: No     Allergies   Hydrocodone   Review of Systems Review of Systems  All other systems reviewed and are negative.    Physical Exam Updated Vital Signs BP (!) 145/95 (BP Location: Left Arm)   Pulse 71   Temp 98.2 F (36.8 C) (Oral)   Resp 20   Ht 5\' 11"  (1.803 m)   Wt 56.7 kg (125 lb)   SpO2 100%   BMI 17.43 kg/m   Physical Exam  Constitutional: He appears well-developed and well-nourished. No distress.  HENT:  Head: Normocephalic and atraumatic.  Mouth/Throat: Oropharynx is clear and moist. No oropharyngeal exudate.  Eyes: Pupils  are equal, round, and reactive to light. Conjunctivae and EOM are normal. Right eye exhibits no discharge. Left eye exhibits no discharge. No scleral icterus.  Neck: Normal range of motion. Neck supple. No JVD present. No thyromegaly present.  Cardiovascular: Normal rate, regular rhythm, normal heart sounds and intact distal pulses. Exam reveals no gallop and no friction rub.  No murmur heard. Pulmonary/Chest: Effort normal and breath sounds normal. No respiratory distress. He has no wheezes. He has no rales.  Abdominal: Soft. Bowel sounds are normal. He exhibits no distension and no mass. There is no tenderness.  Musculoskeletal: Normal range of motion. He exhibits no edema or tenderness.  Lymphadenopathy:    He has no cervical adenopathy.  Neurological: He is alert. Coordination normal.  Patient is able to move all 4 extremities, he has normal strength in the arms and the legs, no facial droop, speech is clear but sometimes his words seem to be redundant and circular in discussion.  Skin: Skin is warm and dry. No rash noted. No erythema.  Psychiatric: He has a normal mood and affect. His behavior is normal.  Nursing note and vitals reviewed.    ED Treatments / Results  Labs (all labs ordered are listed, but only abnormal results are displayed) Labs Reviewed  CBC WITH  DIFFERENTIAL/PLATELET  COMPREHENSIVE METABOLIC PANEL  URINALYSIS, ROUTINE W REFLEX MICROSCOPIC    EKG None  Radiology No results found.  Procedures Procedures (including critical care time)  Medications Ordered in ED Medications  sodium chloride 0.9 % bolus 1,000 mL (has no administration in time range)     Initial Impression / Assessment and Plan / ED Course  I have reviewed the triage vital signs and the nursing notes.  Pertinent labs & imaging results that were available during my care of the patient were reviewed by me and considered in my medical decision making (see chart for details).    The patient is not ill-appearing, he appears generally weak but otherwise is in no distress.  This could be related to electrolytes, dehydration, urinary infection but would also consider with a couple of falls that he may have developed an intracranial injury, bleeding or even potentially have had a stroke.  Would also consider that he may have metastatic disease to the brain, though these etiologies are less likely the cause of his symptoms is unclear and it warrants a work-up.  The patient and his wife are in agreement.  I L of NS  The patient is receiving IV fluids, he is feeling better, his potassium was noted to be low at 2.7 but the patient needs to have magnesium and potassium replacement due to this low level.  He is agreeable to the plan.  Urinalysis also reviewed and shows no signs of ketones or infection and in fact his specific gravity is low at 1.001.  I feel that the patient would be best served in his home setting with his family, I have encouraged him to use his potassium supplements as prescribed and to start taking his medication as his family doctor has asked him to.  He is agreeable as is the family.  At change of shift the patient is getting his medications including magnesium and potassium both IV and by mouth, I would like him to have this prior to discharge.  He has K  supplements at home - is agreeable to take them at home.  Family seems very supportive   Final Clinical Impressions(s) / ED Diagnoses   Final  diagnoses:  Hypokalemia  Dehydration  Generalized weakness      Noemi Chapel, MD 06/14/18 2359

## 2018-06-14 NOTE — Discharge Instructions (Addendum)
Your testing today shows that you have a low potassium level - this is likely due to not taking the potassium supplements and having decreased appetite.  There are a couple of things that I would like you to do immediately.   Take all of your medicines exactly as prescribed by your doctor INCLUDING your POTASSIUM supplements Please make sure to eat 4 small meals a day and... Drink at least 5 bottles of water a day  You may return to the emergency department for any severe or worsening symptoms including weakness, fever, vomiting, dizziness, ongoing falls, changes in vision, coughing or shortness of breath.

## 2018-06-14 NOTE — ED Triage Notes (Addendum)
Pt brought in by his wife with concerns for dehydration because he has not been drinking fluids as well. Wife says he has been off balance and has fallen twice this week. Pt took meclizine just prior to arrival.

## 2018-06-15 DIAGNOSIS — M6281 Muscle weakness (generalized): Secondary | ICD-10-CM | POA: Diagnosis not present

## 2018-07-01 ENCOUNTER — Ambulatory Visit (INDEPENDENT_AMBULATORY_CARE_PROVIDER_SITE_OTHER): Payer: Medicare Other | Admitting: *Deleted

## 2018-07-01 DIAGNOSIS — I495 Sick sinus syndrome: Secondary | ICD-10-CM

## 2018-07-01 DIAGNOSIS — I442 Atrioventricular block, complete: Secondary | ICD-10-CM | POA: Diagnosis not present

## 2018-07-01 NOTE — Progress Notes (Signed)
Remote pacemaker transmission.   

## 2018-07-08 DIAGNOSIS — Z8673 Personal history of transient ischemic attack (TIA), and cerebral infarction without residual deficits: Secondary | ICD-10-CM | POA: Diagnosis not present

## 2018-07-08 DIAGNOSIS — E785 Hyperlipidemia, unspecified: Secondary | ICD-10-CM | POA: Diagnosis not present

## 2018-07-08 DIAGNOSIS — C3431 Malignant neoplasm of lower lobe, right bronchus or lung: Secondary | ICD-10-CM | POA: Diagnosis not present

## 2018-07-08 DIAGNOSIS — I4891 Unspecified atrial fibrillation: Secondary | ICD-10-CM | POA: Diagnosis not present

## 2018-07-08 DIAGNOSIS — I10 Essential (primary) hypertension: Secondary | ICD-10-CM | POA: Diagnosis not present

## 2018-07-08 DIAGNOSIS — I2510 Atherosclerotic heart disease of native coronary artery without angina pectoris: Secondary | ICD-10-CM | POA: Diagnosis not present

## 2018-07-08 DIAGNOSIS — F0390 Unspecified dementia without behavioral disturbance: Secondary | ICD-10-CM | POA: Diagnosis not present

## 2018-07-10 DIAGNOSIS — I10 Essential (primary) hypertension: Secondary | ICD-10-CM | POA: Diagnosis not present

## 2018-07-10 DIAGNOSIS — I4891 Unspecified atrial fibrillation: Secondary | ICD-10-CM | POA: Diagnosis not present

## 2018-07-10 DIAGNOSIS — C3431 Malignant neoplasm of lower lobe, right bronchus or lung: Secondary | ICD-10-CM | POA: Diagnosis not present

## 2018-07-10 DIAGNOSIS — F0390 Unspecified dementia without behavioral disturbance: Secondary | ICD-10-CM | POA: Diagnosis not present

## 2018-07-10 DIAGNOSIS — I2510 Atherosclerotic heart disease of native coronary artery without angina pectoris: Secondary | ICD-10-CM | POA: Diagnosis not present

## 2018-07-10 DIAGNOSIS — E785 Hyperlipidemia, unspecified: Secondary | ICD-10-CM | POA: Diagnosis not present

## 2018-07-14 DIAGNOSIS — F0390 Unspecified dementia without behavioral disturbance: Secondary | ICD-10-CM | POA: Diagnosis not present

## 2018-07-14 DIAGNOSIS — I2510 Atherosclerotic heart disease of native coronary artery without angina pectoris: Secondary | ICD-10-CM | POA: Diagnosis not present

## 2018-07-14 DIAGNOSIS — I10 Essential (primary) hypertension: Secondary | ICD-10-CM | POA: Diagnosis not present

## 2018-07-14 DIAGNOSIS — C3431 Malignant neoplasm of lower lobe, right bronchus or lung: Secondary | ICD-10-CM | POA: Diagnosis not present

## 2018-07-14 DIAGNOSIS — E785 Hyperlipidemia, unspecified: Secondary | ICD-10-CM | POA: Diagnosis not present

## 2018-07-14 DIAGNOSIS — I4891 Unspecified atrial fibrillation: Secondary | ICD-10-CM | POA: Diagnosis not present

## 2018-07-15 DIAGNOSIS — I10 Essential (primary) hypertension: Secondary | ICD-10-CM | POA: Diagnosis not present

## 2018-07-15 DIAGNOSIS — I4891 Unspecified atrial fibrillation: Secondary | ICD-10-CM | POA: Diagnosis not present

## 2018-07-15 DIAGNOSIS — F0390 Unspecified dementia without behavioral disturbance: Secondary | ICD-10-CM | POA: Diagnosis not present

## 2018-07-15 DIAGNOSIS — E785 Hyperlipidemia, unspecified: Secondary | ICD-10-CM | POA: Diagnosis not present

## 2018-07-15 DIAGNOSIS — I2510 Atherosclerotic heart disease of native coronary artery without angina pectoris: Secondary | ICD-10-CM | POA: Diagnosis not present

## 2018-07-15 DIAGNOSIS — C3431 Malignant neoplasm of lower lobe, right bronchus or lung: Secondary | ICD-10-CM | POA: Diagnosis not present

## 2018-07-16 DIAGNOSIS — I10 Essential (primary) hypertension: Secondary | ICD-10-CM | POA: Diagnosis not present

## 2018-07-16 DIAGNOSIS — I2510 Atherosclerotic heart disease of native coronary artery without angina pectoris: Secondary | ICD-10-CM | POA: Diagnosis not present

## 2018-07-16 DIAGNOSIS — F0390 Unspecified dementia without behavioral disturbance: Secondary | ICD-10-CM | POA: Diagnosis not present

## 2018-07-16 DIAGNOSIS — I4891 Unspecified atrial fibrillation: Secondary | ICD-10-CM | POA: Diagnosis not present

## 2018-07-16 DIAGNOSIS — E785 Hyperlipidemia, unspecified: Secondary | ICD-10-CM | POA: Diagnosis not present

## 2018-07-16 DIAGNOSIS — C3431 Malignant neoplasm of lower lobe, right bronchus or lung: Secondary | ICD-10-CM | POA: Diagnosis not present

## 2018-07-17 DIAGNOSIS — E785 Hyperlipidemia, unspecified: Secondary | ICD-10-CM | POA: Diagnosis not present

## 2018-07-17 DIAGNOSIS — C3431 Malignant neoplasm of lower lobe, right bronchus or lung: Secondary | ICD-10-CM | POA: Diagnosis not present

## 2018-07-17 DIAGNOSIS — F0390 Unspecified dementia without behavioral disturbance: Secondary | ICD-10-CM | POA: Diagnosis not present

## 2018-07-17 DIAGNOSIS — I4891 Unspecified atrial fibrillation: Secondary | ICD-10-CM | POA: Diagnosis not present

## 2018-07-17 DIAGNOSIS — I2510 Atherosclerotic heart disease of native coronary artery without angina pectoris: Secondary | ICD-10-CM | POA: Diagnosis not present

## 2018-07-17 DIAGNOSIS — I10 Essential (primary) hypertension: Secondary | ICD-10-CM | POA: Diagnosis not present

## 2018-07-18 ENCOUNTER — Telehealth: Payer: Self-pay | Admitting: Internal Medicine

## 2018-07-18 DIAGNOSIS — R001 Bradycardia, unspecified: Secondary | ICD-10-CM | POA: Diagnosis not present

## 2018-07-18 NOTE — Telephone Encounter (Signed)
Spoke with the patients daughter that his remote pacer transmissions came back WNL and to have the homecare nurse check his HR in the future for a full minute since his pulse is irregular from the Afib. She agrees and will call if the patient has changes or is worried about his sleepiness but she said he was up and eating and feeling well.

## 2018-07-18 NOTE — Telephone Encounter (Signed)
Spoke with the home care nurse this morning. She was calling to report his BP running 138/76 and HR between 48-60. She says that she called EMS to double check his VS since he was acting much more lethargic than his normal. His daughter denies that he is having slurred speech, dizziness, headache, or weakness. Just sleeping more than usual today but did have a "long" day yesterday. BP and HR was taken manually. Pt O2SAT was 85-90.[name] Better when he was awake. Advised her to continue to monitor, he woke up and walked around the house as he normally does without difficulty. Spoke with the pacer clinic and they are going to instruct him this afternoon to send a transmission just to double check his HR and his pacemaker.

## 2018-07-18 NOTE — Telephone Encounter (Signed)
Called and asked patient's wife to send in a manual transmission. Wife verbalized understanding and plans to send now.   Will call wife with results once the transmission is received.

## 2018-07-18 NOTE — Telephone Encounter (Signed)
New message   Pt c/o BP issue: STAT if pt c/o blurred vision, one-sided weakness or slurred speech  1. What are your last 5 BP readings? 138/68 140/70 145/55 138/64 152/91 92/70  2. Are you having any other symptoms (ex. Dizziness, headache, blurred vision, passed out)? lethargic 3. What is your BP issue? bp is not regulated pt spouse is wanting to know if this could be coming from pacemaker

## 2018-07-18 NOTE — Telephone Encounter (Signed)
Remote transmission reviewed. Presenting rhythm: AF/Vs/Vp 69-112bpm. Appropriately mode switched (DDIR 70bpm). Ap 6.5%,Vp 88% since 04/02/17. No ventricular high rates. Histograms appropriate. Stable lead measurements. Normal device function. Persistent AF since 2018, asymptomatic per Dr.Taylor's note. + xarelto.

## 2018-07-21 DIAGNOSIS — E785 Hyperlipidemia, unspecified: Secondary | ICD-10-CM | POA: Diagnosis not present

## 2018-07-21 DIAGNOSIS — I2510 Atherosclerotic heart disease of native coronary artery without angina pectoris: Secondary | ICD-10-CM | POA: Diagnosis not present

## 2018-07-21 DIAGNOSIS — F0390 Unspecified dementia without behavioral disturbance: Secondary | ICD-10-CM | POA: Diagnosis not present

## 2018-07-21 DIAGNOSIS — I4891 Unspecified atrial fibrillation: Secondary | ICD-10-CM | POA: Diagnosis not present

## 2018-07-21 DIAGNOSIS — C3431 Malignant neoplasm of lower lobe, right bronchus or lung: Secondary | ICD-10-CM | POA: Diagnosis not present

## 2018-07-21 DIAGNOSIS — I10 Essential (primary) hypertension: Secondary | ICD-10-CM | POA: Diagnosis not present

## 2018-07-22 DIAGNOSIS — C3431 Malignant neoplasm of lower lobe, right bronchus or lung: Secondary | ICD-10-CM | POA: Diagnosis not present

## 2018-07-22 DIAGNOSIS — I4891 Unspecified atrial fibrillation: Secondary | ICD-10-CM | POA: Diagnosis not present

## 2018-07-22 DIAGNOSIS — I2510 Atherosclerotic heart disease of native coronary artery without angina pectoris: Secondary | ICD-10-CM | POA: Diagnosis not present

## 2018-07-22 DIAGNOSIS — F0390 Unspecified dementia without behavioral disturbance: Secondary | ICD-10-CM | POA: Diagnosis not present

## 2018-07-22 DIAGNOSIS — E785 Hyperlipidemia, unspecified: Secondary | ICD-10-CM | POA: Diagnosis not present

## 2018-07-22 DIAGNOSIS — I10 Essential (primary) hypertension: Secondary | ICD-10-CM | POA: Diagnosis not present

## 2018-07-23 DIAGNOSIS — F0390 Unspecified dementia without behavioral disturbance: Secondary | ICD-10-CM | POA: Diagnosis not present

## 2018-07-23 DIAGNOSIS — I2510 Atherosclerotic heart disease of native coronary artery without angina pectoris: Secondary | ICD-10-CM | POA: Diagnosis not present

## 2018-07-23 DIAGNOSIS — C3431 Malignant neoplasm of lower lobe, right bronchus or lung: Secondary | ICD-10-CM | POA: Diagnosis not present

## 2018-07-23 DIAGNOSIS — I10 Essential (primary) hypertension: Secondary | ICD-10-CM | POA: Diagnosis not present

## 2018-07-23 DIAGNOSIS — E785 Hyperlipidemia, unspecified: Secondary | ICD-10-CM | POA: Diagnosis not present

## 2018-07-23 DIAGNOSIS — I4891 Unspecified atrial fibrillation: Secondary | ICD-10-CM | POA: Diagnosis not present

## 2018-07-24 DIAGNOSIS — I10 Essential (primary) hypertension: Secondary | ICD-10-CM | POA: Diagnosis not present

## 2018-07-24 DIAGNOSIS — I4891 Unspecified atrial fibrillation: Secondary | ICD-10-CM | POA: Diagnosis not present

## 2018-07-24 DIAGNOSIS — F0390 Unspecified dementia without behavioral disturbance: Secondary | ICD-10-CM | POA: Diagnosis not present

## 2018-07-24 DIAGNOSIS — E785 Hyperlipidemia, unspecified: Secondary | ICD-10-CM | POA: Diagnosis not present

## 2018-07-24 DIAGNOSIS — I2510 Atherosclerotic heart disease of native coronary artery without angina pectoris: Secondary | ICD-10-CM | POA: Diagnosis not present

## 2018-07-24 DIAGNOSIS — C3431 Malignant neoplasm of lower lobe, right bronchus or lung: Secondary | ICD-10-CM | POA: Diagnosis not present

## 2018-07-26 LAB — CUP PACEART REMOTE DEVICE CHECK
Date Time Interrogation Session: 20190803131234
Implantable Lead Implant Date: 20150430
Implantable Lead Implant Date: 20150430
Implantable Lead Location: 753859
Implantable Lead Location: 753860
Implantable Pulse Generator Implant Date: 20150430
Pulse Gen Model: 2240
Pulse Gen Serial Number: 7618528

## 2018-07-28 DIAGNOSIS — I10 Essential (primary) hypertension: Secondary | ICD-10-CM | POA: Diagnosis not present

## 2018-07-28 DIAGNOSIS — I4891 Unspecified atrial fibrillation: Secondary | ICD-10-CM | POA: Diagnosis not present

## 2018-07-28 DIAGNOSIS — F0390 Unspecified dementia without behavioral disturbance: Secondary | ICD-10-CM | POA: Diagnosis not present

## 2018-07-28 DIAGNOSIS — E785 Hyperlipidemia, unspecified: Secondary | ICD-10-CM | POA: Diagnosis not present

## 2018-07-28 DIAGNOSIS — I2510 Atherosclerotic heart disease of native coronary artery without angina pectoris: Secondary | ICD-10-CM | POA: Diagnosis not present

## 2018-07-28 DIAGNOSIS — C3431 Malignant neoplasm of lower lobe, right bronchus or lung: Secondary | ICD-10-CM | POA: Diagnosis not present

## 2018-07-30 DIAGNOSIS — I2510 Atherosclerotic heart disease of native coronary artery without angina pectoris: Secondary | ICD-10-CM | POA: Diagnosis not present

## 2018-07-30 DIAGNOSIS — I10 Essential (primary) hypertension: Secondary | ICD-10-CM | POA: Diagnosis not present

## 2018-07-30 DIAGNOSIS — C3431 Malignant neoplasm of lower lobe, right bronchus or lung: Secondary | ICD-10-CM | POA: Diagnosis not present

## 2018-07-30 DIAGNOSIS — F0390 Unspecified dementia without behavioral disturbance: Secondary | ICD-10-CM | POA: Diagnosis not present

## 2018-07-30 DIAGNOSIS — I4891 Unspecified atrial fibrillation: Secondary | ICD-10-CM | POA: Diagnosis not present

## 2018-07-30 DIAGNOSIS — E785 Hyperlipidemia, unspecified: Secondary | ICD-10-CM | POA: Diagnosis not present

## 2018-07-31 ENCOUNTER — Encounter: Payer: Self-pay | Admitting: Gastroenterology

## 2018-07-31 DIAGNOSIS — F0390 Unspecified dementia without behavioral disturbance: Secondary | ICD-10-CM | POA: Diagnosis not present

## 2018-07-31 DIAGNOSIS — E785 Hyperlipidemia, unspecified: Secondary | ICD-10-CM | POA: Diagnosis not present

## 2018-07-31 DIAGNOSIS — I10 Essential (primary) hypertension: Secondary | ICD-10-CM | POA: Diagnosis not present

## 2018-07-31 DIAGNOSIS — I2510 Atherosclerotic heart disease of native coronary artery without angina pectoris: Secondary | ICD-10-CM | POA: Diagnosis not present

## 2018-07-31 DIAGNOSIS — C3431 Malignant neoplasm of lower lobe, right bronchus or lung: Secondary | ICD-10-CM | POA: Diagnosis not present

## 2018-07-31 DIAGNOSIS — I4891 Unspecified atrial fibrillation: Secondary | ICD-10-CM | POA: Diagnosis not present

## 2018-08-04 DIAGNOSIS — E785 Hyperlipidemia, unspecified: Secondary | ICD-10-CM | POA: Diagnosis not present

## 2018-08-04 DIAGNOSIS — I2510 Atherosclerotic heart disease of native coronary artery without angina pectoris: Secondary | ICD-10-CM | POA: Diagnosis not present

## 2018-08-04 DIAGNOSIS — C3431 Malignant neoplasm of lower lobe, right bronchus or lung: Secondary | ICD-10-CM | POA: Diagnosis not present

## 2018-08-04 DIAGNOSIS — I10 Essential (primary) hypertension: Secondary | ICD-10-CM | POA: Diagnosis not present

## 2018-08-04 DIAGNOSIS — I4891 Unspecified atrial fibrillation: Secondary | ICD-10-CM | POA: Diagnosis not present

## 2018-08-04 DIAGNOSIS — F0390 Unspecified dementia without behavioral disturbance: Secondary | ICD-10-CM | POA: Diagnosis not present

## 2018-08-05 DIAGNOSIS — E785 Hyperlipidemia, unspecified: Secondary | ICD-10-CM | POA: Diagnosis not present

## 2018-08-05 DIAGNOSIS — I10 Essential (primary) hypertension: Secondary | ICD-10-CM | POA: Diagnosis not present

## 2018-08-05 DIAGNOSIS — I4891 Unspecified atrial fibrillation: Secondary | ICD-10-CM | POA: Diagnosis not present

## 2018-08-05 DIAGNOSIS — C3431 Malignant neoplasm of lower lobe, right bronchus or lung: Secondary | ICD-10-CM | POA: Diagnosis not present

## 2018-08-05 DIAGNOSIS — I2510 Atherosclerotic heart disease of native coronary artery without angina pectoris: Secondary | ICD-10-CM | POA: Diagnosis not present

## 2018-08-05 DIAGNOSIS — F0390 Unspecified dementia without behavioral disturbance: Secondary | ICD-10-CM | POA: Diagnosis not present

## 2018-08-06 DIAGNOSIS — E785 Hyperlipidemia, unspecified: Secondary | ICD-10-CM | POA: Diagnosis not present

## 2018-08-06 DIAGNOSIS — F0390 Unspecified dementia without behavioral disturbance: Secondary | ICD-10-CM | POA: Diagnosis not present

## 2018-08-06 DIAGNOSIS — I10 Essential (primary) hypertension: Secondary | ICD-10-CM | POA: Diagnosis not present

## 2018-08-06 DIAGNOSIS — I2510 Atherosclerotic heart disease of native coronary artery without angina pectoris: Secondary | ICD-10-CM | POA: Diagnosis not present

## 2018-08-06 DIAGNOSIS — I4891 Unspecified atrial fibrillation: Secondary | ICD-10-CM | POA: Diagnosis not present

## 2018-08-06 DIAGNOSIS — C3431 Malignant neoplasm of lower lobe, right bronchus or lung: Secondary | ICD-10-CM | POA: Diagnosis not present

## 2018-08-08 DIAGNOSIS — E785 Hyperlipidemia, unspecified: Secondary | ICD-10-CM | POA: Diagnosis not present

## 2018-08-08 DIAGNOSIS — I4891 Unspecified atrial fibrillation: Secondary | ICD-10-CM | POA: Diagnosis not present

## 2018-08-08 DIAGNOSIS — I10 Essential (primary) hypertension: Secondary | ICD-10-CM | POA: Diagnosis not present

## 2018-08-08 DIAGNOSIS — I2510 Atherosclerotic heart disease of native coronary artery without angina pectoris: Secondary | ICD-10-CM | POA: Diagnosis not present

## 2018-08-08 DIAGNOSIS — C3431 Malignant neoplasm of lower lobe, right bronchus or lung: Secondary | ICD-10-CM | POA: Diagnosis not present

## 2018-08-08 DIAGNOSIS — F0390 Unspecified dementia without behavioral disturbance: Secondary | ICD-10-CM | POA: Diagnosis not present

## 2018-08-11 ENCOUNTER — Other Ambulatory Visit: Payer: Self-pay

## 2018-08-11 ENCOUNTER — Emergency Department (HOSPITAL_COMMUNITY)
Admission: EM | Admit: 2018-08-11 | Discharge: 2018-08-11 | Disposition: A | Discharge status: Home / Self Care | Payer: Medicare Other | Attending: Emergency Medicine | Admitting: Emergency Medicine

## 2018-08-11 ENCOUNTER — Emergency Department (HOSPITAL_COMMUNITY): Payer: Medicare Other

## 2018-08-11 ENCOUNTER — Encounter (HOSPITAL_COMMUNITY): Payer: Self-pay | Admitting: Emergency Medicine

## 2018-08-11 DIAGNOSIS — N390 Urinary tract infection, site not specified: Secondary | ICD-10-CM

## 2018-08-11 DIAGNOSIS — R319 Hematuria, unspecified: Secondary | ICD-10-CM | POA: Insufficient documentation

## 2018-08-11 DIAGNOSIS — R4689 Other symptoms and signs involving appearance and behavior: Secondary | ICD-10-CM | POA: Diagnosis not present

## 2018-08-11 DIAGNOSIS — R4020 Unspecified coma: Secondary | ICD-10-CM | POA: Diagnosis not present

## 2018-08-11 DIAGNOSIS — Z96652 Presence of left artificial knee joint: Secondary | ICD-10-CM | POA: Diagnosis not present

## 2018-08-11 DIAGNOSIS — I2510 Atherosclerotic heart disease of native coronary artery without angina pectoris: Secondary | ICD-10-CM | POA: Diagnosis not present

## 2018-08-11 DIAGNOSIS — Z955 Presence of coronary angioplasty implant and graft: Secondary | ICD-10-CM | POA: Diagnosis not present

## 2018-08-11 DIAGNOSIS — R4182 Altered mental status, unspecified: Secondary | ICD-10-CM | POA: Diagnosis not present

## 2018-08-11 DIAGNOSIS — Z85118 Personal history of other malignant neoplasm of bronchus and lung: Secondary | ICD-10-CM | POA: Diagnosis not present

## 2018-08-11 DIAGNOSIS — I10 Essential (primary) hypertension: Secondary | ICD-10-CM | POA: Insufficient documentation

## 2018-08-11 DIAGNOSIS — J90 Pleural effusion, not elsewhere classified: Secondary | ICD-10-CM | POA: Diagnosis not present

## 2018-08-11 DIAGNOSIS — Z8673 Personal history of transient ischemic attack (TIA), and cerebral infarction without residual deficits: Secondary | ICD-10-CM | POA: Insufficient documentation

## 2018-08-11 DIAGNOSIS — R404 Transient alteration of awareness: Secondary | ICD-10-CM | POA: Diagnosis not present

## 2018-08-11 DIAGNOSIS — J9811 Atelectasis: Secondary | ICD-10-CM | POA: Diagnosis not present

## 2018-08-11 LAB — COMPREHENSIVE METABOLIC PANEL
ALT: 15 U/L (ref 0–44)
AST: 19 U/L (ref 15–41)
Albumin: 3.8 g/dL (ref 3.5–5.0)
Alkaline Phosphatase: 41 U/L (ref 38–126)
Anion gap: 10 (ref 5–15)
BUN: 13 mg/dL (ref 8–23)
CO2: 32 mmol/L (ref 22–32)
Calcium: 9.2 mg/dL (ref 8.9–10.3)
Chloride: 100 mmol/L (ref 98–111)
Creatinine, Ser: 1.04 mg/dL (ref 0.61–1.24)
GFR calc Af Amer: >60 mL/min (ref >60)
GFR calc non Af Amer: >60 mL/min (ref >60)
Glucose, Bld: 99 mg/dL (ref 70–99)
Potassium: 3.1 mmol/L — ABNORMAL LOW (ref 3.5–5.1)
Sodium: 142 mmol/L (ref 135–145)
Total Bilirubin: 0.9 mg/dL (ref 0.3–1.2)
Total Protein: 6.9 g/dL (ref 6.5–8.1)

## 2018-08-11 LAB — URINALYSIS, ROUTINE W REFLEX MICROSCOPIC
Bilirubin Urine: NEGATIVE
Glucose, UA: NEGATIVE mg/dL
Ketones, ur: NEGATIVE mg/dL
Leukocytes, UA: NEGATIVE
Nitrite: NEGATIVE
Protein, ur: NEGATIVE mg/dL
RBC / HPF: >50 RBC/hpf — ABNORMAL HIGH (ref 0–5)
Specific Gravity, Urine: 1.006 (ref 1.005–1.030)
pH: 9 — ABNORMAL HIGH (ref 5.0–8.0)

## 2018-08-11 LAB — CBC
HCT: 37.9 % — ABNORMAL LOW (ref 39.0–52.0)
Hemoglobin: 12 g/dL — ABNORMAL LOW (ref 13.0–17.0)
MCH: 27.3 pg (ref 26.0–34.0)
MCHC: 31.7 g/dL (ref 30.0–36.0)
MCV: 86.3 fL (ref 78.0–100.0)
Platelets: 128 10*3/uL — ABNORMAL LOW (ref 150–400)
RBC: 4.39 MIL/uL (ref 4.22–5.81)
RDW: 14.3 % (ref 11.5–15.5)
WBC: 4 10*3/uL (ref 4.0–10.5)

## 2018-08-11 LAB — I-STAT TROPONIN, ED: Troponin i, poc: 0.01 ng/mL (ref 0.00–0.08)

## 2018-08-11 LAB — PROTIME-INR
INR: 1.14
Prothrombin Time: 14.5 seconds (ref 11.4–15.2)

## 2018-08-11 MED ORDER — CEPHALEXIN 500 MG PO CAPS: 500.0000 mg | ORAL_CAPSULE | Freq: Three times a day (TID) | ORAL | 0 refills | Status: AC

## 2018-08-11 MED ORDER — POTASSIUM CHLORIDE 20 MEQ PO PACK
40.0000 meq | PACK | Freq: Once | ORAL | Status: AC
  Administered 2018-08-11: 40 meq via ORAL
  Filled 2018-08-11: qty 2

## 2018-08-11 MED ORDER — CEPHALEXIN 500 MG PO CAPS
500.0000 mg | ORAL_CAPSULE | Freq: Once | ORAL | Status: AC
  Administered 2018-08-11: 500 mg via ORAL
  Filled 2018-08-11: qty 1

## 2018-08-11 MED ORDER — SODIUM CHLORIDE 0.9 % IV BOLUS
1000.0000 mL | Freq: Once | INTRAVENOUS | Status: AC
  Administered 2018-08-11: 1000 mL via INTRAVENOUS

## 2018-08-11 NOTE — Discharge Instructions (Addendum)
You were evaluated in the Emergency Department and after careful evaluation, we did not find any emergent condition requiring admission or further testing in the hospital.  Your symptoms today seem to be due to urinary tract infection.  Please take the antibiotic as directed and drink plenty fluids at home.  Please return to the Emergency Department if you experience any worsening of your condition.  We encourage you to follow up with a primary care provider.  Thank you for allowing [place] to be a part of your care.

## 2018-08-11 NOTE — ED Triage Notes (Signed)
Patient brought in by EMS from home for altered mental status upon awakening this morning. States he was last seen well at 2100 last night.

## 2018-08-11 NOTE — ED Provider Notes (Signed)
[facility] Emergency Department Provider Note MRN:  [id number]  Arrival date & time: 08/11/18     Chief Complaint   Altered Mental Status   History of Present Illness   Karl Howell is a 84 y.o. year-old male with a history of hemorrhagic stroke presenting to the ED with chief complaint of altered mental status.  History obtained from wife, who explains that patient went to bed acting his normal self at about 8 PM.  This is the last known normal.  Patient woke up this morning and was significantly withdrawn, not speaking, low energy, did not want to get out of bed.  No recent fevers, not complaining of any pain.  I was unable to obtain an accurate HPI, PMH, or ROS due to the patient's altered mental status.  Review of Systems  Positive for altered mental status.  Patient's Health History    Past Medical History:  Diagnosis Date  . Arthritis   . Cancer ([place])    lung  . Coronary artery disease    ATHERECTOMY DONE IN [place].  [name] HTN (hypertension)   . Hyperlipidemia   . Paroxysmal atrial fibrillation ([facility])   . Symptomatic bradycardia    status post permanent transvenous pacemaker insertion by [name]    Past Surgical History:  Procedure Laterality Date  . CARDIAC CATHETERIZATION     1996    "cleaned out artery out"....no problems since  . CORONARY ANGIOPLASTY     1996  ??? in [place], [place]  . MULTIPLE TOOTH EXTRACTIONS    . PACEMAKER INSERTION  04-22-2014   STJ dual chamber pacemaker implanted for symptomatic sinus pauses  . RENAL ARTERY DUPLEX  03/11/13   NORMAL.  [name] STRESS MYOCARDIAL PERFUSION STUDY  09/18/10   MILD TO MODERATE PERFUSION DEFECT IN THE BASAL INFERIOR AND MID INFERIOR REGION. THIS IS CONSISTANT WITH INFARCT/SCAR. NO ISCHEMIA.  . TONSILLECTOMY    . TOTAL KNEE ARTHROPLASTY  11/28/2011   Procedure: TOTAL KNEE ARTHROPLASTY;  Surgeon: [name], MD;  Location: [facility];  Service: Orthopedics;  Laterality: Left;  . TRANSTHORACIC  ECHOCARDIOGRAM  03/11/13   EF 55% TO 60%. GRADE 1 DIASTOLIC DYSFUNCTION. VENTRICULAR SEPTUM: THICKNESS IS MILDLY INCREASED. SEPTAL MOTION SHOWED ABNORMAL FUNCTION AND DYSSYNERGY. MV: MILD REGURG.     Family History  Problem Relation Age of Onset  . Heart attack Mother 43  . Stroke Father 65  . Stroke Sister 60  . Heart attack Brother   . Stroke Sister   . Stroke Sister   . Heart attack Brother     Social History   Socioeconomic History  . Marital status: Married    Spouse name: Not on file  . Number of children: Not on file  . Years of education: Not on file  . Highest education level: Not on file  Occupational History  . Not on file  Social Needs  . Financial resource strain: Not on file  . Food insecurity:    Worry: Not on file    Inability: Not on file  . Transportation needs:    Medical: Not on file    Non-medical: Not on file  Tobacco Use  . Smoking status: Never Smoker  . Smokeless tobacco: Never Used  Substance and Sexual Activity  . Alcohol use: Yes    Alcohol/week: 2.0 standard drinks    Types: 2 Cans of beer per week    Comment: occasionally  . Drug use: No  . Sexual  activity: Not on file  Lifestyle  . Physical activity:    Days per week: Not on file    Minutes per session: Not on file  . Stress: Not on file  Relationships  . Social connections:    Talks on phone: Not on file    Gets together: Not on file    Attends religious service: Not on file    Active member of club or organization: Not on file    Attends meetings of clubs or organizations: Not on file    Relationship status: Not on file  . Intimate partner violence:    Fear of current or ex partner: Not on file    Emotionally abused: Not on file    Physically abused: Not on file    Forced sexual activity: Not on file  Other Topics Concern  . Not on file  Social History Narrative  . Not on file     Physical Exam  Vital Signs and Nursing Notes reviewed Vitals:   08/11/18 1100 08/11/18  1130  BP: (!) 158/97 (!) 159/99  Pulse: 70 70  Resp: 15 (!) 23  Temp:    SpO2: 99% 100%    CONSTITUTIONAL: Chronically ill-appearing, NAD NEURO: Withdrawn, oriented to name, moving all extremities EYES:  eyes equal and reactive ENT/NECK:  no LAD, no JVD CARDIO: Regular rate, well-perfused, normal S1 and S2 PULM:  CTAB no wheezing or rhonchi GI/GU:  normal bowel sounds, non-distended, non-tender MSK/SPINE:  No gross deformities, no edema SKIN:  no rash, atraumatic PSYCH: Withdrawn speech and behavior  Diagnostic and Interventional Summary    EKG Interpretation  Date/Time:  Monday August 11 2018 07:53:38 EDT Ventricular Rate:  72 PR Interval:    QRS Duration: 150 QT Interval:  559 QTC Calculation: 718 R Axis:   -50 Text Interpretation:  Afib/flut and V-paced complexes No further analysis attempted due to paced rhythm Confirmed by Gerlene Fee [phone]) on 08/11/2018 8:00:57 AM      Labs Reviewed  CBC - Abnormal; Notable for the following components:      Result Value   Hemoglobin 12.0 (*)    HCT 37.9 (*)    Platelets 128 (*)    All other components within normal limits  COMPREHENSIVE METABOLIC PANEL - Abnormal; Notable for the following components:   Potassium 3.1 (*)    All other components within normal limits  URINALYSIS, ROUTINE W REFLEX MICROSCOPIC - Abnormal; Notable for the following components:   Color, Urine STRAW (*)    pH 9.0 (*)    Hgb urine dipstick MODERATE (*)    RBC / HPF >50 (*)    Bacteria, UA RARE (*)    All other components within normal limits  PROTIME-INR  I-STAT TROPONIN, ED    CT Head Wo Contrast  Final Result    DG Chest 2 View  Final Result      Medications  sodium chloride 0.9 % bolus 1,000 mL (0 mLs Intravenous Stopped 08/11/18 1154)  cephALEXin (KEFLEX) capsule 500 mg (500 mg Oral Given 08/11/18 1135)  potassium chloride (KLOR-CON) packet 40 mEq (40 mEq Oral Given 08/11/18 1135)     Procedures Critical Care  ED Course and  Medical Decision Making  I have reviewed the triage vital signs and the nursing notes.  Pertinent labs & imaging results that were available during my care of the patient were reviewed by me and considered in my medical decision making (see below for details). Clinical Course as of Aug 19  79  Mon Aug 11, 3165  6515 84 year old male history of recent hemorrhagic stroke presenting with altered mental status.  Considering recurrence of hemorrhagic stroke, UTI, metabolic disarray.  Patient was pointing in his chest this morning but denies pain currently, will also screen with EKG and troponin.  Labs and CT head pending.   [MB]  1117 Work-up largely unremarkable, mild hypokalemia, urinalysis suggestive of infection.  Son now at bedside explaining patient has newly diagnosed dementia, currently at his baseline.  Will provide 1 L normal saline here given family's concern of him not drinking enough water at home.  First dose Keflex here, will follow-up with PCP.After the discussed management above, the patient was determined to be safe for discharge.  The patient was in agreement with this plan and all questions regarding their care were answered.  ED return precautions were discussed and the patient will return to the ED with any significant worsening of condition.   [MB]    Clinical Course User Index [MB] Sedonia Small Barth Kirks, MD     Barth Kirks. Sedonia Small, [place] [email or web address]  Final Clinical Impressions(s) / ED Diagnoses     ICD-10-CM   1. Urinary tract infection with hematuria, site unspecified N39.0    R31.9   2. Altered behavior R46.89 DG Chest 2 View    DG Chest 2 View    ED Discharge Orders         Ordered    cephALEXin (KEFLEX) 500 MG capsule  3 times daily     08/11/18 1151             Maudie Flakes, MD 08/11/18 1245

## 2018-08-13 DIAGNOSIS — E785 Hyperlipidemia, unspecified: Secondary | ICD-10-CM | POA: Diagnosis not present

## 2018-08-13 DIAGNOSIS — F0390 Unspecified dementia without behavioral disturbance: Secondary | ICD-10-CM | POA: Diagnosis not present

## 2018-08-13 DIAGNOSIS — I4891 Unspecified atrial fibrillation: Secondary | ICD-10-CM | POA: Diagnosis not present

## 2018-08-13 DIAGNOSIS — I2510 Atherosclerotic heart disease of native coronary artery without angina pectoris: Secondary | ICD-10-CM | POA: Diagnosis not present

## 2018-08-13 DIAGNOSIS — I10 Essential (primary) hypertension: Secondary | ICD-10-CM | POA: Diagnosis not present

## 2018-08-13 DIAGNOSIS — C3431 Malignant neoplasm of lower lobe, right bronchus or lung: Secondary | ICD-10-CM | POA: Diagnosis not present

## 2018-08-14 DIAGNOSIS — I4891 Unspecified atrial fibrillation: Secondary | ICD-10-CM | POA: Diagnosis not present

## 2018-08-14 DIAGNOSIS — F0390 Unspecified dementia without behavioral disturbance: Secondary | ICD-10-CM | POA: Diagnosis not present

## 2018-08-14 DIAGNOSIS — C3431 Malignant neoplasm of lower lobe, right bronchus or lung: Secondary | ICD-10-CM | POA: Diagnosis not present

## 2018-08-14 DIAGNOSIS — I2510 Atherosclerotic heart disease of native coronary artery without angina pectoris: Secondary | ICD-10-CM | POA: Diagnosis not present

## 2018-08-14 DIAGNOSIS — I10 Essential (primary) hypertension: Secondary | ICD-10-CM | POA: Diagnosis not present

## 2018-08-14 DIAGNOSIS — E785 Hyperlipidemia, unspecified: Secondary | ICD-10-CM | POA: Diagnosis not present

## 2018-08-18 DIAGNOSIS — I4891 Unspecified atrial fibrillation: Secondary | ICD-10-CM | POA: Diagnosis not present

## 2018-08-18 DIAGNOSIS — E785 Hyperlipidemia, unspecified: Secondary | ICD-10-CM | POA: Diagnosis not present

## 2018-08-18 DIAGNOSIS — I2510 Atherosclerotic heart disease of native coronary artery without angina pectoris: Secondary | ICD-10-CM | POA: Diagnosis not present

## 2018-08-18 DIAGNOSIS — C3431 Malignant neoplasm of lower lobe, right bronchus or lung: Secondary | ICD-10-CM | POA: Diagnosis not present

## 2018-08-18 DIAGNOSIS — F0390 Unspecified dementia without behavioral disturbance: Secondary | ICD-10-CM | POA: Diagnosis not present

## 2018-08-18 DIAGNOSIS — I10 Essential (primary) hypertension: Secondary | ICD-10-CM | POA: Diagnosis not present

## 2018-08-20 DIAGNOSIS — F0390 Unspecified dementia without behavioral disturbance: Secondary | ICD-10-CM | POA: Diagnosis not present

## 2018-08-20 DIAGNOSIS — I2510 Atherosclerotic heart disease of native coronary artery without angina pectoris: Secondary | ICD-10-CM | POA: Diagnosis not present

## 2018-08-20 DIAGNOSIS — I4891 Unspecified atrial fibrillation: Secondary | ICD-10-CM | POA: Diagnosis not present

## 2018-08-20 DIAGNOSIS — E785 Hyperlipidemia, unspecified: Secondary | ICD-10-CM | POA: Diagnosis not present

## 2018-08-20 DIAGNOSIS — C3431 Malignant neoplasm of lower lobe, right bronchus or lung: Secondary | ICD-10-CM | POA: Diagnosis not present

## 2018-08-20 DIAGNOSIS — I10 Essential (primary) hypertension: Secondary | ICD-10-CM | POA: Diagnosis not present

## 2018-09-22 ENCOUNTER — Emergency Department (HOSPITAL_COMMUNITY): Payer: Medicare Other

## 2018-09-22 ENCOUNTER — Emergency Department (HOSPITAL_COMMUNITY)
Admission: EM | Admit: 2018-09-22 | Discharge: 2018-09-22 | Disposition: A | Discharge status: Home / Self Care | Payer: Medicare Other | Attending: Emergency Medicine | Admitting: Emergency Medicine

## 2018-09-22 ENCOUNTER — Encounter (HOSPITAL_COMMUNITY): Payer: Self-pay | Admitting: Emergency Medicine

## 2018-09-22 ENCOUNTER — Other Ambulatory Visit: Payer: Self-pay

## 2018-09-22 DIAGNOSIS — I10 Essential (primary) hypertension: Secondary | ICD-10-CM | POA: Insufficient documentation

## 2018-09-22 DIAGNOSIS — S4992XA Unspecified injury of left shoulder and upper arm, initial encounter: Secondary | ICD-10-CM | POA: Diagnosis not present

## 2018-09-22 DIAGNOSIS — I259 Chronic ischemic heart disease, unspecified: Secondary | ICD-10-CM | POA: Diagnosis not present

## 2018-09-22 DIAGNOSIS — M25512 Pain in left shoulder: Secondary | ICD-10-CM | POA: Diagnosis not present

## 2018-09-22 DIAGNOSIS — W1812XA Fall from or off toilet with subsequent striking against object, initial encounter: Secondary | ICD-10-CM | POA: Insufficient documentation

## 2018-09-22 DIAGNOSIS — Z79899 Other long term (current) drug therapy: Secondary | ICD-10-CM | POA: Diagnosis not present

## 2018-09-22 DIAGNOSIS — Y999 Unspecified external cause status: Secondary | ICD-10-CM | POA: Insufficient documentation

## 2018-09-22 DIAGNOSIS — Y9389 Activity, other specified: Secondary | ICD-10-CM | POA: Diagnosis not present

## 2018-09-22 DIAGNOSIS — Y92012 Bathroom of single-family (private) house as the place of occurrence of the external cause: Secondary | ICD-10-CM | POA: Insufficient documentation

## 2018-09-22 DIAGNOSIS — Z7901 Long term (current) use of anticoagulants: Secondary | ICD-10-CM | POA: Diagnosis not present

## 2018-09-22 DIAGNOSIS — W19XXXA Unspecified fall, initial encounter: Secondary | ICD-10-CM

## 2018-09-22 NOTE — ED Triage Notes (Signed)
Pt wife reports pt fell Friday getting off toilet. Hurting to left shoulder.

## 2018-09-22 NOTE — ED Provider Notes (Signed)
[facility] EMERGENCY DEPARTMENT Provider Note   CSN: [id number] Arrival date & time: 09/22/18  1230     History   Chief Complaint Chief Complaint  Patient presents with  . Fall    HPI ICHAEL Howell is a 84 y.o. male.  Level 5 caveat for mild dementia.  History obtained from patient and his wife.  He apparently fell off the toilet on Friday striking his left shoulder.  No head or neck trauma.  No other obvious injuries.  Severity of pain is mild to moderate.  Palpation and position make pain worse.     Past Medical History:  Diagnosis Date  . Arthritis   . Cancer ([place])    lung  . Coronary artery disease    ATHERECTOMY DONE IN [place].  [name] HTN (hypertension)   . Hyperlipidemia   . Paroxysmal atrial fibrillation ([facility])   . Symptomatic bradycardia    status post permanent transvenous pacemaker insertion by [name]    Patient Active Problem List   Diagnosis Date Noted  . Atrial fibrillation ([place]) 05/25/2015  . Pacemaker 08/24/2014  . Sinus pause 04/22/2014  . Atrioventricular block, complete ([place]) 04/22/2014  . Bradycardia 04/19/2014  . Dizziness 04/19/2014  . Coronary artery disease 10/29/2013  . Essential hypertension 10/29/2013  . Hyperlipidemia 10/29/2013  . HIP PAIN 05/11/2010  . SPINAL STENOSIS, LUMBAR 07/22/2008  . DEGENERATIVE JOINT DISEASE, KNEE 12/11/2007  . KNEE PAIN 12/11/2007    Past Surgical History:  Procedure Laterality Date  . CARDIAC CATHETERIZATION     1996    "cleaned out artery out"....no problems since  . CORONARY ANGIOPLASTY     1996  ??? in [place], [place]  . MULTIPLE TOOTH EXTRACTIONS    . PACEMAKER INSERTION  04-22-2014   STJ dual chamber pacemaker implanted for symptomatic sinus pauses  . RENAL ARTERY DUPLEX  03/11/13   NORMAL.  [name] STRESS MYOCARDIAL PERFUSION STUDY  09/18/10   MILD TO MODERATE PERFUSION DEFECT IN THE BASAL INFERIOR AND MID INFERIOR REGION. THIS IS CONSISTANT WITH INFARCT/SCAR. NO ISCHEMIA.  . TONSILLECTOMY     . TOTAL KNEE ARTHROPLASTY  11/28/2011   Procedure: TOTAL KNEE ARTHROPLASTY;  Surgeon: Karl Lights, MD;  Location: [place];  Service: Orthopedics;  Laterality: Left;  . TRANSTHORACIC ECHOCARDIOGRAM  03/11/13   EF 55% TO 60%. GRADE 1 DIASTOLIC DYSFUNCTION. VENTRICULAR SEPTUM: THICKNESS IS MILDLY INCREASED. SEPTAL MOTION SHOWED ABNORMAL FUNCTION AND DYSSYNERGY. MV: MILD REGURG.         Home Medications    Prior to Admission medications   Medication Sig Start Date End Date Taking? Authorizing Provider  amLODipine (NORVASC) 2.5 MG tablet TAKE 1 TABLET DAILY 06/28/17  Yes Baldwin Jamaica, PA-C  cholecalciferol (VITAMIN D) 1000 units tablet Take 1,000 Units by mouth daily.   Yes [provider]  donepezil (ARICEPT) 10 MG tablet Take 10 mg by mouth daily.   Yes [provider]  losartan (COZAAR) 100 MG tablet TAKE 1 TABLET DAILY 05/27/18  Yes Minus Breeding, MD  Melatonin 3 MG CAPS Take by mouth at bedtime.   Yes [provider]  MIRTAZAPINE PO Take by mouth at bedtime.   Yes [provider]  potassium chloride 20 MEQ/15ML (10%) SOLN Take 20 mEq by mouth daily.   Yes [provider]  RESVERATROL PO Take 1 capsule by mouth daily. 500 mg   Yes [provider]  SERTRALINE HCL PO Take by mouth daily.   Yes [provider]  SPIRONOLACTONE PO Take by mouth daily.   Yes [provider]  THIAMINE HCL PO Take by mouth daily.   Yes [provider]  UBIQUINOL PO Take 1 capsule by mouth daily.    Yes [provider]  XARELTO 20 MG TABS tablet TAKE 1 TABLET DAILY WITH SUPPER Patient taking differently: Take 20 mg by mouth daily.  05/27/18  Yes Minus Breeding, MD  BYSTOLIC 2.5 MG tablet TAKE 1 TABLET DAILY Patient not taking: TAKE 1 TABLET DAILY 05/20/18   Karl Harp, MD  hydrochlorothiazide (HYDRODIURIL) 25 MG tablet Take 1 tablet (25 mg total) by mouth daily. Patient not taking: Reported on 09/22/2018 10/18/15    Karl Harp, MD  potassium chloride SA (KLOR-CON M20) 20 MEQ tablet Take 2 tablets (40 mEq total) by mouth daily. 05/09/15   Karl Harp, MD    Family History Family History  Problem Relation Age of Onset  . Heart attack Mother 51  . Stroke Father 51  . Stroke Sister 13  . Heart attack Brother   . Stroke Sister   . Stroke Sister   . Heart attack Brother     Social History Social History   Tobacco Use  . Smoking status: Never Smoker  . Smokeless tobacco: Never Used  Substance Use Topics  . Alcohol use: Yes    Alcohol/week: 2.0 standard drinks    Types: 2 Cans of beer per week    Comment: occasionally  . Drug use: No     Allergies   Hydrocodone   Review of Systems Review of Systems  Unable to perform ROS: Dementia     Physical Exam Updated Vital Signs BP (!) 141/70 (BP Location: Right Arm)   Pulse 74   Temp 97.8 F (36.6 C) (Oral)   Resp 18   Ht 5\' 11"  (1.803 m)   Wt 58.1 kg   SpO2 100%   BMI 17.85 kg/m   Physical Exam  Constitutional: He is oriented to person, place, and time. He appears well-developed and well-nourished.  HENT:  Head: Normocephalic and atraumatic.  Eyes: Conjunctivae are normal.  Neck: Neck supple.  Cardiovascular: Normal rate and regular rhythm.  Pulmonary/Chest: Effort normal and breath sounds normal.  Abdominal: Soft. Bowel sounds are normal.  Musculoskeletal:  Left upper extremity: Most tender on the lateral aspect of the shoulder and proximal humerus.  Pain with range of motion.  Neurological: He is alert and oriented to person, place, and time.  Skin: Skin is warm and dry.  Psychiatric: He has a normal mood and affect. His behavior is normal.  Nursing note and vitals reviewed.    ED Treatments / Results  Labs (all labs ordered are listed, but only abnormal results are displayed) Labs Reviewed - No data to display  EKG None  Radiology Dg Shoulder Left  Result Date: 09/22/2018 CLINICAL DATA:  Fall.   Left shoulder pain EXAM: LEFT SHOULDER - 2+ VIEW COMPARISON:  None. FINDINGS: Negative for fracture or dislocation Moderate spurring in the [facility] joint. Cranial subluxation of the humeral head compatible rotator cuff disease. Mild spurring of the acromion. IMPRESSION: Negative for fracture or dislocation. Rotator cuff disease. Electronically Signed   By: Franchot Gallo M.D.   On: 09/22/2018 13:05    Procedures Procedures (including critical care time)  Medications Ordered in ED Medications - No data to display   Initial Impression / Assessment and Plan / ED Course  I have reviewed the triage vital signs and  the nursing notes.  Pertinent labs & imaging results that were available during my care of the patient were reviewed by me and considered in my medical decision making (see chart for details).     Patient is in no acute distress status post fall striking left shoulder.  Pain films reveal no fracture.  No other testing necessary.  Final Clinical Impressions(s) / ED Diagnoses   Final diagnoses:  Fall, initial encounter  Acute pain of left shoulder    ED Discharge Orders    None       Nat Christen, MD 09/22/18 1526

## 2018-09-22 NOTE — Discharge Instructions (Addendum)
X-ray showed no broken bones.  Ice pack for the first few days; then can alternate heat and ice.  Follow-up with your primary care doctor.

## 2018-09-23 ENCOUNTER — Encounter (HOSPITAL_COMMUNITY): Payer: Self-pay | Admitting: Emergency Medicine

## 2018-09-23 ENCOUNTER — Other Ambulatory Visit: Payer: Self-pay

## 2018-09-23 ENCOUNTER — Emergency Department (HOSPITAL_COMMUNITY)
Admission: EM | Admit: 2018-09-23 | Discharge: 2018-09-29 | Disposition: A | Discharge status: Home / Self Care | Payer: Medicare Other | Attending: Emergency Medicine | Admitting: Emergency Medicine

## 2018-09-23 DIAGNOSIS — M25512 Pain in left shoulder: Secondary | ICD-10-CM | POA: Insufficient documentation

## 2018-09-23 DIAGNOSIS — S4992XA Unspecified injury of left shoulder and upper arm, initial encounter: Secondary | ICD-10-CM | POA: Diagnosis not present

## 2018-09-23 DIAGNOSIS — Z950 Presence of cardiac pacemaker: Secondary | ICD-10-CM | POA: Insufficient documentation

## 2018-09-23 DIAGNOSIS — F22 Delusional disorders: Secondary | ICD-10-CM

## 2018-09-23 DIAGNOSIS — Z96652 Presence of left artificial knee joint: Secondary | ICD-10-CM | POA: Diagnosis not present

## 2018-09-23 DIAGNOSIS — Z008 Encounter for other general examination: Secondary | ICD-10-CM | POA: Diagnosis not present

## 2018-09-23 DIAGNOSIS — Z7901 Long term (current) use of anticoagulants: Secondary | ICD-10-CM | POA: Insufficient documentation

## 2018-09-23 DIAGNOSIS — F333 Major depressive disorder, recurrent, severe with psychotic symptoms: Secondary | ICD-10-CM | POA: Insufficient documentation

## 2018-09-23 DIAGNOSIS — Z85118 Personal history of other malignant neoplasm of bronchus and lung: Secondary | ICD-10-CM | POA: Insufficient documentation

## 2018-09-23 DIAGNOSIS — R45851 Suicidal ideations: Secondary | ICD-10-CM | POA: Diagnosis not present

## 2018-09-23 DIAGNOSIS — R4182 Altered mental status, unspecified: Secondary | ICD-10-CM | POA: Diagnosis not present

## 2018-09-23 DIAGNOSIS — F0390 Unspecified dementia without behavioral disturbance: Secondary | ICD-10-CM | POA: Insufficient documentation

## 2018-09-23 DIAGNOSIS — I10 Essential (primary) hypertension: Secondary | ICD-10-CM | POA: Diagnosis not present

## 2018-09-23 DIAGNOSIS — I2510 Atherosclerotic heart disease of native coronary artery without angina pectoris: Secondary | ICD-10-CM | POA: Insufficient documentation

## 2018-09-23 DIAGNOSIS — R410 Disorientation, unspecified: Secondary | ICD-10-CM | POA: Diagnosis present

## 2018-09-23 DIAGNOSIS — R918 Other nonspecific abnormal finding of lung field: Secondary | ICD-10-CM | POA: Diagnosis not present

## 2018-09-23 HISTORY — DX: Unspecified dementia, unspecified severity, without behavioral disturbance, psychotic disturbance, mood disturbance, and anxiety: F03.90

## 2018-09-23 LAB — URINALYSIS, ROUTINE W REFLEX MICROSCOPIC
Bilirubin Urine: NEGATIVE
Glucose, UA: NEGATIVE mg/dL
Hgb urine dipstick: NEGATIVE
Ketones, ur: NEGATIVE mg/dL
Leukocytes, UA: NEGATIVE
Nitrite: NEGATIVE
Protein, ur: NEGATIVE mg/dL
Specific Gravity, Urine: 1.011 (ref 1.005–1.030)
pH: 7 (ref 5.0–8.0)

## 2018-09-23 LAB — CBC WITH DIFFERENTIAL/PLATELET
Basophils Absolute: 0 10*3/uL (ref 0.0–0.1)
Basophils Relative: 0 %
Eosinophils Absolute: 0.1 10*3/uL (ref 0.0–0.7)
Eosinophils Relative: 1 %
HCT: 36 % — ABNORMAL LOW (ref 39.0–52.0)
Hemoglobin: 11.4 g/dL — ABNORMAL LOW (ref 13.0–17.0)
Lymphocytes Relative: 10 %
Lymphs Abs: 0.5 10*3/uL — ABNORMAL LOW (ref 0.7–4.0)
MCH: 27.3 pg (ref 26.0–34.0)
MCHC: 31.7 g/dL (ref 30.0–36.0)
MCV: 86.3 fL (ref 78.0–100.0)
Monocytes Absolute: 0.3 10*3/uL (ref 0.1–1.0)
Monocytes Relative: 6 %
Neutro Abs: 4.5 10*3/uL (ref 1.7–7.7)
Neutrophils Relative %: 83 %
Platelets: 138 10*3/uL — ABNORMAL LOW (ref 150–400)
RBC: 4.17 MIL/uL — ABNORMAL LOW (ref 4.22–5.81)
RDW: 14.5 % (ref 11.5–15.5)
WBC: 5.5 10*3/uL (ref 4.0–10.5)

## 2018-09-23 LAB — COMPREHENSIVE METABOLIC PANEL
ALT: 17 U/L (ref 0–44)
AST: 23 U/L (ref 15–41)
Albumin: 4 g/dL (ref 3.5–5.0)
Alkaline Phosphatase: 71 U/L (ref 38–126)
Anion gap: 10 (ref 5–15)
BUN: 17 mg/dL (ref 8–23)
CO2: 27 mmol/L (ref 22–32)
Calcium: 9.1 mg/dL (ref 8.9–10.3)
Chloride: 101 mmol/L (ref 98–111)
Creatinine, Ser: 1.04 mg/dL (ref 0.61–1.24)
GFR calc Af Amer: >60 mL/min (ref >60)
GFR calc non Af Amer: >60 mL/min (ref >60)
Glucose, Bld: 110 mg/dL — ABNORMAL HIGH (ref 70–99)
Potassium: 3.4 mmol/L — ABNORMAL LOW (ref 3.5–5.1)
Sodium: 138 mmol/L (ref 135–145)
Total Bilirubin: 0.6 mg/dL (ref 0.3–1.2)
Total Protein: 7.2 g/dL (ref 6.5–8.1)

## 2018-09-23 LAB — RAPID URINE DRUG SCREEN, HOSP PERFORMED
Amphetamines: NOT DETECTED
Barbiturates: NOT DETECTED
Benzodiazepines: NOT DETECTED
Cocaine: NOT DETECTED
Opiates: NOT DETECTED
Tetrahydrocannabinol: NOT DETECTED

## 2018-09-23 LAB — ETHANOL: Alcohol, Ethyl (B): <10 mg/dL (ref <10)

## 2018-09-23 MED ORDER — VITAMIN B-1 100 MG PO TABS
100.0000 mg | ORAL_TABLET | Freq: Every day | ORAL | Status: DC
  Administered 2018-09-24 – 2018-09-29 (×6): 100 mg via ORAL
  Filled 2018-09-23 (×6): qty 1

## 2018-09-23 MED ORDER — DONEPEZIL HCL 5 MG PO TABS
10.0000 mg | ORAL_TABLET | Freq: Every day | ORAL | Status: DC
  Administered 2018-09-24 – 2018-09-28 (×4): 10 mg via ORAL
  Filled 2018-09-23: qty 2
  Filled 2018-09-23: qty 1
  Filled 2018-09-23: qty 2
  Filled 2018-09-23: qty 1
  Filled 2018-09-23: qty 2
  Filled 2018-09-23: qty 1
  Filled 2018-09-23 (×2): qty 2

## 2018-09-23 MED ORDER — POTASSIUM CHLORIDE 20 MEQ/15ML (10%) PO SOLN
20.0000 meq | Freq: Every day | ORAL | Status: DC
  Administered 2018-09-24 – 2018-09-29 (×6): 20 meq via ORAL
  Filled 2018-09-23 (×6): qty 30

## 2018-09-23 MED ORDER — AMLODIPINE BESYLATE 5 MG PO TABS
2.5000 mg | ORAL_TABLET | Freq: Every day | ORAL | Status: DC
  Administered 2018-09-24 – 2018-09-29 (×6): 2.5 mg via ORAL
  Filled 2018-09-23 (×6): qty 1

## 2018-09-23 MED ORDER — UBIQUINOL 100 MG PO CAPS: ORAL_CAPSULE | Freq: Every day | ORAL | Status: DC

## 2018-09-23 MED ORDER — MELATONIN 3 MG PO CAPS
3.0000 mg | ORAL_CAPSULE | Freq: Every day | ORAL | Status: DC
  Administered 2018-09-25 – 2018-09-28 (×3): 3 mg via ORAL
  Filled 2018-09-23 (×8): qty 1

## 2018-09-23 MED ORDER — ACETAMINOPHEN 325 MG PO TABS
650.0000 mg | ORAL_TABLET | ORAL | Status: DC | PRN
  Administered 2018-09-24: 650 mg via ORAL
  Filled 2018-09-23: qty 2

## 2018-09-23 MED ORDER — ALUM & MAG HYDROXIDE-SIMETH 200-200-20 MG/5ML PO SUSP: 30.0000 mL | Freq: Four times a day (QID) | ORAL | Status: DC | PRN

## 2018-09-23 MED ORDER — MIRTAZAPINE 15 MG PO TABS
7.5000 mg | ORAL_TABLET | Freq: Every day | ORAL | Status: DC
  Administered 2018-09-24 – 2018-09-28 (×4): 7.5 mg via ORAL
  Filled 2018-09-23 (×5): qty 1

## 2018-09-23 MED ORDER — LOSARTAN POTASSIUM 25 MG PO TABS
100.0000 mg | ORAL_TABLET | Freq: Every day | ORAL | Status: DC
  Administered 2018-09-24 – 2018-09-29 (×5): 100 mg via ORAL
  Filled 2018-09-23 (×6): qty 4

## 2018-09-23 MED ORDER — ONDANSETRON HCL 4 MG PO TABS: 4.0000 mg | ORAL_TABLET | Freq: Three times a day (TID) | ORAL | Status: DC | PRN

## 2018-09-23 MED ORDER — RIVAROXABAN 20 MG PO TABS
20.0000 mg | ORAL_TABLET | Freq: Every day | ORAL | Status: DC
  Filled 2018-09-23 (×3): qty 1

## 2018-09-23 NOTE — ED Notes (Signed)
Pt states he fell in the yard and has left arm pain
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
## 2018-09-23 NOTE — ED Notes (Signed)
Pt pulled up in bed, yellow sock on. 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

## 2018-09-23 NOTE — ED Notes (Signed)
EDP at the bedside.  ?

## 2018-09-23 NOTE — ED Notes (Signed)
Patient would not participate in TTS assessment, after being prompted and after being asked several questions. Patient to be re assessed in the morning. Doctor and RN notified of disposition.
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
## 2018-09-23 NOTE — ED Notes (Signed)
Pt brief changed and linens changed.

## 2018-09-23 NOTE — ED Notes (Signed)
Lab at the bedside 
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
## 2018-09-23 NOTE — ED Notes (Signed)
Pt wouldn't cooperate with tts
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
## 2018-09-23 NOTE — ED Notes (Signed)
Madie Reno--- wife--- [phone] Cell---- [phone]

## 2018-09-24 ENCOUNTER — Emergency Department (HOSPITAL_COMMUNITY): Payer: Medicare Other

## 2018-09-24 ENCOUNTER — Other Ambulatory Visit: Payer: Self-pay

## 2018-09-24 DIAGNOSIS — F333 Major depressive disorder, recurrent, severe with psychotic symptoms: Secondary | ICD-10-CM | POA: Diagnosis not present

## 2018-09-24 DIAGNOSIS — M25512 Pain in left shoulder: Secondary | ICD-10-CM | POA: Diagnosis not present

## 2018-09-24 DIAGNOSIS — S4992XA Unspecified injury of left shoulder and upper arm, initial encounter: Secondary | ICD-10-CM | POA: Diagnosis not present

## 2018-09-24 MED ORDER — RIVAROXABAN 15 MG PO TABS
15.0000 mg | ORAL_TABLET | Freq: Every day | ORAL | Status: DC
  Filled 2018-09-24 (×2): qty 1

## 2018-09-24 MED ORDER — RIVAROXABAN 15 MG PO TABS
15.0000 mg | ORAL_TABLET | Freq: Every day | ORAL | Status: DC
  Administered 2018-09-24 – 2018-09-29 (×6): 15 mg via ORAL
  Filled 2018-09-24 (×7): qty 1

## 2018-09-24 NOTE — ED Notes (Signed)
tts in progress 

## 2018-09-24 NOTE — Progress Notes (Addendum)
Pt meets inpatient criteria per Dr. Dwyane Dee. 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 There are currently no beds available (diversion) at the [facility]. Referral information has been sent to the following hospitals for review:  [facility]   Disposition CSW will continue to assist with placement needs.   Audree Camel, LCSW, [place] Disposition CSW [facility]/TTS [phone]  Update 315pm: CSW contacted the [facility] and spoke with Mickel Baas in admissions and assessed that the hospital is not equipped for geriatric patients. CSW spoke with Durwin Nora, [place] hospital liaison for [facility] [phone]). Mrs. Garvin Fila stated that the [place] in [place] may have beds available.

## 2018-09-24 NOTE — ED Notes (Signed)
PT's wife reports pt had two falls prior to ED arrival on 09/23/18 and c/o shoulder pain. New order for shoulder xray obtained from EDP at this time and tylenol given and ice pack applied to shoulder for comfort.

## 2018-09-24 NOTE — ED Notes (Addendum)
Lunch tray placed in pt room. Pt is resting.

## 2018-09-24 NOTE — ED Provider Notes (Signed)
Blood pressure (!) 146/80, pulse 82, temperature 98 F (36.7 C), temperature source Oral, resp. rate 18, height 5\' 11"  (1.803 m), weight 58.1 kg, SpO2 97 %.  In short, [place] is a 84 y.o. male with a chief complaint of Altered Mental Status .  Refer to the original H&P for additional details.  Patient evaluated by today by TTS and recommending inpatient psychiatry treatment. Attempting placement at the [place]. Home and PRN medications ordered. Holding in the ED pending placement.   Nanda Quinton, MD    Margette Fast, MD 09/24/18 1336

## 2018-09-24 NOTE — ED Notes (Signed)
Pt given breakfast tray

## 2018-09-24 NOTE — ED Notes (Signed)
TTS in progress at this time. PT's wife present at bedside.
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
## 2018-09-24 NOTE — ED Notes (Signed)
Wife at bedside.

## 2018-09-24 NOTE — ED Notes (Signed)
Pt switched to an inpatient hospital bed for comfort.

## 2018-09-25 ENCOUNTER — Encounter (HOSPITAL_COMMUNITY): Payer: Self-pay | Admitting: Emergency Medicine

## 2018-09-25 ENCOUNTER — Emergency Department (HOSPITAL_COMMUNITY): Payer: Medicare Other

## 2018-09-25 DIAGNOSIS — R918 Other nonspecific abnormal finding of lung field: Secondary | ICD-10-CM | POA: Diagnosis not present

## 2018-09-25 DIAGNOSIS — F333 Major depressive disorder, recurrent, severe with psychotic symptoms: Secondary | ICD-10-CM | POA: Diagnosis not present

## 2018-09-25 NOTE — ED Provider Notes (Signed)
Blood pressure 139/85, pulse 70, temperature 98 F (36.7 C), temperature source Oral, resp. rate 16, height 5\' 11"  (1.803 m), weight 58.1 kg, SpO2 96 %.  In short, LARICO DIMOCK is a 84 y.o. male with a chief complaint of Altered Mental Status .  Refer to the original H&P for additional details.  Patient pending placement in [place]. They are requiring a CXR for placement despite lack of any respiratory symptoms. CXR ordered by nursing. I reviewed the results. Favor scarring over effusion. No PNA symptoms. Patient can f/u with PCP after psychiatry treatment. Patient is medically clear.   Nanda Quinton, MD    Margette Fast, MD 09/25/18 [phone]

## 2018-09-25 NOTE — ED Notes (Signed)
Report received. Care assumed. Pt laying on hospital bed in NAD with eyes closed. Family present at bedside. Sitter posted at door. Plan of care unchanged. Will continue to monitor.

## 2018-09-25 NOTE — ED Notes (Signed)
Pt was given a bed bath.

## 2018-09-25 NOTE — Progress Notes (Signed)
CSW spoke

## 2018-09-25 NOTE — BHH Counselor (Signed)
Reassessment- Pt denies SI/HI/AVH. Pt appears oriented at this time but per Pt's wife the Pt begins sundowning at1600.   TTS will continue to seek placement.  Lorenza Cambridge, [facility] Triage Specialist

## 2018-09-25 NOTE — Progress Notes (Addendum)
Karl Howell reviewing for possible admission per Melissa in Intake/Admissions at that facility.  [name]. Judi Cong, MSW, [place] Disposition Clinical Social Work [phone] (cell) [phone] (office)  Addendum: CSW spoke to Mr. [name] at the [facility] to assess bed availability.  They do not have bed availability today.

## 2018-09-25 NOTE — ED Notes (Signed)
Pt sleeping, wife in room reading

## 2018-09-25 NOTE — ED Notes (Signed)
Pt was given his meal tray. Pt ate about half of his food.

## 2018-09-25 NOTE — ED Notes (Signed)
Family at bedside. 

## 2018-09-25 NOTE — Progress Notes (Signed)
CSW received call from [place], [facility]  Gero-psych.  They had to move patients around today and she does not have a male bed available.  Patient will be reviewed again tomorrow.    CSW called and notified pt's wife, Karl Howell of same.  [name]. Judi Cong, MSW, [place] Disposition Clinical Social Work [phone] (cell) [phone] (office)

## 2018-09-25 NOTE — ED Notes (Signed)
Pt given dinner tray. Wife assisting to eat.

## 2018-09-25 NOTE — ED Notes (Signed)
Pts lunch tray remvoed from room and vitals checked. Warm blanket and water given to pt.

## 2018-09-26 DIAGNOSIS — F333 Major depressive disorder, recurrent, severe with psychotic symptoms: Secondary | ICD-10-CM | POA: Diagnosis not present

## 2018-09-26 LAB — VITAMIN B12: Vitamin B-12: 520 pg/mL (ref 180–914)

## 2018-09-26 NOTE — ED Notes (Signed)
Patient resting at this time. Patient ate most of his meal tray. Per family request patient's meal saved and placed in the refrigerator. Patient also given 2 warm blankets.

## 2018-09-26 NOTE — ED Notes (Signed)
Patient alert. Patient was asleep. Patient woke up and RN was contacted by tele sitter due to patient trying to get out of the bed. Patient given his night time medications and some water at this time. Patient disoriented.

## 2018-09-26 NOTE — ED Notes (Signed)
Patient's wife at bedside. Patient eating supper at this time. Patient and family advised that they did not need anything at this time. Patient has sitter assist if needed outside patient's room.

## 2018-09-26 NOTE — ED Notes (Signed)
Wife at bedside and asking if pt could have a vitamin b12 deficiency causing his symptoms of dizziness and mood changes. EDP notified of concerns. Order given for vitamin B12 lab draw. Pt's wife updated.

## 2018-09-26 NOTE — ED Notes (Signed)
Pt ambulated around nursing station per wife's request with 2 person assist. Pt able to get up by himself but was unsteady when walking. Pt needs assist on both sides to help guide him to walk straight and keep from falling over to the side. Pt returned to room and placed in recliner chair. Pt reports, "it feels good to walk". Pt in good spirits and cooperating well with nursing staff and wife.

## 2018-09-26 NOTE — BHH Counselor (Signed)
09/26/18 Continued Gero-Psych Placment is recommended. Re faxed updated clinicals to the following:   [facility] (no beds available today)  [facility]  [facility]

## 2018-09-26 NOTE — BH Assessment (Addendum)
[place] Assessment Progress Note      TTS attempted to re-assess patient.  He was looking at the screen of the TTS Machine with a blank stare.  He could hear this writer talking to him, but would not answer any of the questions that were asked of him and he looked a little dazed and confused.  TTS attempting to talk to his nurse, Jinny Blossom to see how patient did throughout the night, but she is tied up and will call back. Based on his presentation today, he would still most likely benefit from [facility] Inpatient

## 2018-09-27 DIAGNOSIS — F333 Major depressive disorder, recurrent, severe with psychotic symptoms: Secondary | ICD-10-CM | POA: Diagnosis not present

## 2018-09-27 MED ORDER — LORAZEPAM 2 MG/ML IJ SOLN
1.0000 mg | Freq: Once | INTRAMUSCULAR | Status: AC
  Administered 2018-09-27: 1 mg via INTRAMUSCULAR
  Filled 2018-09-27: qty 1

## 2018-09-27 NOTE — Progress Notes (Signed)
CSW followed up on status of referral at the following:  [facility] - declined [facility] - no answer [facility] who was responsible for admissions was unavailable [facility] - no answer [facility] - no answer    CSW will continue to follow-up.    Netta Neat, MSW, LCSW Clinical Social Work

## 2018-09-27 NOTE — ED Provider Notes (Signed)
Vitals:   09/26/18 1744 09/27/18 0631  BP: 125/65 (!) 148/79  Pulse: 85 70  Resp: 16 19  Temp: 98.4 F (36.9 C)   SpO2: 99% 100%   Patient reportedly being accepted for inpatient Geri psych admission.  I was asked to fill out IVC paperwork for his admission.  I reviewed the prior H&P and the behavioral health consult for information regarding the IVC.    Hayden Rasmussen, MD 09/27/18 [phone]

## 2018-09-27 NOTE — ED Notes (Signed)
Meal provided 

## 2018-09-27 NOTE — ED Notes (Signed)
Have discontinued telemonitoring

## 2018-09-27 NOTE — ED Notes (Signed)
Patient asleep at this time.

## 2018-09-27 NOTE — ED Notes (Signed)
Patient ate and went to sleep.

## 2018-09-27 NOTE — ED Notes (Signed)
Pt brief changed.  

## 2018-09-27 NOTE — ED Notes (Signed)
Patient has sitter with him at this time.

## 2018-09-27 NOTE — ED Notes (Signed)
Patient trying to get out of bed. Repositioned and reoriented.

## 2018-09-27 NOTE — ED Notes (Signed)
Patient incontinent of urine at this time. Patient given peri care. Patient is emotional at this time. Patient states that he has been talking with the [facility].

## 2018-09-27 NOTE — ED Notes (Signed)
Patient given graham crackers and water for snacks.

## 2018-09-27 NOTE — ED Notes (Signed)
Patient awake. Patient's linen changed and gown changed by NT.

## 2018-09-27 NOTE — ED Notes (Signed)
Pt attempting to get out of bed and refusing PO medications. Dr Ashok Cordia made aware- agreed to put in order for Ativan IM

## 2018-09-27 NOTE — BHH Counselor (Signed)
TTS re-assessment:  Patient seen today.  He continues to be disoriented to time and situation.  Patient states that he slept well last pm.  He started carrying on a conversation with his wife about something that really made no sense.  Patient was cooperative and pleasant this morning.  TTS will continue to seek placement for patient.

## 2018-09-27 NOTE — ED Notes (Signed)
Dr. Melina Copa doing IVC paperwork. [facility] states they are working to get pt into [facility] and need him IVC'd due to his altered mental status

## 2018-09-28 DIAGNOSIS — F333 Major depressive disorder, recurrent, severe with psychotic symptoms: Secondary | ICD-10-CM | POA: Diagnosis not present

## 2018-09-28 NOTE — BHH Counselor (Signed)
TTS writer attempted to re-assess patient. Informed by nurse patient was sleep and unable to be awaken. TTS will attempt to see patient later this date.

## 2018-09-28 NOTE — ED Notes (Signed)
Given supper tray

## 2018-09-28 NOTE — ED Notes (Addendum)
Pt wife request that pt not be given sedatives if possible.  States it takes patient a long time to recover.  Last time he had this type of medication it took him 31 hours to become awake and alert.  Added this information to allergy list and notified charge nurse.

## 2018-09-28 NOTE — ED Notes (Signed)
Attempted to feed pt breakfast.  Pt will open eyes when name is called buy will not open mouth for food.

## 2018-09-28 NOTE — ED Notes (Signed)
Given lunch tray.

## 2018-09-28 NOTE — BH Assessment (Signed)
[place] Assessment Progress Note    Patient continues to be confused and disoriented.  Patient had to be sedated last night due to his trying to get out of bed and refusing to take his medications.  Patient has been sleeping most of the day.  Patient is not showing much improvement, therefore, Social Work will continue to seek gero-psych placement.

## 2018-09-29 DIAGNOSIS — F333 Major depressive disorder, recurrent, severe with psychotic symptoms: Secondary | ICD-10-CM | POA: Diagnosis not present

## 2018-09-29 DIAGNOSIS — F0390 Unspecified dementia without behavioral disturbance: Secondary | ICD-10-CM

## 2018-09-29 NOTE — ED Provider Notes (Signed)
The patient is well-appearing at discharge, there is no signs of infection, there is no other signs of any acute abnormalities, he has been seen and screened by the psychiatry team including Dr. Dwyane Dee, the psychiatrist on-call.  Dr. Dwyane Dee specifically recommends that the patient be discharged, the spouse is on board and agreeable to the follow-up plan.   Noemi Chapel, MD 09/29/18 1153
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
## 2018-09-29 NOTE — ED Notes (Signed)
Helped pt get dressed with wife's help. Pt is calm and cooperative. Wheeled pt out to vehicle.

## 2018-09-29 NOTE — ED Notes (Signed)
Pt attempted to get up from bed, and needed to be redirected. Pt was aware his wife promised to come back at 8 o'clock in the morning, and thought it was 8 o'clock in the morning. When RN redirected Pt and reminded Pt it was 10pm, Pt stopped trying to get up from bed and RN covered Pt up to get sleep.

## 2018-09-29 NOTE — Discharge Instructions (Addendum)
You have been seen by the psychiatrist -  See your physician at the [facility] hospital for ongoing evaluation.  Emergency department for severe or worsening symptoms

## 2018-09-29 NOTE — Progress Notes (Signed)
Pt. meets criteria for inpatient treatment per XXX, NP.  Referred out to the following hospitals for the fourth time (Pt awaiting reassessment):  [facility]  Disposition CSW will continue to follow for placement.  [name]. Judi Cong, MSW, [place] Disposition Clinical Social Work [phone] (cell) [phone] (office)

## 2018-09-30 ENCOUNTER — Ambulatory Visit (INDEPENDENT_AMBULATORY_CARE_PROVIDER_SITE_OTHER): Payer: Medicare Other | Admitting: *Deleted

## 2018-09-30 DIAGNOSIS — I442 Atrioventricular block, complete: Secondary | ICD-10-CM

## 2018-10-01 NOTE — Progress Notes (Signed)
Remote pacemaker transmission.   

## 2018-10-03 ENCOUNTER — Emergency Department (HOSPITAL_COMMUNITY)
Admission: EM | Admit: 2018-10-03 | Discharge: 2018-10-03 | Disposition: A | Discharge status: Home / Self Care | Payer: Medicare Other | Attending: Emergency Medicine | Admitting: Emergency Medicine

## 2018-10-03 ENCOUNTER — Encounter (HOSPITAL_COMMUNITY): Payer: Self-pay | Admitting: Emergency Medicine

## 2018-10-03 ENCOUNTER — Emergency Department (HOSPITAL_COMMUNITY): Payer: Medicare Other

## 2018-10-03 ENCOUNTER — Other Ambulatory Visit: Payer: Self-pay

## 2018-10-03 DIAGNOSIS — Z950 Presence of cardiac pacemaker: Secondary | ICD-10-CM | POA: Insufficient documentation

## 2018-10-03 DIAGNOSIS — Z96652 Presence of left artificial knee joint: Secondary | ICD-10-CM | POA: Insufficient documentation

## 2018-10-03 DIAGNOSIS — Y929 Unspecified place or not applicable: Secondary | ICD-10-CM | POA: Diagnosis not present

## 2018-10-03 DIAGNOSIS — I2510 Atherosclerotic heart disease of native coronary artery without angina pectoris: Secondary | ICD-10-CM | POA: Insufficient documentation

## 2018-10-03 DIAGNOSIS — Y999 Unspecified external cause status: Secondary | ICD-10-CM | POA: Insufficient documentation

## 2018-10-03 DIAGNOSIS — Z79899 Other long term (current) drug therapy: Secondary | ICD-10-CM | POA: Insufficient documentation

## 2018-10-03 DIAGNOSIS — I10 Essential (primary) hypertension: Secondary | ICD-10-CM | POA: Insufficient documentation

## 2018-10-03 DIAGNOSIS — Y939 Activity, unspecified: Secondary | ICD-10-CM | POA: Diagnosis not present

## 2018-10-03 DIAGNOSIS — S0001XA Abrasion of scalp, initial encounter: Secondary | ICD-10-CM | POA: Insufficient documentation

## 2018-10-03 DIAGNOSIS — W19XXXA Unspecified fall, initial encounter: Secondary | ICD-10-CM

## 2018-10-03 DIAGNOSIS — D649 Anemia, unspecified: Secondary | ICD-10-CM | POA: Diagnosis not present

## 2018-10-03 DIAGNOSIS — S199XXA Unspecified injury of neck, initial encounter: Secondary | ICD-10-CM | POA: Diagnosis not present

## 2018-10-03 DIAGNOSIS — F0390 Unspecified dementia without behavioral disturbance: Secondary | ICD-10-CM | POA: Insufficient documentation

## 2018-10-03 DIAGNOSIS — W0110XA Fall on same level from slipping, tripping and stumbling with subsequent striking against unspecified object, initial encounter: Secondary | ICD-10-CM | POA: Diagnosis not present

## 2018-10-03 DIAGNOSIS — Z7901 Long term (current) use of anticoagulants: Secondary | ICD-10-CM | POA: Insufficient documentation

## 2018-10-03 DIAGNOSIS — S0990XA Unspecified injury of head, initial encounter: Secondary | ICD-10-CM | POA: Diagnosis not present

## 2018-10-03 DIAGNOSIS — R51 Headache: Secondary | ICD-10-CM | POA: Diagnosis not present

## 2018-10-03 LAB — COMPREHENSIVE METABOLIC PANEL
ALT: 16 U/L (ref 0–44)
AST: 22 U/L (ref 15–41)
Albumin: 3.6 g/dL (ref 3.5–5.0)
Alkaline Phosphatase: 66 U/L (ref 38–126)
Anion gap: 7 (ref 5–15)
BUN: 11 mg/dL (ref 8–23)
CO2: 25 mmol/L (ref 22–32)
Calcium: 8.5 mg/dL — ABNORMAL LOW (ref 8.9–10.3)
Chloride: 106 mmol/L (ref 98–111)
Creatinine, Ser: 0.84 mg/dL (ref 0.61–1.24)
GFR calc Af Amer: >60 mL/min (ref >60)
GFR calc non Af Amer: >60 mL/min (ref >60)
Glucose, Bld: 93 mg/dL (ref 70–99)
Potassium: 3.8 mmol/L (ref 3.5–5.1)
Sodium: 138 mmol/L (ref 135–145)
Total Bilirubin: 0.7 mg/dL (ref 0.3–1.2)
Total Protein: 6.5 g/dL (ref 6.5–8.1)

## 2018-10-03 LAB — CBC WITH DIFFERENTIAL/PLATELET
Abs Immature Granulocytes: 0.02 10*3/uL (ref 0.00–0.07)
Basophils Absolute: 0 10*3/uL (ref 0.0–0.1)
Basophils Relative: 0 %
Eosinophils Absolute: 0.1 10*3/uL (ref 0.0–0.5)
Eosinophils Relative: 2 %
HCT: 33.2 % — ABNORMAL LOW (ref 39.0–52.0)
Hemoglobin: 9.9 g/dL — ABNORMAL LOW (ref 13.0–17.0)
Immature Granulocytes: 0 %
Lymphocytes Relative: 9 %
Lymphs Abs: 0.7 10*3/uL (ref 0.7–4.0)
MCH: 27 pg (ref 26.0–34.0)
MCHC: 29.8 g/dL — ABNORMAL LOW (ref 30.0–36.0)
MCV: 90.7 fL (ref 80.0–100.0)
Monocytes Absolute: 0.4 10*3/uL (ref 0.1–1.0)
Monocytes Relative: 6 %
Neutro Abs: 6.3 10*3/uL (ref 1.7–7.7)
Neutrophils Relative %: 83 %
Platelets: 186 10*3/uL (ref 150–400)
RBC: 3.66 MIL/uL — ABNORMAL LOW (ref 4.22–5.81)
RDW: 14.4 % (ref 11.5–15.5)
WBC: 7.6 10*3/uL (ref 4.0–10.5)
nRBC: 0 % (ref 0.0–0.2)

## 2018-10-03 LAB — OCCULT BLOOD, POC DEVICE: Fecal Occult Bld: NEGATIVE

## 2018-10-03 LAB — TROPONIN I: Troponin I: <0.03 ng/mL (ref <0.03)

## 2018-10-03 MED ORDER — SODIUM CHLORIDE 0.9 % IV BOLUS
500.0000 mL | Freq: Once | INTRAVENOUS | Status: AC
  Administered 2018-10-03: 500 mL via INTRAVENOUS

## 2018-10-03 MED ORDER — PANTOPRAZOLE SODIUM 20 MG PO TBEC: 20.0000 mg | DELAYED_RELEASE_TABLET | Freq: Every day | ORAL | 0 refills | Status: DC

## 2018-10-03 NOTE — Discharge Instructions (Addendum)
Have your blood count checked again next week

## 2018-10-03 NOTE — ED Provider Notes (Signed)
[facility] EMERGENCY DEPARTMENT Provider Note   CSN: [id number] Arrival date & time: 10/03/18  1530     History   Chief Complaint Chief Complaint  Patient presents with  . Fall    HPI Karl Howell is a 84 y.o. male.  Patient states that he fell today and hit his head questionable loss of consciousness.  The history is provided by the patient. No language interpreter was used.  Fall  This is a new problem. The current episode started 12 to 24 hours ago. The problem occurs rarely. The problem has been resolved. Pertinent negatives include no chest pain, no abdominal pain and no headaches. Nothing aggravates the symptoms. Nothing relieves the symptoms. He has tried nothing for the symptoms. The treatment provided no relief.    Past Medical History:  Diagnosis Date  . Arthritis   . Cancer ([place])    lung  . Coronary artery disease    ATHERECTOMY DONE IN [place].  [name] Dementia ([place])   . HTN (hypertension)   . Hyperlipidemia   . Paroxysmal atrial fibrillation (H[facility])   . Symptomatic bradycardia    status post permanent transvenous pacemaker insertion by [name]    Patient Active Problem List   Diagnosis Date Noted  . Atrial fibrillation ([place]) 05/25/2015  . Pacemaker 08/24/2014  . Sinus pause 04/22/2014  . Atrioventricular block, complete ([place]) 04/22/2014  . Bradycardia 04/19/2014  . Dizziness 04/19/2014  . Coronary artery disease 10/29/2013  . Essential hypertension 10/29/2013  . Hyperlipidemia 10/29/2013  . HIP PAIN 05/11/2010  . SPINAL STENOSIS, LUMBAR 07/22/2008  . DEGENERATIVE JOINT DISEASE, KNEE 12/11/2007  . KNEE PAIN 12/11/2007    Past Surgical History:  Procedure Laterality Date  . CARDIAC CATHETERIZATION     1996    "cleaned out artery out"....no problems since  . CORONARY ANGIOPLASTY     1996  ??? in [place], [place]  . MULTIPLE TOOTH EXTRACTIONS    . PACEMAKER INSERTION  04-22-2014   STJ dual chamber pacemaker implanted for symptomatic sinus  pauses  . RENAL ARTERY DUPLEX  03/11/13   NORMAL.  [name] STRESS MYOCARDIAL PERFUSION STUDY  09/18/10   MILD TO MODERATE PERFUSION DEFECT IN THE BASAL INFERIOR AND MID INFERIOR REGION. THIS IS CONSISTANT WITH INFARCT/SCAR. NO ISCHEMIA.  . TONSILLECTOMY    . TOTAL KNEE ARTHROPLASTY  11/28/2011   Procedure: TOTAL KNEE ARTHROPLASTY;  Surgeon: [name], MD;  Location: [place];  Service: Orthopedics;  Laterality: Left;  . TRANSTHORACIC ECHOCARDIOGRAM  03/11/13   EF 55% TO 60%. GRADE 1 DIASTOLIC DYSFUNCTION. VENTRICULAR SEPTUM: THICKNESS IS MILDLY INCREASED. SEPTAL MOTION SHOWED ABNORMAL FUNCTION AND DYSSYNERGY. MV: MILD REGURG.         Home Medications    Prior to Admission medications   Medication Sig Start Date End Date Taking? Authorizing Provider  donepezil (ARICEPT) 10 MG tablet Take 10 mg by mouth daily.   Yes [provider]  Melatonin 3 MG CAPS Take by mouth at bedtime.   Yes [provider]  mirtazapine (REMERON) 7.5 MG tablet Take 1 tablet by mouth at bedtime.    Yes [provider]  potassium chloride 20 MEQ/15ML (10%) SOLN Take 20 mEq by mouth daily.   Yes [provider]  amLODipine (NORVASC) 2.5 MG tablet TAKE 1 TABLET DAILY Patient taking differently: Take 2.5 mg by mouth daily.  06/28/17   Baldwin Jamaica, PA-C  cholecalciferol (VITAMIN D) 1000 units tablet Take 1,000 Units by mouth daily.  [provider]  losartan (COZAAR) 100 MG tablet TAKE 1 TABLET DAILY Patient taking differently: Take 100 mg by mouth daily.  05/27/18   Minus Breeding, MD  pantoprazole (PROTONIX) 20 MG tablet Take 1 tablet (20 mg total) by mouth daily. 10/03/18   Milton Ferguson, MD  Resveratrol 250 MG CAPS Take 500 mg by mouth daily. 500 mg    [provider]  sertraline (ZOLOFT) 100 MG tablet Take 1 tablet by mouth daily.     [provider]  spironolactone (ALDACTONE) 25 MG tablet Take 12.5 mg by mouth daily. 09/20/18   [provider]  thiamine 100 MG tablet Take 1 tablet by mouth daily.     [provider]  UBIQUINOL PO Take 1 capsule by mouth daily.     [provider]  XARELTO 20 MG TABS tablet TAKE 1 TABLET DAILY WITH SUPPER Patient taking differently: Take 20 mg by mouth daily.  05/27/18   Minus Breeding, MD    Family History Family History  Problem Relation Age of Onset  . Heart attack Mother 61  . Stroke Father 25  . Stroke Sister 63  . Heart attack Brother   . Stroke Sister   . Stroke Sister   . Heart attack Brother     Social History Social History   Tobacco Use  . Smoking status: Never Smoker  . Smokeless tobacco: Never Used  Substance Use Topics  . Alcohol use: Yes    Alcohol/week: 2.0 standard drinks    Types: 2 Cans of beer per week    Comment: occasionally  . Drug use: No     Allergies   Other; Meclizine; and Hydrocodone   Review of Systems Review of Systems  Constitutional: Negative for appetite change and fatigue.  HENT: Negative for congestion, ear discharge and sinus pressure.   Eyes: Negative for discharge.  Respiratory: Negative for cough.   Cardiovascular: Negative for chest pain.  Gastrointestinal: Negative for abdominal pain and diarrhea.  Genitourinary: Negative for frequency and hematuria.  Musculoskeletal: Negative for back pain.  Skin: Negative for rash.  Neurological: Negative for seizures and headaches.  Psychiatric/Behavioral: Negative for hallucinations.     Physical Exam Updated Vital Signs BP (!) 154/101 (BP Location: Right Arm)   Pulse 70   Temp 99.8 F (37.7 C) (Temporal)   Resp 20   Ht 5\' 11"  (1.803 m)   Wt 58.6 kg   SpO2 100%   BMI 18.02 kg/m   Physical Exam  Constitutional: He is oriented to person, place, and time. He appears well-developed.  HENT:  Head: Normocephalic.  Abrasion to top of head  Eyes: Conjunctivae and EOM are normal. No scleral icterus.  Neck: Neck supple. No thyromegaly present.  Cardiovascular:  Normal rate and regular rhythm. Exam reveals no gallop and no friction rub.  No murmur heard. Pulmonary/Chest: No stridor. He has no wheezes. He has no rales. He exhibits no tenderness.  Abdominal: He exhibits no distension. There is no tenderness. There is no rebound.  Genitourinary:  Genitourinary Comments: Rectal exam shows brown stool heme-negative  Musculoskeletal: Normal range of motion. He exhibits no edema.  Lymphadenopathy:    He has no cervical adenopathy.  Neurological: He is oriented to person, place, and time. He exhibits normal muscle tone. Coordination normal.  Skin: No rash noted. No erythema.  Psychiatric: He has a normal mood and affect. His behavior is normal.     ED Treatments / Results  Labs (all labs ordered  are listed, but only abnormal results are displayed) Labs Reviewed  CBC WITH DIFFERENTIAL/PLATELET - Abnormal; Notable for the following components:      Result Value   RBC 3.66 (*)    Hemoglobin 9.9 (*)    HCT 33.2 (*)    MCHC 29.8 (*)    All other components within normal limits  COMPREHENSIVE METABOLIC PANEL - Abnormal; Notable for the following components:   Calcium 8.5 (*)    All other components within normal limits  TROPONIN I    EKG EKG Interpretation  Date/Time:  Friday October 03 2018 17:44:07 EDT Ventricular Rate:  69 PR Interval:    QRS Duration: 176 QT Interval:  449 QTC Calculation: 481 R Axis:   -82 Text Interpretation:  A-V dual-paced rhythm with some inhibition No further analysis attempted due to paced rhythm Confirmed by Milton Ferguson [id number]) on 10/03/2018 7:25:50 PM   Radiology Ct Head Wo Contrast  Result Date: 10/03/2018 CLINICAL DATA:  Head trauma.  Ataxia.  Fall today 6 hours ago. EXAM: CT HEAD WITHOUT CONTRAST TECHNIQUE: Contiguous axial images were obtained from the base of the skull through the vertex without intravenous contrast. COMPARISON:  CT head without contrast 08/11/2018 FINDINGS: Brain: Atrophy and advanced  white matter disease is stable. Remote lacunar infarcts of the thalami are stable. A remote white matter lacunar infarct involving the anterior limb of the right internal capsule is again seen. Diffuse white matter changes are otherwise stable. No acute hemorrhage or mass lesion is present. The ventricles are proportionate to the degree of atrophy. No new infarct is present. Vascular: Atherosclerotic calcifications are again seen in the cavernous internal carotid arteries bilaterally. There is no hyperdense artery. Skull: Calvarium is intact. No significant extracranial soft tissue injury is evident. Sinuses/Orbits: The visualized paranasal sinuses and mastoid air cells are clear. Visualized globes and orbits are within normal limits. IMPRESSION: 1. No acute intracranial abnormality or significant interval change. 2. Stable atrophy and diffuse white matter disease. This likely reflects the sequela of chronic microvascular ischemia. 3. Remote lacunar infarcts of the thalami bilaterally. Electronically Signed   By: San Morelle M.D.   On: 10/03/2018 16:43   Ct Cervical Spine Wo Contrast  Result Date: 10/03/2018 CLINICAL DATA:  Headache, trauma, ataxia EXAM: CT CERVICAL SPINE WITHOUT CONTRAST TECHNIQUE: Multidetector CT imaging of the cervical spine was performed without intravenous contrast. Multiplanar CT image reconstructions were also generated. COMPARISON:  None FINDINGS: Alignment: Mild retrolisthesis at C3-C4. Remaining alignment normal. Skull base and vertebrae: Skull base intact. Incomplete posterior arch C1, developmental variant. Advanced multilevel facet degenerative changes. Degenerative disc disease changes with disc space narrowing and endplate spur formation at C3-C4 through C7-T1. Minimal endplate spurring at D2-K0. Vertebral body heights maintained. No fracture or bone destruction. Soft tissues and spinal canal: Prevertebral soft tissues normal thickness. Scattered atherosclerotic  calcifications. Disc levels: AP narrowing of spinal canal at C3-C4. Small central disc protrusion at C3-C4. Additional AP narrowing of spinal canal at C4-C5 and C5-C6. Upper chest: Lung apices clear Other: N/A IMPRESSION: Multilevel degenerative disc and facet disease changes of the cervical spine. No acute cervical spine abnormalities. Central disc protrusion C3-C4. AP narrowing of spinal canal at C3-C4, to lesser degrees at C4-C5 and C5-C6. Electronically Signed   By: Lavonia Dana M.D.   On: 10/03/2018 16:46    Procedures Procedures (including critical care time)  Medications Ordered in ED Medications  sodium chloride 0.9 % bolus 500 mL (500 mLs Intravenous New Bag/Given 10/03/18 1713)  Initial Impression / Assessment and Plan / ED Course  I have reviewed the triage vital signs and the nursing notes.  Pertinent labs & imaging results that were available during my care of the patient were reviewed by me and considered in my medical decision making (see chart for details).     Patient with fall and mild anemia with heme-negative stool.  He does take Xarelto.  Patient will be placed on Protonix and will follow-up next week with his provider to have his hemoglobin rechecked  Final Clinical Impressions(s) / ED Diagnoses   Final diagnoses:  Fall, initial encounter    ED Discharge Orders         Ordered    pantoprazole (PROTONIX) 20 MG tablet  Daily     10/03/18 1937           Milton Ferguson, MD 10/03/18 1942

## 2018-10-03 NOTE — ED Triage Notes (Addendum)
Patient's spouse states patient fell this morning at 1000 hitting his head. Patient has laceration noted to forehead at triage with bandaid over it. States he is on xarelto. Denies LOC.

## 2018-10-09 ENCOUNTER — Encounter: Payer: Self-pay | Admitting: Cardiology

## 2018-10-17 DIAGNOSIS — Z902 Acquired absence of lung [part of]: Secondary | ICD-10-CM | POA: Diagnosis not present

## 2018-10-17 DIAGNOSIS — Z85118 Personal history of other malignant neoplasm of bronchus and lung: Secondary | ICD-10-CM | POA: Diagnosis not present

## 2018-10-17 DIAGNOSIS — Z8673 Personal history of transient ischemic attack (TIA), and cerebral infarction without residual deficits: Secondary | ICD-10-CM | POA: Diagnosis not present

## 2018-10-17 DIAGNOSIS — I2510 Atherosclerotic heart disease of native coronary artery without angina pectoris: Secondary | ICD-10-CM | POA: Diagnosis not present

## 2018-10-17 DIAGNOSIS — I10 Essential (primary) hypertension: Secondary | ICD-10-CM | POA: Diagnosis not present

## 2018-10-17 DIAGNOSIS — F329 Major depressive disorder, single episode, unspecified: Secondary | ICD-10-CM | POA: Diagnosis not present

## 2018-10-17 DIAGNOSIS — N400 Enlarged prostate without lower urinary tract symptoms: Secondary | ICD-10-CM | POA: Diagnosis not present

## 2018-10-17 DIAGNOSIS — Z950 Presence of cardiac pacemaker: Secondary | ICD-10-CM | POA: Diagnosis not present

## 2018-10-17 DIAGNOSIS — I480 Paroxysmal atrial fibrillation: Secondary | ICD-10-CM | POA: Diagnosis not present

## 2018-10-17 DIAGNOSIS — G309 Alzheimer's disease, unspecified: Secondary | ICD-10-CM | POA: Diagnosis not present

## 2018-10-17 DIAGNOSIS — F0280 Dementia in other diseases classified elsewhere without behavioral disturbance: Secondary | ICD-10-CM | POA: Diagnosis not present

## 2018-10-17 DIAGNOSIS — Z7901 Long term (current) use of anticoagulants: Secondary | ICD-10-CM | POA: Diagnosis not present

## 2018-10-23 DIAGNOSIS — I10 Essential (primary) hypertension: Secondary | ICD-10-CM | POA: Diagnosis not present

## 2018-10-23 DIAGNOSIS — N400 Enlarged prostate without lower urinary tract symptoms: Secondary | ICD-10-CM | POA: Diagnosis not present

## 2018-10-23 DIAGNOSIS — I2510 Atherosclerotic heart disease of native coronary artery without angina pectoris: Secondary | ICD-10-CM | POA: Diagnosis not present

## 2018-10-23 DIAGNOSIS — I480 Paroxysmal atrial fibrillation: Secondary | ICD-10-CM | POA: Diagnosis not present

## 2018-10-23 DIAGNOSIS — F0280 Dementia in other diseases classified elsewhere without behavioral disturbance: Secondary | ICD-10-CM | POA: Diagnosis not present

## 2018-10-23 DIAGNOSIS — G309 Alzheimer's disease, unspecified: Secondary | ICD-10-CM | POA: Diagnosis not present

## 2018-10-24 DIAGNOSIS — G309 Alzheimer's disease, unspecified: Secondary | ICD-10-CM | POA: Diagnosis not present

## 2018-10-24 DIAGNOSIS — I10 Essential (primary) hypertension: Secondary | ICD-10-CM | POA: Diagnosis not present

## 2018-10-24 DIAGNOSIS — N400 Enlarged prostate without lower urinary tract symptoms: Secondary | ICD-10-CM | POA: Diagnosis not present

## 2018-10-24 DIAGNOSIS — I2510 Atherosclerotic heart disease of native coronary artery without angina pectoris: Secondary | ICD-10-CM | POA: Diagnosis not present

## 2018-10-24 DIAGNOSIS — F0280 Dementia in other diseases classified elsewhere without behavioral disturbance: Secondary | ICD-10-CM | POA: Diagnosis not present

## 2018-10-24 DIAGNOSIS — I480 Paroxysmal atrial fibrillation: Secondary | ICD-10-CM | POA: Diagnosis not present

## 2018-10-29 DIAGNOSIS — F0280 Dementia in other diseases classified elsewhere without behavioral disturbance: Secondary | ICD-10-CM | POA: Diagnosis not present

## 2018-10-29 DIAGNOSIS — I2510 Atherosclerotic heart disease of native coronary artery without angina pectoris: Secondary | ICD-10-CM | POA: Diagnosis not present

## 2018-10-29 DIAGNOSIS — I480 Paroxysmal atrial fibrillation: Secondary | ICD-10-CM | POA: Diagnosis not present

## 2018-10-29 DIAGNOSIS — I10 Essential (primary) hypertension: Secondary | ICD-10-CM | POA: Diagnosis not present

## 2018-10-29 DIAGNOSIS — N400 Enlarged prostate without lower urinary tract symptoms: Secondary | ICD-10-CM | POA: Diagnosis not present

## 2018-10-29 DIAGNOSIS — G309 Alzheimer's disease, unspecified: Secondary | ICD-10-CM | POA: Diagnosis not present

## 2018-10-31 DIAGNOSIS — I10 Essential (primary) hypertension: Secondary | ICD-10-CM | POA: Diagnosis not present

## 2018-10-31 DIAGNOSIS — I480 Paroxysmal atrial fibrillation: Secondary | ICD-10-CM | POA: Diagnosis not present

## 2018-10-31 DIAGNOSIS — G309 Alzheimer's disease, unspecified: Secondary | ICD-10-CM | POA: Diagnosis not present

## 2018-10-31 DIAGNOSIS — I2510 Atherosclerotic heart disease of native coronary artery without angina pectoris: Secondary | ICD-10-CM | POA: Diagnosis not present

## 2018-10-31 DIAGNOSIS — F0280 Dementia in other diseases classified elsewhere without behavioral disturbance: Secondary | ICD-10-CM | POA: Diagnosis not present

## 2018-10-31 DIAGNOSIS — N400 Enlarged prostate without lower urinary tract symptoms: Secondary | ICD-10-CM | POA: Diagnosis not present

## 2018-11-05 DIAGNOSIS — F0280 Dementia in other diseases classified elsewhere without behavioral disturbance: Secondary | ICD-10-CM | POA: Diagnosis not present

## 2018-11-05 DIAGNOSIS — N400 Enlarged prostate without lower urinary tract symptoms: Secondary | ICD-10-CM | POA: Diagnosis not present

## 2018-11-05 DIAGNOSIS — I480 Paroxysmal atrial fibrillation: Secondary | ICD-10-CM | POA: Diagnosis not present

## 2018-11-05 DIAGNOSIS — G309 Alzheimer's disease, unspecified: Secondary | ICD-10-CM | POA: Diagnosis not present

## 2018-11-05 DIAGNOSIS — I2510 Atherosclerotic heart disease of native coronary artery without angina pectoris: Secondary | ICD-10-CM | POA: Diagnosis not present

## 2018-11-05 DIAGNOSIS — I10 Essential (primary) hypertension: Secondary | ICD-10-CM | POA: Diagnosis not present

## 2018-11-07 DIAGNOSIS — I10 Essential (primary) hypertension: Secondary | ICD-10-CM | POA: Diagnosis not present

## 2018-11-07 DIAGNOSIS — F0280 Dementia in other diseases classified elsewhere without behavioral disturbance: Secondary | ICD-10-CM | POA: Diagnosis not present

## 2018-11-07 DIAGNOSIS — I480 Paroxysmal atrial fibrillation: Secondary | ICD-10-CM | POA: Diagnosis not present

## 2018-11-07 DIAGNOSIS — N400 Enlarged prostate without lower urinary tract symptoms: Secondary | ICD-10-CM | POA: Diagnosis not present

## 2018-11-07 DIAGNOSIS — G309 Alzheimer's disease, unspecified: Secondary | ICD-10-CM | POA: Diagnosis not present

## 2018-11-07 DIAGNOSIS — I2510 Atherosclerotic heart disease of native coronary artery without angina pectoris: Secondary | ICD-10-CM | POA: Diagnosis not present

## 2018-11-11 DIAGNOSIS — F0280 Dementia in other diseases classified elsewhere without behavioral disturbance: Secondary | ICD-10-CM | POA: Diagnosis not present

## 2018-11-11 DIAGNOSIS — G309 Alzheimer's disease, unspecified: Secondary | ICD-10-CM | POA: Diagnosis not present

## 2018-11-11 DIAGNOSIS — N400 Enlarged prostate without lower urinary tract symptoms: Secondary | ICD-10-CM | POA: Diagnosis not present

## 2018-11-11 DIAGNOSIS — I10 Essential (primary) hypertension: Secondary | ICD-10-CM | POA: Diagnosis not present

## 2018-11-11 DIAGNOSIS — I480 Paroxysmal atrial fibrillation: Secondary | ICD-10-CM | POA: Diagnosis not present

## 2018-11-11 DIAGNOSIS — I2510 Atherosclerotic heart disease of native coronary artery without angina pectoris: Secondary | ICD-10-CM | POA: Diagnosis not present

## 2018-11-13 DIAGNOSIS — I2510 Atherosclerotic heart disease of native coronary artery without angina pectoris: Secondary | ICD-10-CM | POA: Diagnosis not present

## 2018-11-13 DIAGNOSIS — F0280 Dementia in other diseases classified elsewhere without behavioral disturbance: Secondary | ICD-10-CM | POA: Diagnosis not present

## 2018-11-13 DIAGNOSIS — I480 Paroxysmal atrial fibrillation: Secondary | ICD-10-CM | POA: Diagnosis not present

## 2018-11-13 DIAGNOSIS — G309 Alzheimer's disease, unspecified: Secondary | ICD-10-CM | POA: Diagnosis not present

## 2018-11-13 DIAGNOSIS — N400 Enlarged prostate without lower urinary tract symptoms: Secondary | ICD-10-CM | POA: Diagnosis not present

## 2018-11-13 DIAGNOSIS — I10 Essential (primary) hypertension: Secondary | ICD-10-CM | POA: Diagnosis not present

## 2018-11-14 LAB — CUP PACEART REMOTE DEVICE CHECK
Battery Remaining Longevity: 114 mo
Battery Remaining Percentage: 95.5 %
Battery Voltage: 2.98 V
Brady Statistic AP VP Percent: 43 %
Brady Statistic AP VS Percent: 1.8 %
Brady Statistic AS VP Percent: 49 %
Brady Statistic AS VS Percent: 2 %
Brady Statistic RA Percent Paced: 8 %
Brady Statistic RV Percent Paced: 87 %
Date Time Interrogation Session: 20191007234006
Implantable Lead Implant Date: 20150430
Implantable Lead Implant Date: 20150430
Implantable Lead Location: 753859
Implantable Lead Location: 753860
Implantable Pulse Generator Implant Date: 20150430
Lead Channel Impedance Value: 340 Ohm
Lead Channel Impedance Value: 550 Ohm
Lead Channel Pacing Threshold Amplitude: 0.5 V
Lead Channel Pacing Threshold Amplitude: 1 V
Lead Channel Pacing Threshold Pulse Width: 0.4 ms
Lead Channel Pacing Threshold Pulse Width: 0.4 ms
Lead Channel Sensing Intrinsic Amplitude: 0.5 mV
Lead Channel Sensing Intrinsic Amplitude: 12 mV
Lead Channel Setting Pacing Amplitude: 2 V
Lead Channel Setting Pacing Amplitude: 2.5 V
Lead Channel Setting Pacing Pulse Width: 0.4 ms
Lead Channel Setting Sensing Sensitivity: 2 mV
Pulse Gen Model: 2240
Pulse Gen Serial Number: 7618528

## 2018-11-17 DIAGNOSIS — I10 Essential (primary) hypertension: Secondary | ICD-10-CM | POA: Diagnosis not present

## 2018-11-17 DIAGNOSIS — G309 Alzheimer's disease, unspecified: Secondary | ICD-10-CM | POA: Diagnosis not present

## 2018-11-17 DIAGNOSIS — F0280 Dementia in other diseases classified elsewhere without behavioral disturbance: Secondary | ICD-10-CM | POA: Diagnosis not present

## 2018-11-17 DIAGNOSIS — I2510 Atherosclerotic heart disease of native coronary artery without angina pectoris: Secondary | ICD-10-CM | POA: Diagnosis not present

## 2018-11-17 DIAGNOSIS — N400 Enlarged prostate without lower urinary tract symptoms: Secondary | ICD-10-CM | POA: Diagnosis not present

## 2018-11-17 DIAGNOSIS — I480 Paroxysmal atrial fibrillation: Secondary | ICD-10-CM | POA: Diagnosis not present

## 2018-11-19 DIAGNOSIS — N400 Enlarged prostate without lower urinary tract symptoms: Secondary | ICD-10-CM | POA: Diagnosis not present

## 2018-11-19 DIAGNOSIS — I10 Essential (primary) hypertension: Secondary | ICD-10-CM | POA: Diagnosis not present

## 2018-11-19 DIAGNOSIS — I2510 Atherosclerotic heart disease of native coronary artery without angina pectoris: Secondary | ICD-10-CM | POA: Diagnosis not present

## 2018-11-19 DIAGNOSIS — F0280 Dementia in other diseases classified elsewhere without behavioral disturbance: Secondary | ICD-10-CM | POA: Diagnosis not present

## 2018-11-19 DIAGNOSIS — G309 Alzheimer's disease, unspecified: Secondary | ICD-10-CM | POA: Diagnosis not present

## 2018-11-19 DIAGNOSIS — I480 Paroxysmal atrial fibrillation: Secondary | ICD-10-CM | POA: Diagnosis not present

## 2018-11-20 IMAGING — DX DG SHOULDER 2+V*L*
2 series · 2 of 2 positions shown · non-contrast
Comparison: None.

CLINICAL DATA: Fall.  Left shoulder pain

EXAM:
LEFT SHOULDER - 2+ VIEW

[shoulder grashey]
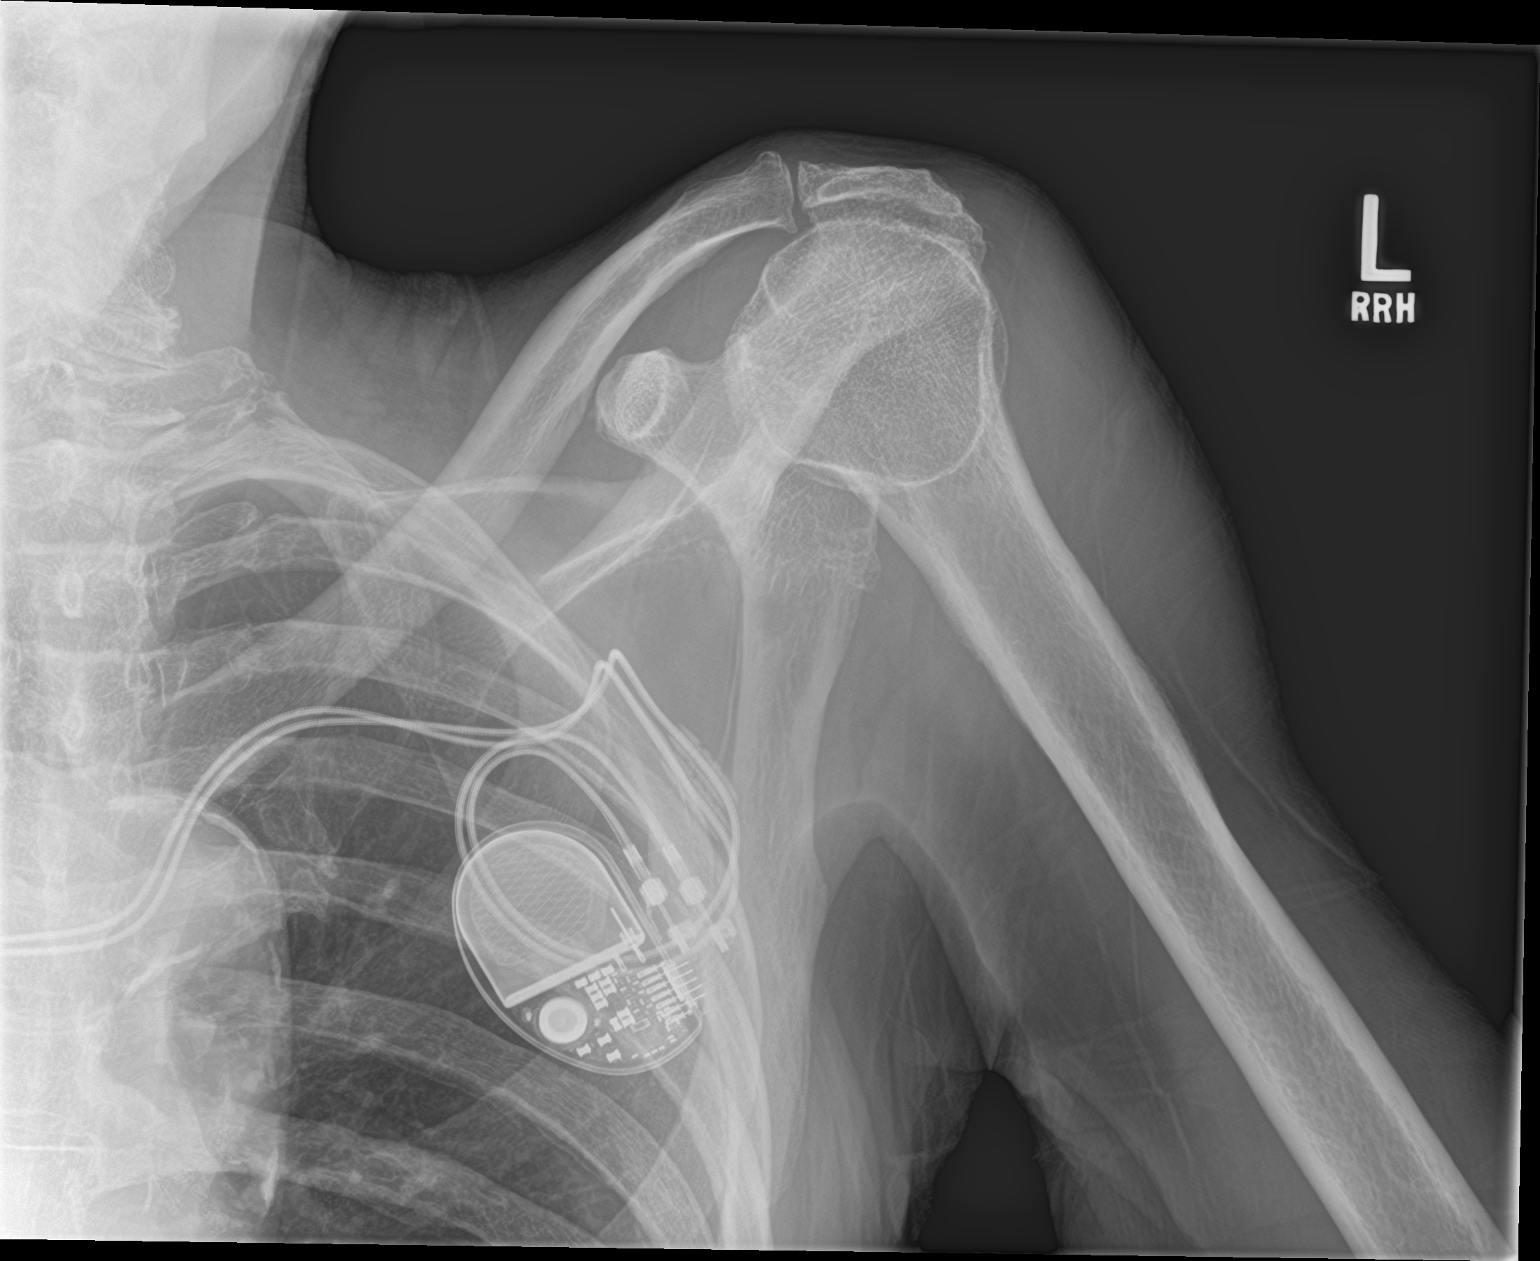

[shoulder y view]
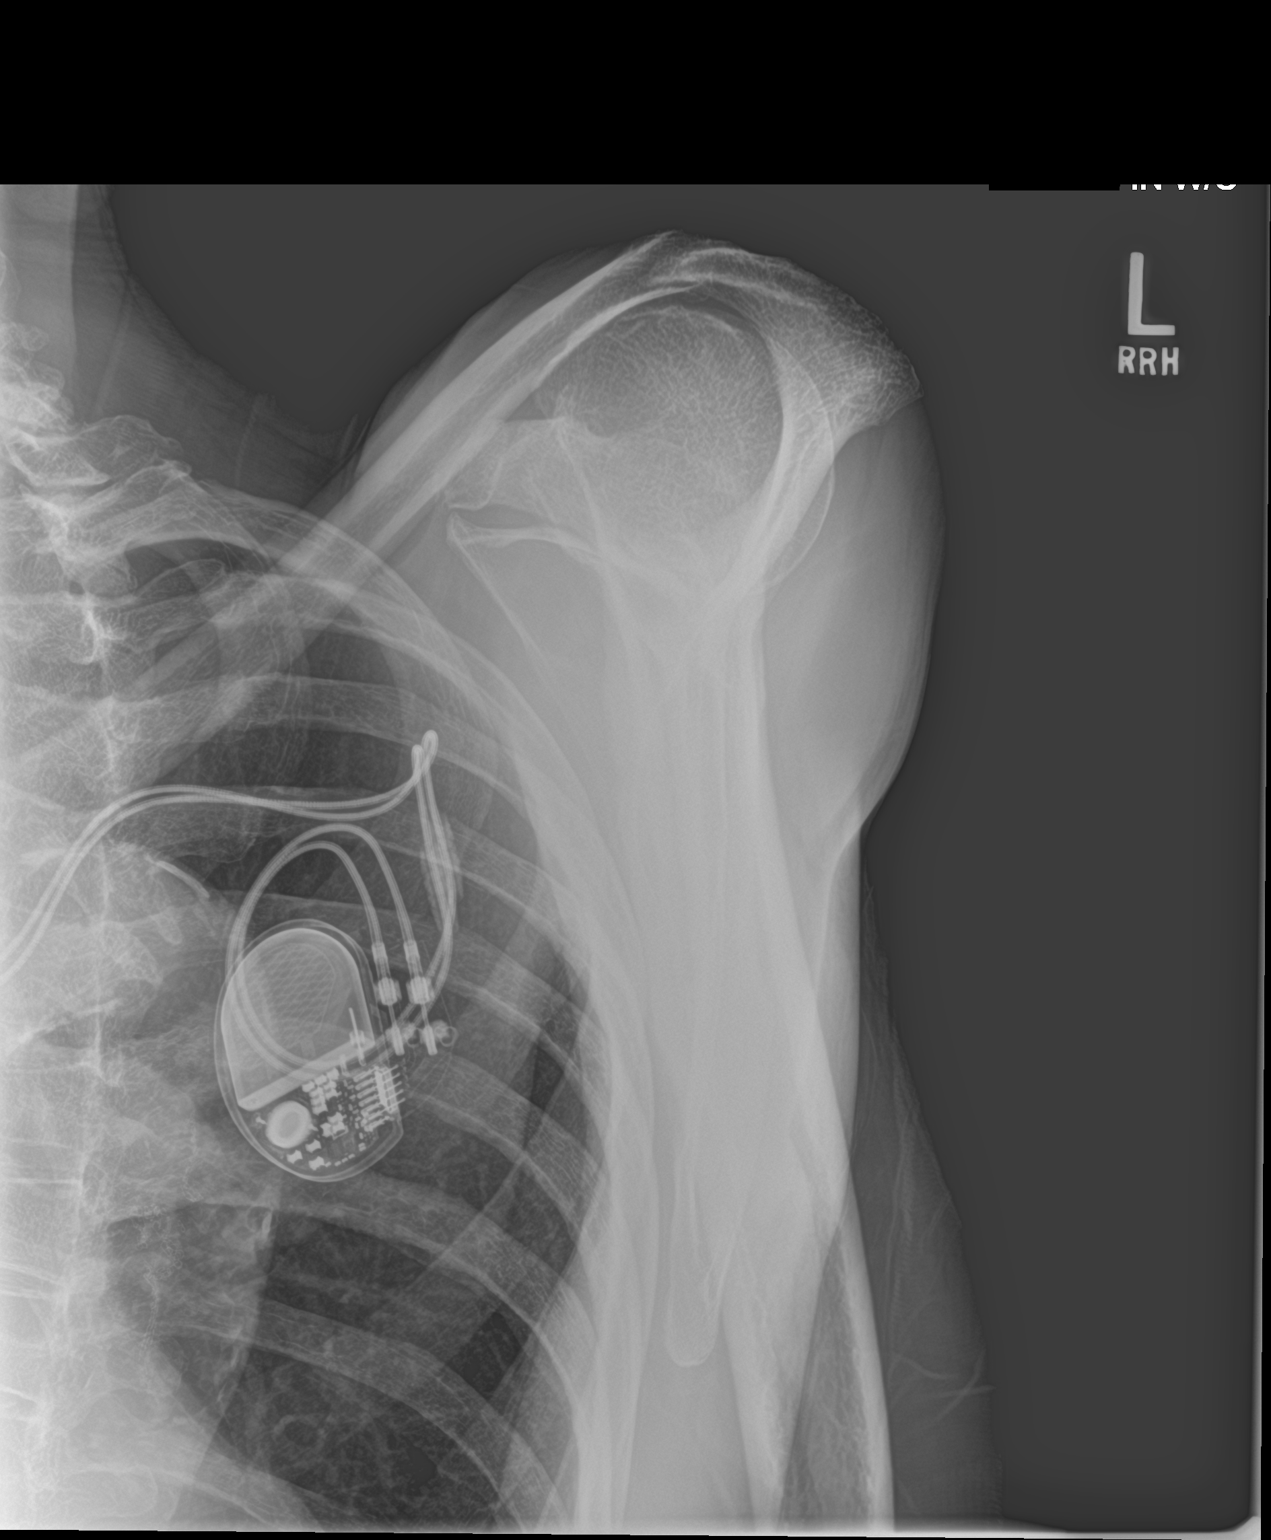

[2 of 2 positions shown; findings below may reference images not displayed]

FINDINGS: Negative for fracture or dislocation

Moderate spurring in the AC joint. Cranial subluxation of the
humeral head compatible rotator cuff disease. Mild spurring of the
acromion.
IMPRESSION: Negative for fracture or dislocation.

Rotator cuff disease.

## 2018-11-22 IMAGING — CR DG SHOULDER 2+V*L*
1 series · 2 of 2 positions shown · non-contrast
Comparison: 09/22/2018

CLINICAL DATA: LEFT shoulder pain, has fallen twice since
09/22/2018

EXAM:
LEFT SHOULDER - 2+ VIEW

[Series 1: ap · 0.17mm/px · 2 of 2 slices shown]
[im 1/2]
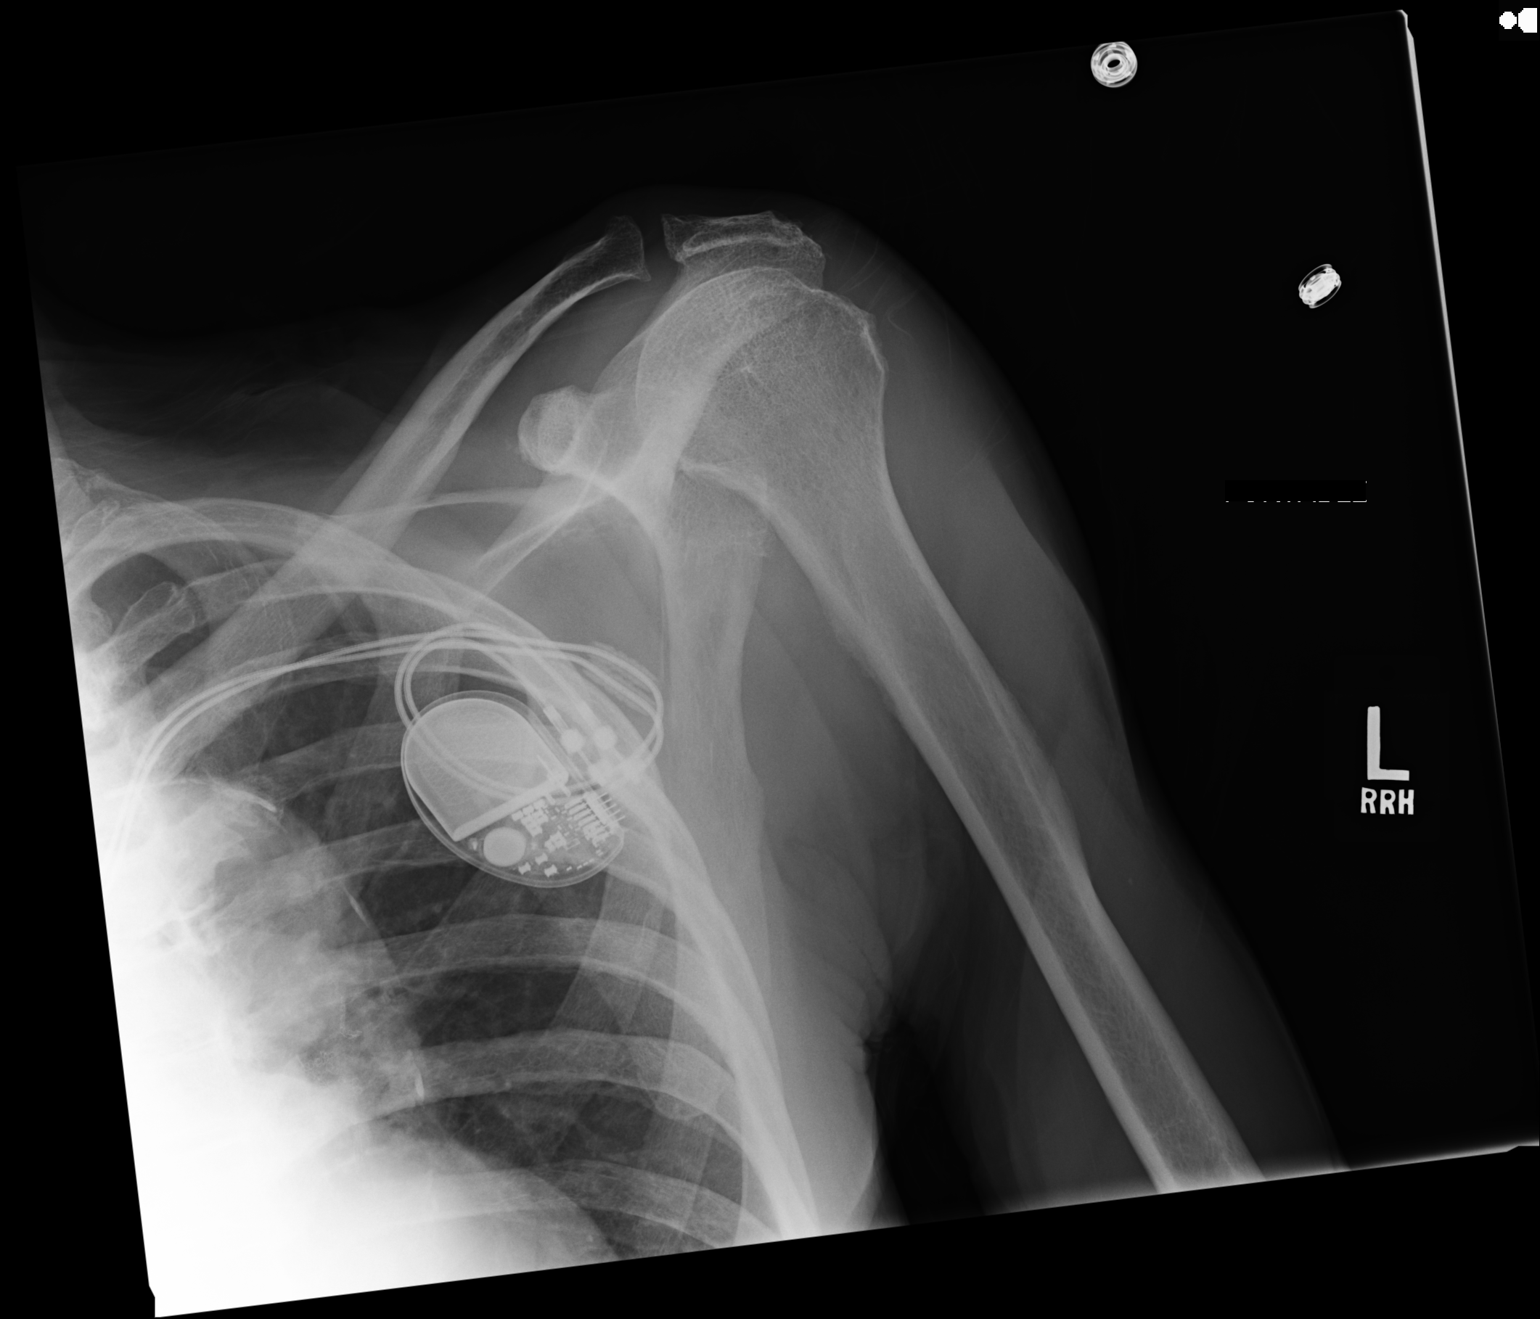
[im 2/2]
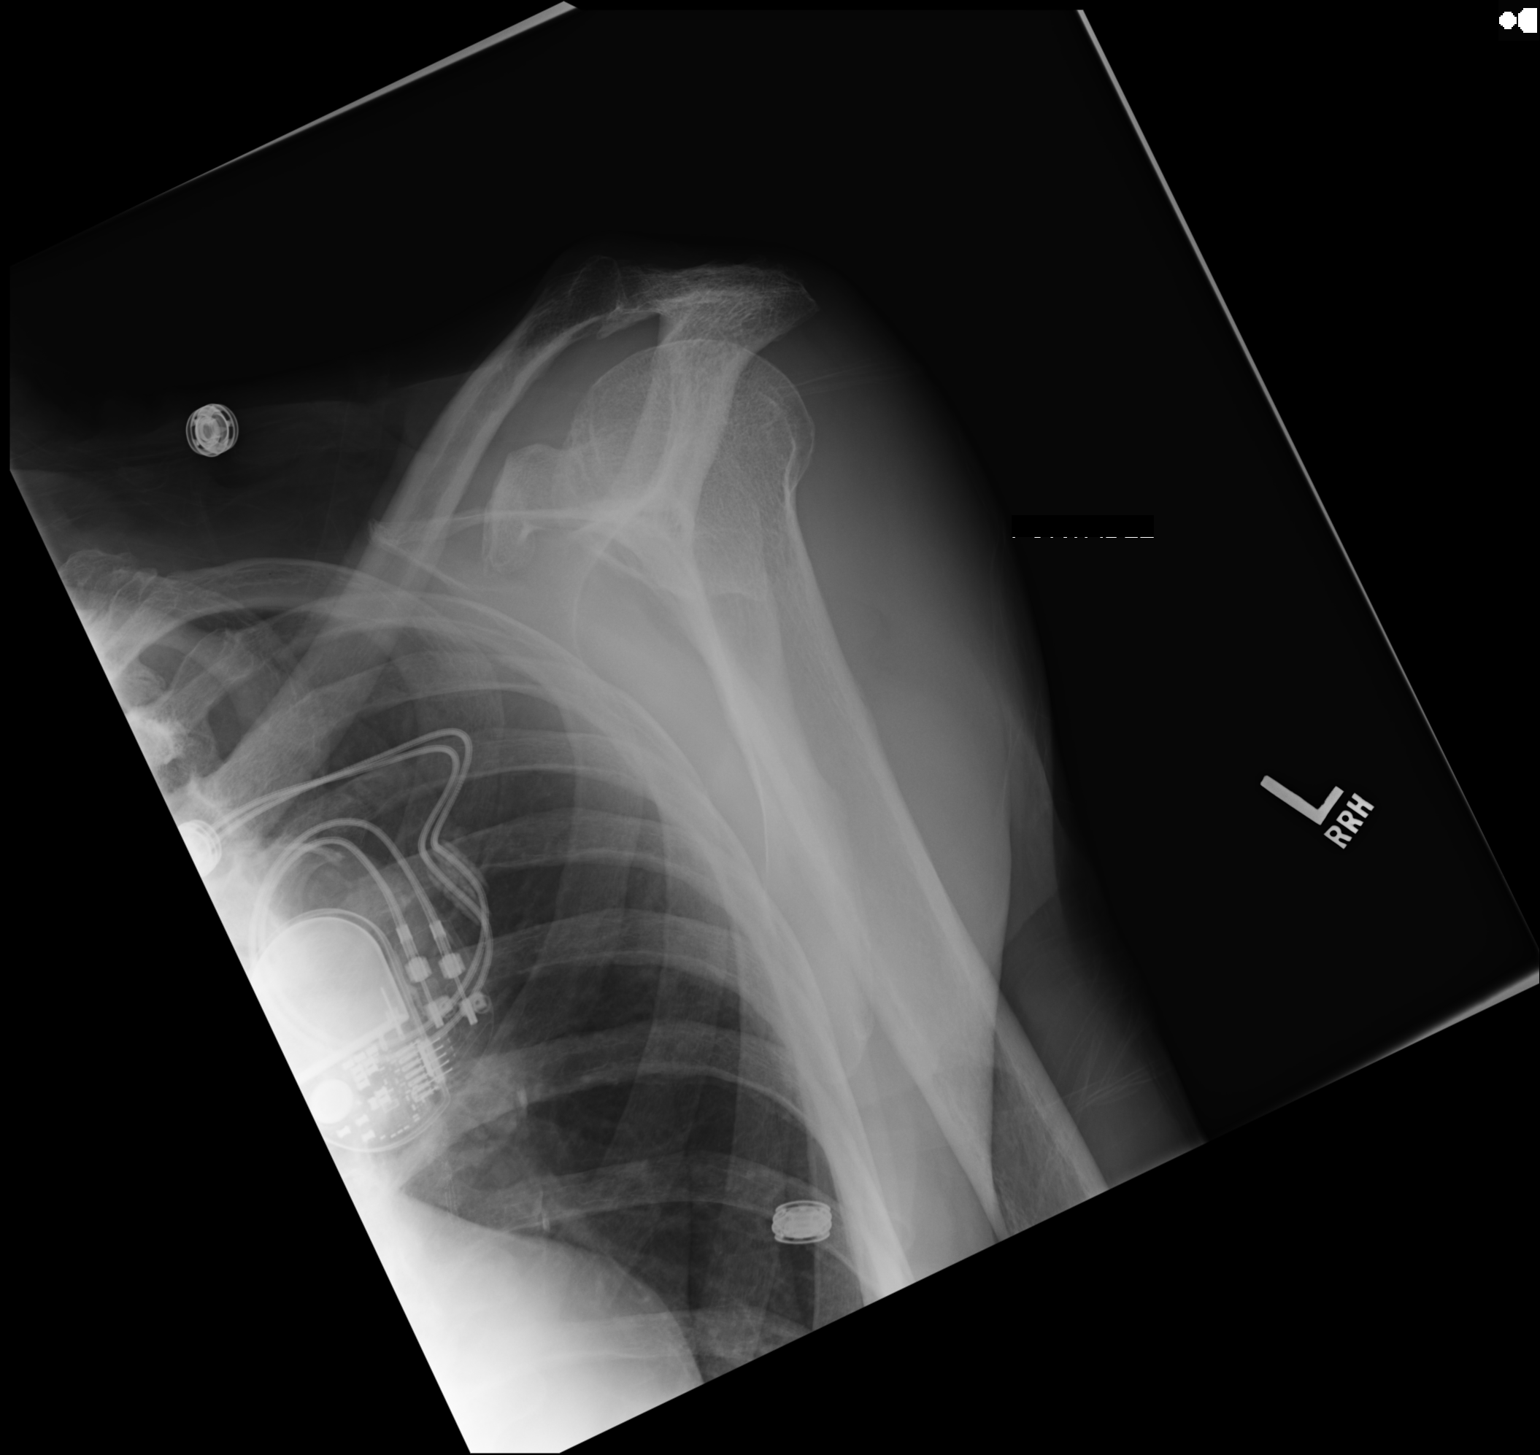

[2 of 2 positions shown; findings below may reference images not displayed]

FINDINGS: Osseous demineralization.

AC joint alignment normal, with joint space slightly wider than on
the previous exam.

Small inferior clavicular spur noted.

No acute fracture, dislocation or bone destruction.

Visualized LEFT ribs intact.

Generator from a LEFT subclavian transvenous pacemaker projects over
upper LEFT ribs..
IMPRESSION: Mild degenerative changes of the LEFT AC joint, which appears
minimally wider than on the previous exam question positional.

No additional osseous abnormalities.

## 2018-11-23 ENCOUNTER — Other Ambulatory Visit: Payer: Self-pay | Admitting: Cardiology

## 2018-11-24 DIAGNOSIS — I10 Essential (primary) hypertension: Secondary | ICD-10-CM | POA: Diagnosis not present

## 2018-11-24 DIAGNOSIS — N400 Enlarged prostate without lower urinary tract symptoms: Secondary | ICD-10-CM | POA: Diagnosis not present

## 2018-11-24 DIAGNOSIS — I480 Paroxysmal atrial fibrillation: Secondary | ICD-10-CM | POA: Diagnosis not present

## 2018-11-24 DIAGNOSIS — I2510 Atherosclerotic heart disease of native coronary artery without angina pectoris: Secondary | ICD-10-CM | POA: Diagnosis not present

## 2018-11-24 DIAGNOSIS — F0280 Dementia in other diseases classified elsewhere without behavioral disturbance: Secondary | ICD-10-CM | POA: Diagnosis not present

## 2018-11-24 DIAGNOSIS — G309 Alzheimer's disease, unspecified: Secondary | ICD-10-CM | POA: Diagnosis not present

## 2018-11-26 DIAGNOSIS — N400 Enlarged prostate without lower urinary tract symptoms: Secondary | ICD-10-CM | POA: Diagnosis not present

## 2018-11-26 DIAGNOSIS — F0280 Dementia in other diseases classified elsewhere without behavioral disturbance: Secondary | ICD-10-CM | POA: Diagnosis not present

## 2018-11-26 DIAGNOSIS — G309 Alzheimer's disease, unspecified: Secondary | ICD-10-CM | POA: Diagnosis not present

## 2018-11-26 DIAGNOSIS — I2510 Atherosclerotic heart disease of native coronary artery without angina pectoris: Secondary | ICD-10-CM | POA: Diagnosis not present

## 2018-11-26 DIAGNOSIS — I480 Paroxysmal atrial fibrillation: Secondary | ICD-10-CM | POA: Diagnosis not present

## 2018-11-26 DIAGNOSIS — I10 Essential (primary) hypertension: Secondary | ICD-10-CM | POA: Diagnosis not present

## 2018-11-27 DIAGNOSIS — N400 Enlarged prostate without lower urinary tract symptoms: Secondary | ICD-10-CM | POA: Diagnosis not present

## 2018-11-27 DIAGNOSIS — F0280 Dementia in other diseases classified elsewhere without behavioral disturbance: Secondary | ICD-10-CM | POA: Diagnosis not present

## 2018-11-27 DIAGNOSIS — I10 Essential (primary) hypertension: Secondary | ICD-10-CM | POA: Diagnosis not present

## 2018-11-27 DIAGNOSIS — I480 Paroxysmal atrial fibrillation: Secondary | ICD-10-CM | POA: Diagnosis not present

## 2018-11-27 DIAGNOSIS — I2510 Atherosclerotic heart disease of native coronary artery without angina pectoris: Secondary | ICD-10-CM | POA: Diagnosis not present

## 2018-11-27 DIAGNOSIS — G309 Alzheimer's disease, unspecified: Secondary | ICD-10-CM | POA: Diagnosis not present

## 2018-12-02 DIAGNOSIS — I480 Paroxysmal atrial fibrillation: Secondary | ICD-10-CM | POA: Diagnosis not present

## 2018-12-02 DIAGNOSIS — F0280 Dementia in other diseases classified elsewhere without behavioral disturbance: Secondary | ICD-10-CM | POA: Diagnosis not present

## 2018-12-02 DIAGNOSIS — I2510 Atherosclerotic heart disease of native coronary artery without angina pectoris: Secondary | ICD-10-CM | POA: Diagnosis not present

## 2018-12-02 DIAGNOSIS — G309 Alzheimer's disease, unspecified: Secondary | ICD-10-CM | POA: Diagnosis not present

## 2018-12-02 DIAGNOSIS — I10 Essential (primary) hypertension: Secondary | ICD-10-CM | POA: Diagnosis not present

## 2018-12-02 DIAGNOSIS — N400 Enlarged prostate without lower urinary tract symptoms: Secondary | ICD-10-CM | POA: Diagnosis not present

## 2018-12-04 DIAGNOSIS — I10 Essential (primary) hypertension: Secondary | ICD-10-CM | POA: Diagnosis not present

## 2018-12-04 DIAGNOSIS — N400 Enlarged prostate without lower urinary tract symptoms: Secondary | ICD-10-CM | POA: Diagnosis not present

## 2018-12-04 DIAGNOSIS — G309 Alzheimer's disease, unspecified: Secondary | ICD-10-CM | POA: Diagnosis not present

## 2018-12-04 DIAGNOSIS — I2510 Atherosclerotic heart disease of native coronary artery without angina pectoris: Secondary | ICD-10-CM | POA: Diagnosis not present

## 2018-12-04 DIAGNOSIS — F0280 Dementia in other diseases classified elsewhere without behavioral disturbance: Secondary | ICD-10-CM | POA: Diagnosis not present

## 2018-12-04 DIAGNOSIS — I480 Paroxysmal atrial fibrillation: Secondary | ICD-10-CM | POA: Diagnosis not present

## 2018-12-05 DIAGNOSIS — I2510 Atherosclerotic heart disease of native coronary artery without angina pectoris: Secondary | ICD-10-CM | POA: Diagnosis not present

## 2018-12-05 DIAGNOSIS — G309 Alzheimer's disease, unspecified: Secondary | ICD-10-CM | POA: Diagnosis not present

## 2018-12-05 DIAGNOSIS — I10 Essential (primary) hypertension: Secondary | ICD-10-CM | POA: Diagnosis not present

## 2018-12-05 DIAGNOSIS — N400 Enlarged prostate without lower urinary tract symptoms: Secondary | ICD-10-CM | POA: Diagnosis not present

## 2018-12-05 DIAGNOSIS — F0280 Dementia in other diseases classified elsewhere without behavioral disturbance: Secondary | ICD-10-CM | POA: Diagnosis not present

## 2018-12-05 DIAGNOSIS — I480 Paroxysmal atrial fibrillation: Secondary | ICD-10-CM | POA: Diagnosis not present

## 2018-12-09 DIAGNOSIS — G309 Alzheimer's disease, unspecified: Secondary | ICD-10-CM | POA: Diagnosis not present

## 2018-12-09 DIAGNOSIS — I10 Essential (primary) hypertension: Secondary | ICD-10-CM | POA: Diagnosis not present

## 2018-12-09 DIAGNOSIS — F0280 Dementia in other diseases classified elsewhere without behavioral disturbance: Secondary | ICD-10-CM | POA: Diagnosis not present

## 2018-12-09 DIAGNOSIS — I2510 Atherosclerotic heart disease of native coronary artery without angina pectoris: Secondary | ICD-10-CM | POA: Diagnosis not present

## 2018-12-09 DIAGNOSIS — N400 Enlarged prostate without lower urinary tract symptoms: Secondary | ICD-10-CM | POA: Diagnosis not present

## 2018-12-09 DIAGNOSIS — I480 Paroxysmal atrial fibrillation: Secondary | ICD-10-CM | POA: Diagnosis not present

## 2018-12-10 DIAGNOSIS — G309 Alzheimer's disease, unspecified: Secondary | ICD-10-CM | POA: Diagnosis not present

## 2018-12-10 DIAGNOSIS — I480 Paroxysmal atrial fibrillation: Secondary | ICD-10-CM | POA: Diagnosis not present

## 2018-12-10 DIAGNOSIS — I2510 Atherosclerotic heart disease of native coronary artery without angina pectoris: Secondary | ICD-10-CM | POA: Diagnosis not present

## 2018-12-10 DIAGNOSIS — N400 Enlarged prostate without lower urinary tract symptoms: Secondary | ICD-10-CM | POA: Diagnosis not present

## 2018-12-10 DIAGNOSIS — I10 Essential (primary) hypertension: Secondary | ICD-10-CM | POA: Diagnosis not present

## 2018-12-10 DIAGNOSIS — F0280 Dementia in other diseases classified elsewhere without behavioral disturbance: Secondary | ICD-10-CM | POA: Diagnosis not present

## 2018-12-11 DIAGNOSIS — F0280 Dementia in other diseases classified elsewhere without behavioral disturbance: Secondary | ICD-10-CM | POA: Diagnosis not present

## 2018-12-11 DIAGNOSIS — I480 Paroxysmal atrial fibrillation: Secondary | ICD-10-CM | POA: Diagnosis not present

## 2018-12-11 DIAGNOSIS — I2510 Atherosclerotic heart disease of native coronary artery without angina pectoris: Secondary | ICD-10-CM | POA: Diagnosis not present

## 2018-12-11 DIAGNOSIS — G309 Alzheimer's disease, unspecified: Secondary | ICD-10-CM | POA: Diagnosis not present

## 2018-12-11 DIAGNOSIS — I10 Essential (primary) hypertension: Secondary | ICD-10-CM | POA: Diagnosis not present

## 2018-12-11 DIAGNOSIS — N400 Enlarged prostate without lower urinary tract symptoms: Secondary | ICD-10-CM | POA: Diagnosis not present

## 2018-12-15 DIAGNOSIS — G309 Alzheimer's disease, unspecified: Secondary | ICD-10-CM | POA: Diagnosis not present

## 2018-12-15 DIAGNOSIS — F0280 Dementia in other diseases classified elsewhere without behavioral disturbance: Secondary | ICD-10-CM | POA: Diagnosis not present

## 2018-12-15 DIAGNOSIS — I2510 Atherosclerotic heart disease of native coronary artery without angina pectoris: Secondary | ICD-10-CM | POA: Diagnosis not present

## 2018-12-15 DIAGNOSIS — I10 Essential (primary) hypertension: Secondary | ICD-10-CM | POA: Diagnosis not present

## 2018-12-15 DIAGNOSIS — N400 Enlarged prostate without lower urinary tract symptoms: Secondary | ICD-10-CM | POA: Diagnosis not present

## 2018-12-15 DIAGNOSIS — I480 Paroxysmal atrial fibrillation: Secondary | ICD-10-CM | POA: Diagnosis not present

## 2018-12-16 DIAGNOSIS — Z8673 Personal history of transient ischemic attack (TIA), and cerebral infarction without residual deficits: Secondary | ICD-10-CM | POA: Diagnosis not present

## 2018-12-16 DIAGNOSIS — F329 Major depressive disorder, single episode, unspecified: Secondary | ICD-10-CM | POA: Diagnosis not present

## 2018-12-16 DIAGNOSIS — Z85118 Personal history of other malignant neoplasm of bronchus and lung: Secondary | ICD-10-CM | POA: Diagnosis not present

## 2018-12-16 DIAGNOSIS — I480 Paroxysmal atrial fibrillation: Secondary | ICD-10-CM | POA: Diagnosis not present

## 2018-12-16 DIAGNOSIS — N400 Enlarged prostate without lower urinary tract symptoms: Secondary | ICD-10-CM | POA: Diagnosis not present

## 2018-12-16 DIAGNOSIS — F0280 Dementia in other diseases classified elsewhere without behavioral disturbance: Secondary | ICD-10-CM | POA: Diagnosis not present

## 2018-12-16 DIAGNOSIS — I10 Essential (primary) hypertension: Secondary | ICD-10-CM | POA: Diagnosis not present

## 2018-12-16 DIAGNOSIS — I2510 Atherosclerotic heart disease of native coronary artery without angina pectoris: Secondary | ICD-10-CM | POA: Diagnosis not present

## 2018-12-16 DIAGNOSIS — Z9181 History of falling: Secondary | ICD-10-CM | POA: Diagnosis not present

## 2018-12-16 DIAGNOSIS — Z7901 Long term (current) use of anticoagulants: Secondary | ICD-10-CM | POA: Diagnosis not present

## 2018-12-16 DIAGNOSIS — I495 Sick sinus syndrome: Secondary | ICD-10-CM | POA: Diagnosis not present

## 2018-12-16 DIAGNOSIS — Z950 Presence of cardiac pacemaker: Secondary | ICD-10-CM | POA: Diagnosis not present

## 2018-12-16 DIAGNOSIS — G309 Alzheimer's disease, unspecified: Secondary | ICD-10-CM | POA: Diagnosis not present

## 2018-12-16 DIAGNOSIS — Z902 Acquired absence of lung [part of]: Secondary | ICD-10-CM | POA: Diagnosis not present

## 2018-12-23 DIAGNOSIS — I2510 Atherosclerotic heart disease of native coronary artery without angina pectoris: Secondary | ICD-10-CM | POA: Diagnosis not present

## 2018-12-23 DIAGNOSIS — N400 Enlarged prostate without lower urinary tract symptoms: Secondary | ICD-10-CM | POA: Diagnosis not present

## 2018-12-23 DIAGNOSIS — F0280 Dementia in other diseases classified elsewhere without behavioral disturbance: Secondary | ICD-10-CM | POA: Diagnosis not present

## 2018-12-23 DIAGNOSIS — G309 Alzheimer's disease, unspecified: Secondary | ICD-10-CM | POA: Diagnosis not present

## 2018-12-23 DIAGNOSIS — I10 Essential (primary) hypertension: Secondary | ICD-10-CM | POA: Diagnosis not present

## 2018-12-23 DIAGNOSIS — I480 Paroxysmal atrial fibrillation: Secondary | ICD-10-CM | POA: Diagnosis not present

## 2018-12-24 ENCOUNTER — Emergency Department (HOSPITAL_COMMUNITY)
Admission: EM | Admit: 2018-12-24 | Discharge: 2018-12-24 | Disposition: A | Discharge status: Home / Self Care | Payer: Medicare Other | Attending: Emergency Medicine | Admitting: Emergency Medicine

## 2018-12-24 ENCOUNTER — Emergency Department (HOSPITAL_COMMUNITY): Payer: Medicare Other

## 2018-12-24 ENCOUNTER — Other Ambulatory Visit: Payer: Self-pay

## 2018-12-24 ENCOUNTER — Encounter (HOSPITAL_COMMUNITY): Payer: Self-pay | Admitting: Emergency Medicine

## 2018-12-24 DIAGNOSIS — W19XXXA Unspecified fall, initial encounter: Secondary | ICD-10-CM | POA: Diagnosis not present

## 2018-12-24 DIAGNOSIS — I10 Essential (primary) hypertension: Secondary | ICD-10-CM | POA: Diagnosis not present

## 2018-12-24 DIAGNOSIS — S0101XA Laceration without foreign body of scalp, initial encounter: Secondary | ICD-10-CM | POA: Diagnosis not present

## 2018-12-24 DIAGNOSIS — Y999 Unspecified external cause status: Secondary | ICD-10-CM | POA: Diagnosis not present

## 2018-12-24 DIAGNOSIS — Z23 Encounter for immunization: Secondary | ICD-10-CM | POA: Insufficient documentation

## 2018-12-24 DIAGNOSIS — Z79899 Other long term (current) drug therapy: Secondary | ICD-10-CM | POA: Diagnosis not present

## 2018-12-24 DIAGNOSIS — I2510 Atherosclerotic heart disease of native coronary artery without angina pectoris: Secondary | ICD-10-CM | POA: Insufficient documentation

## 2018-12-24 DIAGNOSIS — Y9389 Activity, other specified: Secondary | ICD-10-CM | POA: Insufficient documentation

## 2018-12-24 DIAGNOSIS — I480 Paroxysmal atrial fibrillation: Secondary | ICD-10-CM | POA: Diagnosis not present

## 2018-12-24 DIAGNOSIS — W01198A Fall on same level from slipping, tripping and stumbling with subsequent striking against other object, initial encounter: Secondary | ICD-10-CM | POA: Insufficient documentation

## 2018-12-24 DIAGNOSIS — S0990XA Unspecified injury of head, initial encounter: Secondary | ICD-10-CM | POA: Diagnosis not present

## 2018-12-24 DIAGNOSIS — Z950 Presence of cardiac pacemaker: Secondary | ICD-10-CM | POA: Diagnosis not present

## 2018-12-24 DIAGNOSIS — Z7901 Long term (current) use of anticoagulants: Secondary | ICD-10-CM | POA: Diagnosis not present

## 2018-12-24 DIAGNOSIS — Y929 Unspecified place or not applicable: Secondary | ICD-10-CM | POA: Insufficient documentation

## 2018-12-24 DIAGNOSIS — R58 Hemorrhage, not elsewhere classified: Secondary | ICD-10-CM | POA: Diagnosis not present

## 2018-12-24 DIAGNOSIS — S199XXA Unspecified injury of neck, initial encounter: Secondary | ICD-10-CM | POA: Diagnosis not present

## 2018-12-24 MED ORDER — TETANUS-DIPHTH-ACELL PERTUSSIS 5-2.5-18.5 LF-MCG/0.5 IM SUSP
0.5000 mL | Freq: Once | INTRAMUSCULAR | Status: AC
  Administered 2018-12-24: 0.5 mL via INTRAMUSCULAR
  Filled 2018-12-24: qty 0.5

## 2018-12-24 MED ORDER — BACITRACIN ZINC 500 UNIT/GM EX OINT
TOPICAL_OINTMENT | CUTANEOUS | Status: AC
  Administered 2018-12-24: 19:00:00
  Filled 2018-12-24: qty 0.9

## 2018-12-24 NOTE — ED Provider Notes (Signed)
[facility] EMERGENCY DEPARTMENT Provider Note   CSN: [id number] Arrival date & time: 12/24/18  1557     History   Chief Complaint Chief Complaint  Patient presents with  . Fall    HPI Karl Howell is a 85 y.o. male.  HPI Patient with history of dementia.  Unable to contribute to history.  Level 5 caveat applies.  Patient had a witnessed mechanical fall from standing.  [place] backwards and struck his head on a door frame.  Questionable brief loss of consciousness.  Sustained laceration to the back of the head.  Unknown last tetanus. Past Medical History:  Diagnosis Date  . Arthritis   . Cancer ([place])    lung  . Coronary artery disease    ATHERECTOMY DONE IN [place].  [name] Dementia ([place])   . HTN (hypertension)   . Hyperlipidemia   . Paroxysmal atrial fibrillation ([facility])   . Symptomatic bradycardia    status post permanent transvenous pacemaker insertion by [name]    Patient Active Problem List   Diagnosis Date Noted  . Atrial fibrillation ([place]) 05/25/2015  . Pacemaker 08/24/2014  . Sinus pause 04/22/2014  . Atrioventricular block, complete ([place]) 04/22/2014  . Bradycardia 04/19/2014  . Dizziness 04/19/2014  . Coronary artery disease 10/29/2013  . Essential hypertension 10/29/2013  . Hyperlipidemia 10/29/2013  . HIP PAIN 05/11/2010  . SPINAL STENOSIS, LUMBAR 07/22/2008  . DEGENERATIVE JOINT DISEASE, KNEE 12/11/2007  . KNEE PAIN 12/11/2007    Past Surgical History:  Procedure Laterality Date  . CARDIAC CATHETERIZATION     1996    "cleaned out artery out"....no problems since  . CORONARY ANGIOPLASTY     1996  ??? in [place], [place]  . MULTIPLE TOOTH EXTRACTIONS    . PACEMAKER INSERTION  04-22-2014   STJ dual chamber pacemaker implanted for symptomatic sinus pauses  . RENAL ARTERY DUPLEX  03/11/13   NORMAL.  [name] STRESS MYOCARDIAL PERFUSION STUDY  09/18/10   MILD TO MODERATE PERFUSION DEFECT IN THE BASAL INFERIOR AND MID INFERIOR REGION. THIS IS CONSISTANT WITH  INFARCT/SCAR. NO ISCHEMIA.  . TONSILLECTOMY    . TOTAL KNEE ARTHROPLASTY  11/28/2011   Procedure: TOTAL KNEE ARTHROPLASTY;  Surgeon: [name], MD;  Location: [place];  Service: Orthopedics;  Laterality: Left;  . TRANSTHORACIC ECHOCARDIOGRAM  03/11/13   EF 55% TO 60%. GRADE 1 DIASTOLIC DYSFUNCTION. VENTRICULAR SEPTUM: THICKNESS IS MILDLY INCREASED. SEPTAL MOTION SHOWED ABNORMAL FUNCTION AND DYSSYNERGY. MV: MILD REGURG.         Home Medications    Prior to Admission medications   Medication Sig Start Date End Date Taking? Authorizing Provider  amLODipine (NORVASC) 2.5 MG tablet TAKE 1 TABLET DAILY Patient taking differently: Take 2.5 mg by mouth daily.  06/28/17  Yes Baldwin Jamaica, PA-C  cholecalciferol (VITAMIN D) 1000 units tablet Take 1,000 Units by mouth daily.   Yes [provider]  losartan (COZAAR) 100 MG tablet TAKE 1 TABLET DAILY Patient taking differently: Take 100 mg by mouth daily.  11/26/18  Yes [name], MD  thiamine 100 MG tablet Take 1 tablet by mouth daily.    Yes [provider]  XARELTO 20 MG TABS tablet TAKE 1 TABLET DAILY WITH SUPPER Patient taking differently: Take 20 mg by mouth daily.  05/27/18  Yes Minus Breeding, MD    Family History Family History  Problem Relation Age of Onset  . Heart attack Mother 15  . Stroke Father 60  .  Stroke Sister 39  . Heart attack Brother   . Stroke Sister   . Stroke Sister   . Heart attack Brother     Social History Social History   Tobacco Use  . Smoking status: Never Smoker  . Smokeless tobacco: Never Used  Substance Use Topics  . Alcohol use: Yes    Alcohol/week: 2.0 standard drinks    Types: 2 Cans of beer per week    Comment: occasionally  . Drug use: No     Allergies   Other; Meclizine; and Hydrocodone   Review of Systems Review of Systems  Unable to perform ROS: Dementia     Physical Exam Updated Vital Signs BP (!) 130/93 (BP Location: Left Arm)   Pulse 73    Temp (!) 97.5 F (36.4 C) (Oral)   Resp 18   Ht 5\' 11"  (1.803 m)   Wt 61.7 kg   SpO2 100%   BMI 18.97 kg/m   Physical Exam Vitals signs and nursing note reviewed.  Constitutional:      Appearance: Normal appearance. He is well-developed.  HENT:     Head: Normocephalic.     Comments: Patient with a 4 cm vertical laceration to the occipital scalp.  No active bleeding.  No intraoral trauma.  Midface is stable. Eyes:     Pupils: Pupils are equal, round, and reactive to light.  Neck:     Musculoskeletal: Normal range of motion and neck supple.     Comments: No definite tenderness with palpation over the midline of the cervical spine.  No step-offs or deformity. Cardiovascular:     Rate and Rhythm: Normal rate and regular rhythm.  Pulmonary:     Effort: Pulmonary effort is normal.     Breath sounds: Normal breath sounds.  Abdominal:     General: Bowel sounds are normal.     Palpations: Abdomen is soft.     Tenderness: There is no abdominal tenderness. There is no guarding or rebound.  Musculoskeletal: Normal range of motion.        General: No swelling, tenderness, deformity or signs of injury.     Right lower leg: No edema.     Left lower leg: No edema.  Skin:    General: Skin is warm and dry.     Findings: No erythema or rash.  Neurological:     Mental Status: He is alert.     Comments: Intermittently following commands.  Does not appear to have any focal deficits.      ED Treatments / Results  Labs (all labs ordered are listed, but only abnormal results are displayed) Labs Reviewed - No data to display  EKG None  Radiology Ct Head Wo Contrast  Result Date: 12/24/2018 CLINICAL DATA:  Status post fall with laceration to the back of the head. EXAM: CT HEAD WITHOUT CONTRAST CT CERVICAL SPINE WITHOUT CONTRAST TECHNIQUE: Multidetector CT imaging of the head and cervical spine was performed following the standard protocol without intravenous contrast. Multiplanar CT image  reconstructions of the cervical spine were also generated. COMPARISON:  October 03, 2018 CT cervical spine, August 11, 2018 head CT. FINDINGS: CT HEAD FINDINGS Brain: No evidence of acute infarction, hemorrhage, hydrocephalus, extra-axial collection or mass lesion/mass effect. Chronic diffuse atrophy is identified. Extensive periventricular white matter small vessel ischemic changes noted. There are small old infarcts in bilateral basal ganglia and bilateral thalamus unchanged. Vascular: No hyperdense vessel is noted. Skull: Normal. Negative for fracture or focal lesion. Sinuses/Orbits: The  orbits are normal. Mucoperiosteal thickening of bilateral ethmoid sinuses are noted. Other: None. CT CERVICAL SPINE FINDINGS Alignment: There is straightening of cervical spine. Skull base and vertebrae: No acute fracture. No primary bone lesion or focal pathologic process. Soft tissues and spinal canal: No prevertebral fluid or swelling. No visible canal hematoma. Disc levels: Extensive degenerative joint changes with marked narrowed joint space and osteophyte formation are identified throughout the cervical spine. Upper chest: Negative. Other: None. IMPRESSION: No focal acute intracranial abnormality identified. Chronic diffuse atrophy and chronic bilateral periventricular white matter small vessel ischemic change. No acute fracture or dislocation of cervical spine. Degenerative joint changes of cervical spine. Electronically Signed   By: Abelardo Diesel M.D.   On: 12/24/2018 18:03   Ct Cervical Spine Wo Contrast  Result Date: 12/24/2018 CLINICAL DATA:  Status post fall with laceration to the back of the head. EXAM: CT HEAD WITHOUT CONTRAST CT CERVICAL SPINE WITHOUT CONTRAST TECHNIQUE: Multidetector CT imaging of the head and cervical spine was performed following the standard protocol without intravenous contrast. Multiplanar CT image reconstructions of the cervical spine were also generated. COMPARISON:  October 03, 2018 CT  cervical spine, August 11, 2018 head CT. FINDINGS: CT HEAD FINDINGS Brain: No evidence of acute infarction, hemorrhage, hydrocephalus, extra-axial collection or mass lesion/mass effect. Chronic diffuse atrophy is identified. Extensive periventricular white matter small vessel ischemic changes noted. There are small old infarcts in bilateral basal ganglia and bilateral thalamus unchanged. Vascular: No hyperdense vessel is noted. Skull: Normal. Negative for fracture or focal lesion. Sinuses/Orbits: The orbits are normal. Mucoperiosteal thickening of bilateral ethmoid sinuses are noted. Other: None. CT CERVICAL SPINE FINDINGS Alignment: There is straightening of cervical spine. Skull base and vertebrae: No acute fracture. No primary bone lesion or focal pathologic process. Soft tissues and spinal canal: No prevertebral fluid or swelling. No visible canal hematoma. Disc levels: Extensive degenerative joint changes with marked narrowed joint space and osteophyte formation are identified throughout the cervical spine. Upper chest: Negative. Other: None. IMPRESSION: No focal acute intracranial abnormality identified. Chronic diffuse atrophy and chronic bilateral periventricular white matter small vessel ischemic change. No acute fracture or dislocation of cervical spine. Degenerative joint changes of cervical spine. Electronically Signed   By: Abelardo Diesel M.D.   On: 12/24/2018 18:03    Procedures .Marland KitchenLaceration Repair Date/Time: 12/24/2018 7:06 PM Performed by: Julianne Rice, MD Authorized by: Julianne Rice, MD   Laceration details:    Location:  Scalp   Scalp location:  Occipital   Length (cm):  4 Repair type:    Repair type:  Simple Pre-procedure details:    Preparation:  Imaging obtained to evaluate for foreign bodies Exploration:    Contaminated: no   Treatment:    Amount of cleaning:  Standard   Irrigation solution:  Sterile saline   Visualized foreign bodies/material removed: no   Skin  repair:    Repair method:  Staples   Number of staples:  3 Approximation:    Approximation:  Close Post-procedure details:    Dressing:  Antibiotic ointment   Patient tolerance of procedure:  Tolerated well, no immediate complications   (including critical care time)  Medications Ordered in ED Medications  Tdap (BOOSTRIX) injection 0.5 mL (0.5 mLs Intramuscular Given 12/24/18 1902)     Initial Impression / Assessment and Plan / ED Course  I have reviewed the triage vital signs and the nursing notes.  Pertinent labs & imaging results that were available during my care of the patient were  reviewed by me and considered in my medical decision making (see chart for details).     Laceration was repaired in the emergency department.  CT head and cervical spine without acute findings.  Head injury precautions given.  Caretakers understand patient will need to have staples removed in 1 week. Patient is currently at his baseline mental status.  Tetanus has been updated.  Final Clinical Impressions(s) / ED Diagnoses   Final diagnoses:  Laceration of scalp, initial encounter  Closed head injury, initial encounter    ED Discharge Orders    None       Julianne Rice, MD 12/24/18 [place]

## 2018-12-24 NOTE — Discharge Instructions (Addendum)
Apply antibiotic ointment twice daily to the scalp wound.  Avoid submersion in water for 24 hours.  Staples need to be removed in 1 week.

## 2018-12-24 NOTE — ED Triage Notes (Signed)
Pt here for a fall backwards at home.  States he was getting up and his feet became tangled in the blanket causing him to fall.  Laceration to back of head.

## 2018-12-25 DIAGNOSIS — I10 Essential (primary) hypertension: Secondary | ICD-10-CM | POA: Diagnosis not present

## 2018-12-25 DIAGNOSIS — F0280 Dementia in other diseases classified elsewhere without behavioral disturbance: Secondary | ICD-10-CM | POA: Diagnosis not present

## 2018-12-25 DIAGNOSIS — N400 Enlarged prostate without lower urinary tract symptoms: Secondary | ICD-10-CM | POA: Diagnosis not present

## 2018-12-25 DIAGNOSIS — I2510 Atherosclerotic heart disease of native coronary artery without angina pectoris: Secondary | ICD-10-CM | POA: Diagnosis not present

## 2018-12-25 DIAGNOSIS — I480 Paroxysmal atrial fibrillation: Secondary | ICD-10-CM | POA: Diagnosis not present

## 2018-12-25 DIAGNOSIS — G309 Alzheimer's disease, unspecified: Secondary | ICD-10-CM | POA: Diagnosis not present

## 2018-12-29 ENCOUNTER — Telehealth: Payer: Self-pay | Admitting: *Deleted

## 2018-12-29 NOTE — Telephone Encounter (Signed)
Spoke with patient's wife, Letta Median [facility]), regarding HVR episode from 12/28/18 at 1045. She reports patient was sitting watching TV at the time. Patient denies any symptoms with episode. Advised that [name] will review the episode and we will call back if any recommended changes. Advised patient and wife do not need to do anything at this time for the scheduled remote check on 12/30/18. Faye verbalizes understanding and denies questions at this time.

## 2018-12-30 ENCOUNTER — Ambulatory Visit (INDEPENDENT_AMBULATORY_CARE_PROVIDER_SITE_OTHER): Payer: Medicare Other

## 2018-12-30 DIAGNOSIS — I442 Atrioventricular block, complete: Secondary | ICD-10-CM | POA: Diagnosis not present

## 2018-12-30 DIAGNOSIS — I495 Sick sinus syndrome: Secondary | ICD-10-CM

## 2018-12-31 ENCOUNTER — Emergency Department (HOSPITAL_COMMUNITY)
Admission: EM | Admit: 2018-12-31 | Discharge: 2018-12-31 | Disposition: A | Discharge status: Home / Self Care | Payer: Medicare Other | Attending: Emergency Medicine | Admitting: Emergency Medicine

## 2018-12-31 ENCOUNTER — Other Ambulatory Visit: Payer: Self-pay

## 2018-12-31 ENCOUNTER — Encounter (HOSPITAL_COMMUNITY): Payer: Self-pay | Admitting: Emergency Medicine

## 2018-12-31 DIAGNOSIS — W19XXXD Unspecified fall, subsequent encounter: Secondary | ICD-10-CM | POA: Insufficient documentation

## 2018-12-31 DIAGNOSIS — I10 Essential (primary) hypertension: Secondary | ICD-10-CM | POA: Insufficient documentation

## 2018-12-31 DIAGNOSIS — F0390 Unspecified dementia without behavioral disturbance: Secondary | ICD-10-CM | POA: Diagnosis not present

## 2018-12-31 DIAGNOSIS — I259 Chronic ischemic heart disease, unspecified: Secondary | ICD-10-CM | POA: Diagnosis not present

## 2018-12-31 DIAGNOSIS — S0181XD Laceration without foreign body of other part of head, subsequent encounter: Secondary | ICD-10-CM | POA: Insufficient documentation

## 2018-12-31 DIAGNOSIS — Z79899 Other long term (current) drug therapy: Secondary | ICD-10-CM | POA: Diagnosis not present

## 2018-12-31 DIAGNOSIS — Z4802 Encounter for removal of sutures: Secondary | ICD-10-CM

## 2018-12-31 NOTE — ED Provider Notes (Signed)
[facility] Provider Note   CSN: [id number] Arrival date & time: 12/31/18  1051     History   Chief Complaint Chief Complaint  Patient presents with  . Suture / Staple Removal    HPI Karl Howell is a 85 y.o. male.  Patient is an 85 year old male who presents presents to the emergency department for staple removal.  The patient sustained a fall approximately a week ago and required staple repair of a laceration to the back of the head.  The patient presents now to have the staples removed.  No complaints of drainage or red streaking.  No high fever.  No unusual headache.  The history is provided by a caregiver.  Suture / Staple Removal  Pertinent negatives include no chest pain, no abdominal pain and no shortness of breath.    Past Medical History:  Diagnosis Date  . Arthritis   . Cancer ([place])    lung  . Coronary artery disease    ATHERECTOMY DONE IN [place].  [name] Dementia ([place])   . HTN (hypertension)   . Hyperlipidemia   . Paroxysmal atrial fibrillation ([facility])   . Symptomatic bradycardia    status post permanent transvenous pacemaker insertion by [name]    Patient Active Problem List   Diagnosis Date Noted  . Atrial fibrillation ([place]) 05/25/2015  . Pacemaker 08/24/2014  . Sinus pause 04/22/2014  . Atrioventricular block, complete ([place]) 04/22/2014  . Bradycardia 04/19/2014  . Dizziness 04/19/2014  . Coronary artery disease 10/29/2013  . Essential hypertension 10/29/2013  . Hyperlipidemia 10/29/2013  . HIP PAIN 05/11/2010  . SPINAL STENOSIS, LUMBAR 07/22/2008  . DEGENERATIVE JOINT DISEASE, KNEE 12/11/2007  . KNEE PAIN 12/11/2007    Past Surgical History:  Procedure Laterality Date  . CARDIAC CATHETERIZATION     1996    "cleaned out artery out"....no problems since  . CORONARY ANGIOPLASTY     1996  ??? in [place], [place]  . MULTIPLE TOOTH EXTRACTIONS    . PACEMAKER INSERTION  04-22-2014   STJ dual chamber pacemaker implanted  for symptomatic sinus pauses  . RENAL ARTERY DUPLEX  03/11/13   NORMAL.  [name] STRESS MYOCARDIAL PERFUSION STUDY  09/18/10   MILD TO MODERATE PERFUSION DEFECT IN THE BASAL INFERIOR AND MID INFERIOR REGION. THIS IS CONSISTANT WITH INFARCT/SCAR. NO ISCHEMIA.  . TONSILLECTOMY    . TOTAL KNEE ARTHROPLASTY  11/28/2011   Procedure: TOTAL KNEE ARTHROPLASTY;  Surgeon: [name], MD;  Location: [facility];  Service: Orthopedics;  Laterality: Left;  . TRANSTHORACIC ECHOCARDIOGRAM  03/11/13   EF 55% TO 60%. GRADE 1 DIASTOLIC DYSFUNCTION. VENTRICULAR SEPTUM: THICKNESS IS MILDLY INCREASED. SEPTAL MOTION SHOWED ABNORMAL FUNCTION AND DYSSYNERGY. MV: MILD REGURG.         Home Medications    Prior to Admission medications   Medication Sig Start Date End Date Taking? Authorizing Provider  amLODipine (NORVASC) 2.5 MG tablet TAKE 1 TABLET DAILY Patient taking differently: Take 2.5 mg by mouth daily.  06/28/17   Baldwin Jamaica, PA-C  cholecalciferol (VITAMIN D) 1000 units tablet Take 1,000 Units by mouth daily.    [provider]  losartan (COZAAR) 100 MG tablet TAKE 1 TABLET DAILY Patient taking differently: Take 100 mg by mouth daily.  11/26/18   [name], MD  thiamine 100 MG tablet Take 1 tablet by mouth daily.     [provider]  XARELTO 20 MG TABS tablet TAKE 1 TABLET DAILY WITH SUPPER  Patient taking differently: Take 20 mg by mouth daily.  05/27/18   Minus Breeding, MD    Family History Family History  Problem Relation Age of Onset  . Heart attack Mother 17  . Stroke Father 39  . Stroke Sister 39  . Heart attack Brother   . Stroke Sister   . Stroke Sister   . Heart attack Brother     Social History Social History   Tobacco Use  . Smoking status: Never Smoker  . Smokeless tobacco: Never Used  Substance Use Topics  . Alcohol use: Yes    Alcohol/week: 2.0 standard drinks    Types: 2 Cans of beer per week    Comment: occasionally  . Drug use: No     Allergies    Other; Meclizine; and Hydrocodone   Review of Systems Review of Systems  Constitutional: Negative for activity change.       All ROS Neg except as noted in HPI  HENT: Negative for nosebleeds.   Eyes: Negative for photophobia and discharge.  Respiratory: Negative for cough, shortness of breath and wheezing.   Cardiovascular: Negative for chest pain and palpitations.  Gastrointestinal: Negative for abdominal pain and blood in stool.  Genitourinary: Negative for dysuria, frequency and hematuria.  Musculoskeletal: Positive for arthralgias. Negative for back pain and neck pain.  Skin: Negative.   Neurological: Negative for dizziness, seizures and speech difficulty.  Psychiatric/Behavioral: Negative for confusion and hallucinations.     Physical Exam Updated Vital Signs BP (!) 172/81 (BP Location: Left Arm)   Pulse 68   Temp 97.6 F (36.4 C) (Oral)   Resp 18   Ht 5\' 11"  (1.803 m)   Wt 61.7 kg   SpO2 100%   BMI 18.97 kg/m   Physical Exam Vitals signs and nursing note reviewed.  Constitutional:      Appearance: He is well-developed. He is not toxic-appearing.  HENT:     Head: Normocephalic.      Right Ear: Tympanic membrane and external ear normal.     Left Ear: Tympanic membrane and external ear normal.  Eyes:     General: Lids are normal.     Pupils: Pupils are equal, round, and reactive to light.  Neck:     Musculoskeletal: Normal range of motion and neck supple.     Vascular: No carotid bruit.  Cardiovascular:     Rate and Rhythm: Normal rate.  Extrasystoles are present.    Pulses: Normal pulses.     Heart sounds: Normal heart sounds.  Pulmonary:     Effort: No respiratory distress.     Breath sounds: Normal breath sounds.  Abdominal:     General: Bowel sounds are normal.     Palpations: Abdomen is soft.     Tenderness: There is no abdominal tenderness. There is no guarding.  Musculoskeletal: Normal range of motion.  Lymphadenopathy:     Head:     Right  side of head: No submandibular adenopathy.     Left side of head: No submandibular adenopathy.     Cervical: No cervical adenopathy.  Skin:    General: Skin is warm and dry.  Neurological:     Mental Status: He is alert and oriented to person, place, and time.     Cranial Nerves: No cranial nerve deficit.     Sensory: No sensory deficit.  Psychiatric:        Speech: Speech normal.      ED Treatments / Results  Labs (  all labs ordered are listed, but only abnormal results are displayed) Labs Reviewed - No data to display  EKG None  Radiology No results found.  Procedures .Suture Removal Date/Time: 01/01/2019 10:44 PM Performed by: Lily Kocher, PA-C Authorized by: Lily Kocher, PA-C   Consent:    Consent obtained:  Verbal   Consent given by:  Guardian   Risks discussed:  Pain Location:    Location:  Head/neck   Head/neck location:  Scalp Procedure details:    Wound appearance:  No signs of infection   Number of staples removed:  3 Post-procedure details:    Post-removal:  No dressing applied   Patient tolerance of procedure:  Tolerated well, no immediate complications   (including critical care time)  Medications Ordered in ED Medications - No data to display   Initial Impression / Assessment and Plan / ED Course  I have reviewed the triage vital signs and the nursing notes.  Pertinent labs & imaging results that were available during my care of the patient were reviewed by me and considered in my medical decision making (see chart for details).       Final Clinical Impressions(s) / ED Diagnoses MDM Blood pressure is elevated at 172/81, otherwise vital signs are within normal limits.  Pulse oximetry is 100% on room air..  Within normal limits by my interpretation.  Sutures removed by me.  The wound is healing nicely, with good granulation tissue.  No signs of any advancing infection.  The patient and the caregiver are advised to return to the  emergency department if any changes in condition, problems, or concerns.  Patient and caregiver in agreement with this plan.   Final diagnoses:  Removal of staples    ED Discharge Orders    None       Lily Kocher, PA-C 01/01/19 2256    Milton Ferguson, MD 01/03/19 1358

## 2018-12-31 NOTE — Discharge Instructions (Addendum)
Your wound is well-healed.  Please cleanse the area with soap and water daily.  Please return if any signs of advancing infection.

## 2018-12-31 NOTE — Progress Notes (Signed)
Remote pacemaker transmission.   

## 2018-12-31 NOTE — ED Triage Notes (Signed)
Patient here for staple removal from his posterior scalp area.

## 2019-01-01 DIAGNOSIS — F0280 Dementia in other diseases classified elsewhere without behavioral disturbance: Secondary | ICD-10-CM | POA: Diagnosis not present

## 2019-01-01 DIAGNOSIS — I480 Paroxysmal atrial fibrillation: Secondary | ICD-10-CM | POA: Diagnosis not present

## 2019-01-01 DIAGNOSIS — N400 Enlarged prostate without lower urinary tract symptoms: Secondary | ICD-10-CM | POA: Diagnosis not present

## 2019-01-01 DIAGNOSIS — I10 Essential (primary) hypertension: Secondary | ICD-10-CM | POA: Diagnosis not present

## 2019-01-01 DIAGNOSIS — G309 Alzheimer's disease, unspecified: Secondary | ICD-10-CM | POA: Diagnosis not present

## 2019-01-01 DIAGNOSIS — I2510 Atherosclerotic heart disease of native coronary artery without angina pectoris: Secondary | ICD-10-CM | POA: Diagnosis not present

## 2019-01-01 LAB — CUP PACEART REMOTE DEVICE CHECK
Battery Remaining Longevity: 113 mo
Battery Remaining Percentage: 95.5 %
Battery Voltage: 2.98 V
Brady Statistic AP VP Percent: 46 %
Brady Statistic AP VS Percent: 2.2 %
Brady Statistic AS VP Percent: 45 %
Brady Statistic AS VS Percent: 2.2 %
Brady Statistic RA Percent Paced: 11 %
Brady Statistic RV Percent Paced: 87 %
Date Time Interrogation Session: 20200106082442
Implantable Lead Implant Date: 20150430
Implantable Lead Implant Date: 20150430
Implantable Lead Location: 753859
Implantable Lead Location: 753860
Implantable Pulse Generator Implant Date: 20150430
Lead Channel Impedance Value: 310 Ohm
Lead Channel Impedance Value: 550 Ohm
Lead Channel Pacing Threshold Amplitude: 0.5 V
Lead Channel Pacing Threshold Amplitude: 1 V
Lead Channel Pacing Threshold Pulse Width: 0.4 ms
Lead Channel Pacing Threshold Pulse Width: 0.4 ms
Lead Channel Sensing Intrinsic Amplitude: 0.5 mV
Lead Channel Sensing Intrinsic Amplitude: 12 mV
Lead Channel Setting Pacing Amplitude: 2 V
Lead Channel Setting Pacing Amplitude: 2.5 V
Lead Channel Setting Pacing Pulse Width: 0.4 ms
Lead Channel Setting Sensing Sensitivity: 2 mV
Pulse Gen Model: 2240
Pulse Gen Serial Number: 7618528

## 2019-01-02 DIAGNOSIS — I2510 Atherosclerotic heart disease of native coronary artery without angina pectoris: Secondary | ICD-10-CM | POA: Diagnosis not present

## 2019-01-02 DIAGNOSIS — I10 Essential (primary) hypertension: Secondary | ICD-10-CM | POA: Diagnosis not present

## 2019-01-02 DIAGNOSIS — N400 Enlarged prostate without lower urinary tract symptoms: Secondary | ICD-10-CM | POA: Diagnosis not present

## 2019-01-02 DIAGNOSIS — I480 Paroxysmal atrial fibrillation: Secondary | ICD-10-CM | POA: Diagnosis not present

## 2019-01-02 DIAGNOSIS — G309 Alzheimer's disease, unspecified: Secondary | ICD-10-CM | POA: Diagnosis not present

## 2019-01-02 DIAGNOSIS — F0280 Dementia in other diseases classified elsewhere without behavioral disturbance: Secondary | ICD-10-CM | POA: Diagnosis not present

## 2019-01-19 ENCOUNTER — Emergency Department (HOSPITAL_COMMUNITY): Payer: Medicare Other

## 2019-01-19 ENCOUNTER — Emergency Department (HOSPITAL_COMMUNITY)
Admission: EM | Admit: 2019-01-19 | Discharge: 2019-01-19 | Disposition: A | Discharge status: Home / Self Care | Payer: Medicare Other | Attending: Emergency Medicine | Admitting: Emergency Medicine

## 2019-01-19 ENCOUNTER — Encounter (HOSPITAL_COMMUNITY): Payer: Self-pay

## 2019-01-19 DIAGNOSIS — R42 Dizziness and giddiness: Secondary | ICD-10-CM | POA: Diagnosis not present

## 2019-01-19 DIAGNOSIS — W19XXXA Unspecified fall, initial encounter: Secondary | ICD-10-CM | POA: Insufficient documentation

## 2019-01-19 DIAGNOSIS — S3993XA Unspecified injury of pelvis, initial encounter: Secondary | ICD-10-CM | POA: Diagnosis not present

## 2019-01-19 DIAGNOSIS — Z7901 Long term (current) use of anticoagulants: Secondary | ICD-10-CM | POA: Diagnosis not present

## 2019-01-19 DIAGNOSIS — Z950 Presence of cardiac pacemaker: Secondary | ICD-10-CM | POA: Insufficient documentation

## 2019-01-19 DIAGNOSIS — Y929 Unspecified place or not applicable: Secondary | ICD-10-CM | POA: Insufficient documentation

## 2019-01-19 DIAGNOSIS — Z96652 Presence of left artificial knee joint: Secondary | ICD-10-CM | POA: Diagnosis not present

## 2019-01-19 DIAGNOSIS — S01111A Laceration without foreign body of right eyelid and periocular area, initial encounter: Secondary | ICD-10-CM | POA: Insufficient documentation

## 2019-01-19 DIAGNOSIS — Z79899 Other long term (current) drug therapy: Secondary | ICD-10-CM | POA: Insufficient documentation

## 2019-01-19 DIAGNOSIS — Y999 Unspecified external cause status: Secondary | ICD-10-CM | POA: Diagnosis not present

## 2019-01-19 DIAGNOSIS — I10 Essential (primary) hypertension: Secondary | ICD-10-CM | POA: Diagnosis not present

## 2019-01-19 DIAGNOSIS — S299XXA Unspecified injury of thorax, initial encounter: Secondary | ICD-10-CM | POA: Diagnosis not present

## 2019-01-19 DIAGNOSIS — Z955 Presence of coronary angioplasty implant and graft: Secondary | ICD-10-CM | POA: Diagnosis not present

## 2019-01-19 DIAGNOSIS — I2510 Atherosclerotic heart disease of native coronary artery without angina pectoris: Secondary | ICD-10-CM | POA: Insufficient documentation

## 2019-01-19 DIAGNOSIS — Y939 Activity, unspecified: Secondary | ICD-10-CM | POA: Diagnosis not present

## 2019-01-19 DIAGNOSIS — R58 Hemorrhage, not elsewhere classified: Secondary | ICD-10-CM | POA: Diagnosis not present

## 2019-01-19 DIAGNOSIS — S199XXA Unspecified injury of neck, initial encounter: Secondary | ICD-10-CM | POA: Diagnosis not present

## 2019-01-19 DIAGNOSIS — R51 Headache: Secondary | ICD-10-CM | POA: Diagnosis not present

## 2019-01-19 DIAGNOSIS — S0990XA Unspecified injury of head, initial encounter: Secondary | ICD-10-CM | POA: Diagnosis not present

## 2019-01-19 LAB — CBC WITH DIFFERENTIAL/PLATELET
Abs Immature Granulocytes: 0.01 10*3/uL (ref 0.00–0.07)
Basophils Absolute: 0 10*3/uL (ref 0.0–0.1)
Basophils Relative: 0 %
Eosinophils Absolute: 0.1 10*3/uL (ref 0.0–0.5)
Eosinophils Relative: 2 %
HCT: 39.6 % (ref 39.0–52.0)
Hemoglobin: 11.9 g/dL — ABNORMAL LOW (ref 13.0–17.0)
Immature Granulocytes: 0 %
Lymphocytes Relative: 21 %
Lymphs Abs: 1 10*3/uL (ref 0.7–4.0)
MCH: 26.3 pg (ref 26.0–34.0)
MCHC: 30.1 g/dL (ref 30.0–36.0)
MCV: 87.6 fL (ref 80.0–100.0)
Monocytes Absolute: 0.4 10*3/uL (ref 0.1–1.0)
Monocytes Relative: 8 %
Neutro Abs: 3.2 10*3/uL (ref 1.7–7.7)
Neutrophils Relative %: 69 %
Platelets: 148 10*3/uL — ABNORMAL LOW (ref 150–400)
RBC: 4.52 MIL/uL (ref 4.22–5.81)
RDW: 14.6 % (ref 11.5–15.5)
WBC: 4.7 10*3/uL (ref 4.0–10.5)
nRBC: 0 % (ref 0.0–0.2)

## 2019-01-19 LAB — COMPREHENSIVE METABOLIC PANEL WITH GFR
ALT: 17 U/L (ref 0–44)
AST: 22 U/L (ref 15–41)
Albumin: 4.1 g/dL (ref 3.5–5.0)
Alkaline Phosphatase: 61 U/L (ref 38–126)
Anion gap: 10 (ref 5–15)
BUN: 20 mg/dL (ref 8–23)
CO2: 29 mmol/L (ref 22–32)
Calcium: 9.5 mg/dL (ref 8.9–10.3)
Chloride: 102 mmol/L (ref 98–111)
Creatinine, Ser: 0.96 mg/dL (ref 0.61–1.24)
GFR calc Af Amer: >60 mL/min
GFR calc non Af Amer: >60 mL/min
Glucose, Bld: 94 mg/dL (ref 70–99)
Potassium: 3.4 mmol/L — ABNORMAL LOW (ref 3.5–5.1)
Sodium: 141 mmol/L (ref 135–145)
Total Bilirubin: 0.6 mg/dL (ref 0.3–1.2)
Total Protein: 7.3 g/dL (ref 6.5–8.1)

## 2019-01-19 LAB — I-STAT TROPONIN, ED: Troponin i, poc: 0.02 ng/mL (ref 0.00–0.08)

## 2019-01-19 LAB — PROTIME-INR
INR: 2.18
Prothrombin Time: 23.9 s — ABNORMAL HIGH (ref 11.4–15.2)

## 2019-01-19 MED ORDER — SODIUM CHLORIDE 0.9 % IV BOLUS
500.0000 mL | Freq: Once | INTRAVENOUS | Status: AC
  Administered 2019-01-19: 500 mL via INTRAVENOUS

## 2019-01-19 NOTE — Discharge Instructions (Addendum)
Clean laceration twice a day with soap and water and follow-up in 1 week to get staples removed

## 2019-01-19 NOTE — ED Notes (Signed)
3 staples placed by EDP Roderic Palau

## 2019-01-19 NOTE — ED Provider Notes (Signed)
[facility] EMERGENCY DEPARTMENT Provider Note   CSN: [id number] Arrival date & time: 01/19/19  1813     History   Chief Complaint Chief Complaint  Patient presents with  . Fall    HPI Karl Howell is a 85 y.o. male.  Patient was at home and fell and hit his head no loss of consciousness.  Patient has a history of dementia.  The history is provided by the patient and a relative. No language interpreter was used.  Fall  This is a new problem. The current episode started less than 1 hour ago. The problem occurs rarely. The problem has been resolved. Pertinent negatives include no chest pain. Nothing aggravates the symptoms. Nothing relieves the symptoms. He has tried nothing for the symptoms. The treatment provided no relief.    Past Medical History:  Diagnosis Date  . Arthritis   . Cancer ([place])    lung  . Coronary artery disease    ATHERECTOMY DONE IN [place].  [name] Dementia ([place])   . HTN (hypertension)   . Hyperlipidemia   . Paroxysmal atrial fibrillation ([facility])   . Symptomatic bradycardia    status post permanent transvenous pacemaker insertion by [name]    Patient Active Problem List   Diagnosis Date Noted  . Atrial fibrillation ([place]) 05/25/2015  . Pacemaker 08/24/2014  . Sinus pause 04/22/2014  . Atrioventricular block, complete ([place]) 04/22/2014  . Bradycardia 04/19/2014  . Dizziness 04/19/2014  . Coronary artery disease 10/29/2013  . Essential hypertension 10/29/2013  . Hyperlipidemia 10/29/2013  . HIP PAIN 05/11/2010  . SPINAL STENOSIS, LUMBAR 07/22/2008  . DEGENERATIVE JOINT DISEASE, KNEE 12/11/2007  . KNEE PAIN 12/11/2007    Past Surgical History:  Procedure Laterality Date  . CARDIAC CATHETERIZATION     1996    "cleaned out artery out"....no problems since  . CORONARY ANGIOPLASTY     1996  ??? in [place], [place]  . MULTIPLE TOOTH EXTRACTIONS    . PACEMAKER INSERTION  04-22-2014   STJ dual chamber pacemaker implanted for symptomatic sinus  pauses  . RENAL ARTERY DUPLEX  03/11/13   NORMAL.  [name] STRESS MYOCARDIAL PERFUSION STUDY  09/18/10   MILD TO MODERATE PERFUSION DEFECT IN THE BASAL INFERIOR AND MID INFERIOR REGION. THIS IS CONSISTANT WITH INFARCT/SCAR. NO ISCHEMIA.  . TONSILLECTOMY    . TOTAL KNEE ARTHROPLASTY  11/28/2011   Procedure: TOTAL KNEE ARTHROPLASTY;  Surgeon: [name], MD;  Location: [facility];  Service: Orthopedics;  Laterality: Left;  . TRANSTHORACIC ECHOCARDIOGRAM  03/11/13   EF 55% TO 60%. GRADE 1 DIASTOLIC DYSFUNCTION. VENTRICULAR SEPTUM: THICKNESS IS MILDLY INCREASED. SEPTAL MOTION SHOWED ABNORMAL FUNCTION AND DYSSYNERGY. MV: MILD REGURG.         Home Medications    Prior to Admission medications   Medication Sig Start Date End Date Taking? Authorizing Provider  amLODipine (NORVASC) 2.5 MG tablet TAKE 1 TABLET DAILY Patient taking differently: Take 2.5 mg by mouth daily.  06/28/17  Yes Baldwin Jamaica, PA-C  cholecalciferol (VITAMIN D) 1000 units tablet Take 1,000 Units by mouth daily.   Yes [provider]  Cyanocobalamin (B-12) 1000 MCG TABS  01/10/19  Yes [provider]  losartan (COZAAR) 100 MG tablet TAKE 1 TABLET DAILY Patient taking differently: Take 100 mg by mouth daily.  11/26/18  Yes [name], MD  potassium chloride 20 MEQ/15ML (10%) SOLN Take 20 mEq by mouth daily. Mixed in juice 01/10/19  Yes [provider]  thiamine 100 MG tablet Take 1 tablet by mouth daily.    Yes [provider]  XARELTO 20 MG TABS tablet TAKE 1 TABLET DAILY WITH SUPPER Patient taking differently: Take 20 mg by mouth daily.  05/27/18  Yes Minus Breeding, MD    Family History Family History  Problem Relation Age of Onset  . Heart attack Mother 67  . Stroke Father 47  . Stroke Sister 9  . Heart attack Brother   . Stroke Sister   . Stroke Sister   . Heart attack Brother     Social History Social History   Tobacco Use  . Smoking status: Never Smoker  . Smokeless  tobacco: Never Used  Substance Use Topics  . Alcohol use: Yes    Alcohol/week: 2.0 standard drinks    Types: 2 Cans of beer per week    Comment: occasionally  . Drug use: No     Allergies   Other; Meclizine; and Hydrocodone   Review of Systems Review of Systems  Unable to perform ROS: Dementia  Cardiovascular: Negative for chest pain.     Physical Exam Updated Vital Signs BP (!) 154/97   Pulse 74   Temp 98.9 F (37.2 C) (Oral)   Resp 17   Ht 5\' 11"  (1.803 m)   Wt 61.6 kg   SpO2 98%   BMI 18.94 kg/m   Physical Exam Vitals signs and nursing note reviewed.  Constitutional:      Appearance: He is well-developed.  HENT:     Head: Normocephalic.     Comments: 2 cm laceration right eyebrow    Nose: Nose normal.  Eyes:     General: No scleral icterus.    Conjunctiva/sclera: Conjunctivae normal.  Neck:     Musculoskeletal: Neck supple.     Thyroid: No thyromegaly.  Cardiovascular:     Rate and Rhythm: Normal rate and regular rhythm.     Heart sounds: No murmur. No friction rub. No gallop.   Pulmonary:     Breath sounds: No stridor. No wheezing or rales.  Chest:     Chest wall: No tenderness.  Abdominal:     General: There is no distension.     Tenderness: There is no abdominal tenderness. There is no rebound.  Musculoskeletal: Normal range of motion.  Lymphadenopathy:     Cervical: No cervical adenopathy.  Skin:    Findings: No erythema or rash.  Neurological:     Motor: No abnormal muscle tone.     Coordination: Coordination normal.     Comments: Oriented to person only.  He is back to his normal  Psychiatric:        Behavior: Behavior normal.      ED Treatments / Results  Labs (all labs ordered are listed, but only abnormal results are displayed) Labs Reviewed  CBC WITH DIFFERENTIAL/PLATELET - Abnormal; Notable for the following components:      Result Value   Hemoglobin 11.9 (*)    Platelets 148 (*)    All other components within normal  limits  COMPREHENSIVE METABOLIC PANEL - Abnormal; Notable for the following components:   Potassium 3.4 (*)    All other components within normal limits  PROTIME-INR - Abnormal; Notable for the following components:   Prothrombin Time 23.9 (*)    All other components within normal limits  I-STAT TROPONIN, ED    EKG None  Radiology Ct Head Wo Contrast  Result Date: 01/19/2019 CLINICAL DATA:  Unwitnessed fall EXAM: CT  HEAD WITHOUT CONTRAST CT CERVICAL SPINE WITHOUT CONTRAST TECHNIQUE: Multidetector CT imaging of the head and cervical spine was performed following the standard protocol without intravenous contrast. Multiplanar CT image reconstructions of the cervical spine were also generated. COMPARISON:  12/24/2018 FINDINGS: CT HEAD FINDINGS Brain: There is atrophy and chronic small vessel disease changes. No acute intracranial abnormality. Specifically, no hemorrhage, hydrocephalus, mass lesion, acute infarction, or significant intracranial injury. Vascular: No hyperdense vessel or unexpected calcification. Skull: No acute calvarial abnormality. Sinuses/Orbits: No acute findings Other: None CT CERVICAL SPINE FINDINGS Alignment: No subluxation Skull base and vertebrae: No acute fracture. No primary bone lesion or focal pathologic process. Soft tissues and spinal canal: No prevertebral fluid or swelling. No visible canal hematoma. Disc levels:  Advanced diffuse degenerative disc and facet disease. Upper chest: No acute findings Other: None IMPRESSION: Atrophy, chronic microvascular disease. No acute intracranial abnormality. Advanced degenerative disc and facet disease throughout the cervical spine. No acute bony abnormality. Electronically Signed   By: Rolm Baptise M.D.   On: 01/19/2019 19:27   Ct Cervical Spine Wo Contrast  Result Date: 01/19/2019 CLINICAL DATA:  Unwitnessed fall EXAM: CT HEAD WITHOUT CONTRAST CT CERVICAL SPINE WITHOUT CONTRAST TECHNIQUE: Multidetector CT imaging of the head and  cervical spine was performed following the standard protocol without intravenous contrast. Multiplanar CT image reconstructions of the cervical spine were also generated. COMPARISON:  12/24/2018 FINDINGS: CT HEAD FINDINGS Brain: There is atrophy and chronic small vessel disease changes. No acute intracranial abnormality. Specifically, no hemorrhage, hydrocephalus, mass lesion, acute infarction, or significant intracranial injury. Vascular: No hyperdense vessel or unexpected calcification. Skull: No acute calvarial abnormality. Sinuses/Orbits: No acute findings Other: None CT CERVICAL SPINE FINDINGS Alignment: No subluxation Skull base and vertebrae: No acute fracture. No primary bone lesion or focal pathologic process. Soft tissues and spinal canal: No prevertebral fluid or swelling. No visible canal hematoma. Disc levels:  Advanced diffuse degenerative disc and facet disease. Upper chest: No acute findings Other: None IMPRESSION: Atrophy, chronic microvascular disease. No acute intracranial abnormality. Advanced degenerative disc and facet disease throughout the cervical spine. No acute bony abnormality. Electronically Signed   By: Rolm Baptise M.D.   On: 01/19/2019 19:27   Dg Pelvis Portable  Result Date: 01/19/2019 CLINICAL DATA:  Dementia patient post unwitnessed fall today. EXAM: PORTABLE PELVIS 1-2 VIEWS COMPARISON:  Reformats from abdominal CT 03/18/2013 FINDINGS: Cortical margins of the bony pelvis are intact. No evidence of acute fracture. Age related degenerative change of both hips. Pubic symphysis and sacroiliac joints are congruent. Few scattered bone islands, unchanged from prior CT. IMPRESSION: No evidence of pelvic fracture. Electronically Signed   By: Keith Rake M.D.   On: 01/19/2019 19:09   Dg Chest Portable 1 View  Result Date: 01/19/2019 CLINICAL DATA:  85 year old male with a history of unwitnessed fall EXAM: PORTABLE CHEST 1 VIEW COMPARISON:  09/25/2018 FINDINGS: Cardiomediastinal  silhouette unchanged in size and contour. No evidence of central vascular congestion. No interlobular septal thickening. No pneumothorax. No pleural effusion. Similar appearance of coarsened interstitial markings Cardiac pacing device on left chest wall with 2 leads in place. No displaced fracture. No confluent airspace disease. IMPRESSION: Chronic lung changes without evidence of superimposed acute cardiopulmonary disease Electronically Signed   By: Corrie Mckusick D.O.   On: 01/19/2019 19:09    Procedures .Marland KitchenLaceration Repair Date/Time: 01/19/2019 8:02 PM Performed by: Milton Ferguson, MD Authorized by: Milton Ferguson, MD   Consent:    Consent obtained:  Verbal   Consent  given by:  Spouse   Risks discussed:  Infection   Alternatives discussed:  No treatment Anesthesia (see MAR for exact dosages):    Anesthesia method:  None Laceration details:    Location:  Scalp   Scalp location:  Frontal   Length (cm):  2   Depth (mm):  1 Repair type:    Repair type:  Simple Pre-procedure details:    Preparation:  Patient was prepped and draped in usual sterile fashion Treatment:    Area cleansed with:  Betadine Skin repair:    Repair method:  Staples Approximation:    Approximation:  Close Comments:     Patient has superficial laceration to right eyebrow.  Area was cleaned thoroughly with Betadine.  3 staples were used to close laceration.  Patient tolerated the procedure well   (including critical care time)  Medications Ordered in ED Medications  sodium chloride 0.9 % bolus 500 mL (0 mLs Intravenous Stopped 01/19/19 1958)     Initial Impression / Assessment and Plan / ED Course  I have reviewed the triage vital signs and the nursing notes.  Pertinent labs & imaging results that were available during my care of the patient were reviewed by me and considered in my medical decision making (see chart for details).     Patient with fall.  CT unremarkable labs unremarkable. Fall with 2 cm  laceration to forehead Final Clinical Impressions(s) / ED Diagnoses   Final diagnoses:  Fall, initial encounter    ED Discharge Orders    None       Milton Ferguson, MD 01/19/19 2004

## 2019-01-19 NOTE — ED Triage Notes (Signed)
Ems reports pt has dementia and wife found pt laying in the floor and had a laceration over r eye.  EMS reports pt's mental status has been decreasing enroute.  Reports pt is on blood thinner.  EMS says pt was ambulatory at baseline then started dragging left leg.  cbg 144, bp 160/83

## 2019-01-26 ENCOUNTER — Emergency Department (HOSPITAL_COMMUNITY)
Admission: EM | Admit: 2019-01-26 | Discharge: 2019-01-26 | Disposition: A | Discharge status: Home / Self Care | Payer: Medicare Other | Attending: Emergency Medicine | Admitting: Emergency Medicine

## 2019-01-26 ENCOUNTER — Other Ambulatory Visit: Payer: Self-pay

## 2019-01-26 ENCOUNTER — Encounter (HOSPITAL_COMMUNITY): Payer: Self-pay

## 2019-01-26 DIAGNOSIS — Z4802 Encounter for removal of sutures: Secondary | ICD-10-CM | POA: Diagnosis not present

## 2019-01-26 DIAGNOSIS — S01111D Laceration without foreign body of right eyelid and periocular area, subsequent encounter: Secondary | ICD-10-CM | POA: Diagnosis not present

## 2019-01-26 NOTE — ED Triage Notes (Signed)
PT here for staple removal above r eye.  Denies any complaints.

## 2019-01-26 NOTE — ED Triage Notes (Signed)
Chauncey Cruel, PA removed staples.

## 2019-01-26 NOTE — ED Provider Notes (Signed)
[facility] EMERGENCY DEPARTMENT Provider Note   CSN: [id number] Arrival date & time: 01/26/19  1016     History   Chief Complaint Chief Complaint  Patient presents with  . Suture / Staple Removal    HPI DRAE MITZEL is a 85 y.o. male.  85 y.o male with PMH of HTN, HLD, Afib presents to the ED for a staple removal. Patient had 3 staples placed on 01/19/2019 to the right eyebrow. Family reports no erythema, fever, redness or other infectious signs.   The history is provided by the spouse.  Suture / Staple Removal     Past Medical History:  Diagnosis Date  . Arthritis   . Cancer ([place])    lung  . Coronary artery disease    ATHERECTOMY DONE IN [place].  [name] Dementia ([place])   . HTN (hypertension)   . Hyperlipidemia   . Paroxysmal atrial fibrillation (HCC)   . Symptomatic bradycardia    status post permanent transvenous pacemaker insertion by [name]    Patient Active Problem List   Diagnosis Date Noted  . Atrial fibrillation ([place]) 05/25/2015  . Pacemaker 08/24/2014  . Sinus pause 04/22/2014  . Atrioventricular block, complete ([place]) 04/22/2014  . Bradycardia 04/19/2014  . Dizziness 04/19/2014  . Coronary artery disease 10/29/2013  . Essential hypertension 10/29/2013  . Hyperlipidemia 10/29/2013  . HIP PAIN 05/11/2010  . SPINAL STENOSIS, LUMBAR 07/22/2008  . DEGENERATIVE JOINT DISEASE, KNEE 12/11/2007  . KNEE PAIN 12/11/2007    Past Surgical History:  Procedure Laterality Date  . CARDIAC CATHETERIZATION     1996    "cleaned out artery out"....no problems since  . CORONARY ANGIOPLASTY     1996  ??? in [place], [place]  . MULTIPLE TOOTH EXTRACTIONS    . PACEMAKER INSERTION  04-22-2014   STJ dual chamber pacemaker implanted for symptomatic sinus pauses  . RENAL ARTERY DUPLEX  03/11/13   NORMAL.  [name] STRESS MYOCARDIAL PERFUSION STUDY  09/18/10   MILD TO MODERATE PERFUSION DEFECT IN THE BASAL INFERIOR AND MID INFERIOR REGION. THIS IS CONSISTANT WITH  INFARCT/SCAR. NO ISCHEMIA.  . TONSILLECTOMY    . TOTAL KNEE ARTHROPLASTY  11/28/2011   Procedure: TOTAL KNEE ARTHROPLASTY;  Surgeon: [name], MD;  Location: [place];  Service: Orthopedics;  Laterality: Left;  . TRANSTHORACIC ECHOCARDIOGRAM  03/11/13   EF 55% TO 60%. GRADE 1 DIASTOLIC DYSFUNCTION. VENTRICULAR SEPTUM: THICKNESS IS MILDLY INCREASED. SEPTAL MOTION SHOWED ABNORMAL FUNCTION AND DYSSYNERGY. MV: MILD REGURG.         Home Medications    Prior to Admission medications   Medication Sig Start Date End Date Taking? Authorizing Provider  amLODipine (NORVASC) 2.5 MG tablet TAKE 1 TABLET DAILY Patient taking differently: Take 2.5 mg by mouth daily.  06/28/17   Baldwin Jamaica, PA-C  cholecalciferol (VITAMIN D) 1000 units tablet Take 1,000 Units by mouth daily.    [provider]  Cyanocobalamin (B-12) 1000 MCG TABS  01/10/19   [provider]  losartan (COZAAR) 100 MG tablet TAKE 1 TABLET DAILY Patient taking differently: Take 100 mg by mouth daily.  11/26/18   [name], MD  potassium chloride 20 MEQ/15ML (10%) SOLN Take 20 mEq by mouth daily. Mixed in juice 01/10/19   [provider]  thiamine 100 MG tablet Take 1 tablet by mouth daily.     [provider]  XARELTO 20 MG TABS tablet TAKE 1 TABLET DAILY WITH SUPPER Patient taking differently: Take  20 mg by mouth daily.  05/27/18   Minus Breeding, MD    Family History Family History  Problem Relation Age of Onset  . Heart attack Mother 61  . Stroke Father 74  . Stroke Sister 77  . Heart attack Brother   . Stroke Sister   . Stroke Sister   . Heart attack Brother     Social History Social History   Tobacco Use  . Smoking status: Never Smoker  . Smokeless tobacco: Never Used  Substance Use Topics  . Alcohol use: Yes    Alcohol/week: 2.0 standard drinks    Types: 2 Cans of beer per week    Comment: occasionally  . Drug use: No     Allergies   Other; Meclizine; and  Hydrocodone   Review of Systems Review of Systems  Constitutional: Negative for fever.  Skin: Positive for wound.     Physical Exam Updated Vital Signs BP (!) 160/86 (BP Location: Right Arm)   Pulse 70   Temp 97.6 F (36.4 C) (Oral)   Resp 20   Ht 5\' 11"  (1.803 m)   Wt 61 kg   SpO2 100%   BMI 18.76 kg/m   Physical Exam Vitals signs and nursing note reviewed.  Constitutional:      Appearance: He is well-developed.  HENT:     Head: Normocephalic and atraumatic.  Eyes:     General: No scleral icterus.    Pupils: Pupils are equal, round, and reactive to light.  Neck:     Musculoskeletal: Normal range of motion.  Cardiovascular:     Heart sounds: Normal heart sounds.  Pulmonary:     Effort: Pulmonary effort is normal.  Abdominal:     General: Bowel sounds are normal. There is no distension.     Palpations: Abdomen is soft.     Tenderness: There is no abdominal tenderness.  Musculoskeletal:        General: No tenderness or deformity.  Skin:    General: Skin is warm and dry.     Findings: No erythema or lesion.       Neurological:     Mental Status: He is alert and oriented to person, place, and time.      ED Treatments / Results  Labs (all labs ordered are listed, but only abnormal results are displayed) Labs Reviewed - No data to display  EKG None  Radiology No results found.  Procedures Procedures (including critical care time)  Medications Ordered in ED Medications - No data to display   Initial Impression / Assessment and Plan / ED Course  I have reviewed the triage vital signs and the nursing notes.  Pertinent labs & imaging results that were available during my care of the patient were reviewed by me and considered in my medical decision making (see chart for details).     Patient presents for staple removal, no fever, erythema, swelling noted to the area, no changes in skin. I personally removed staples myself. No complaints per patient  or family at the bedside. Follow up with PCP as needed. Vitals stable for discharge, afebrile in [place].   Final Clinical Impressions(s) / ED Diagnoses   Final diagnoses:  Removal of staple    ED Discharge Orders    None       Janeece Fitting, Hershal Coria 01/26/19 1044    Virgel Manifold, MD 01/26/19 1549

## 2019-03-19 IMAGING — CT CT CERVICAL SPINE W/O CM
3 of 9 series · 12 of 34 positions shown, 14 images · non-contrast
Comparison: 12/24/2018

CLINICAL DATA: Unwitnessed fall

EXAM:
CT HEAD WITHOUT CONTRAST
CT CERVICAL SPINE WITHOUT CONTRAST
TECHNIQUE: Multidetector CT imaging of the head and cervical spine was
performed following the standard protocol without intravenous
contrast. Multiplanar CT image reconstructions of the cervical spine
were also generated.

[Series 6: coronal soft tissue · coronal · 0.31mm/px · 2 of 68 slices shown]
[im 23/68  bone]
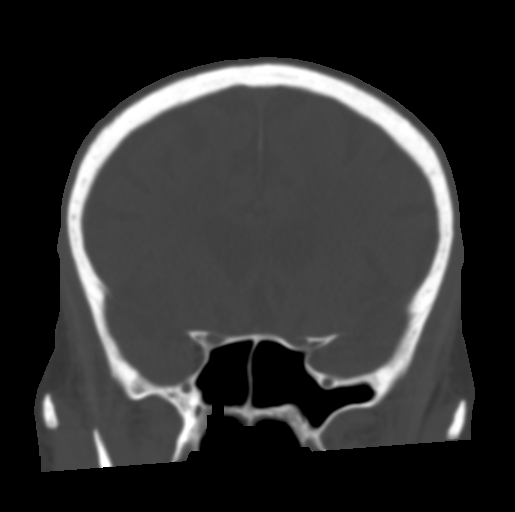
[im 45/68  bone]
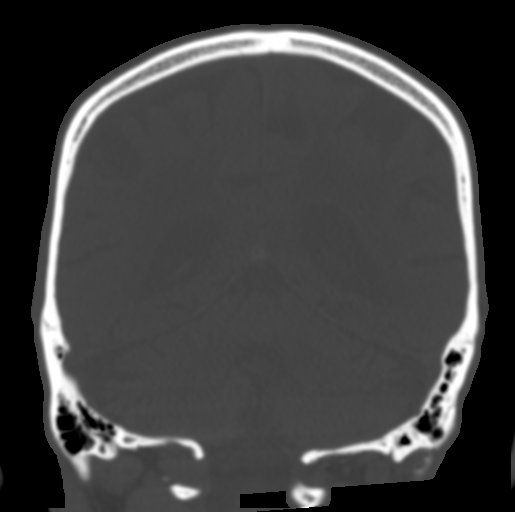

[Series 9: c spine soft · axial · 0.38mm/px · z∈[+1554,+1670]mm · 5 of 82 slices shown, 7 images]
[im 12/82  soft-tissue]
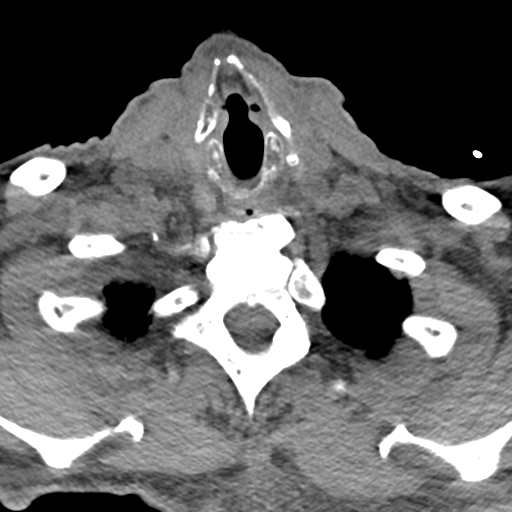
[im 12/82  bone]
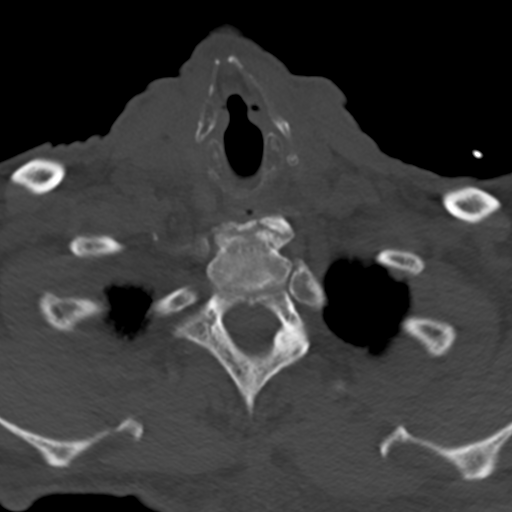
[im 24/82  bone]
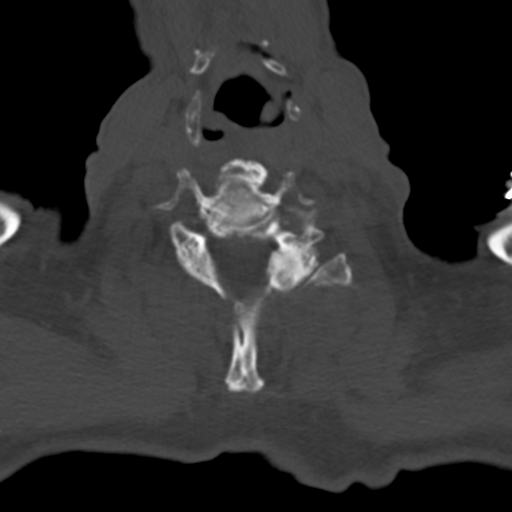
[im 47/82  bone]
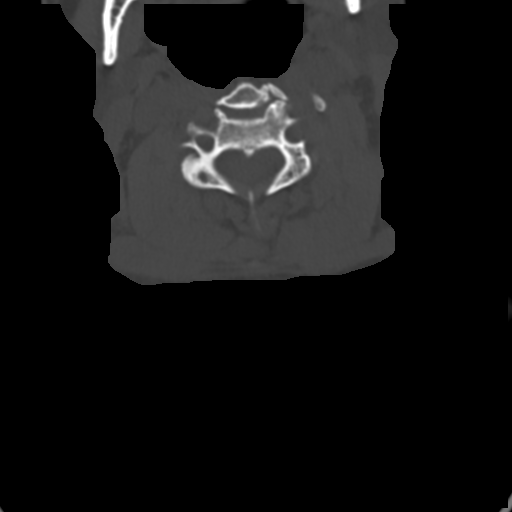
[im 58/82  bone]
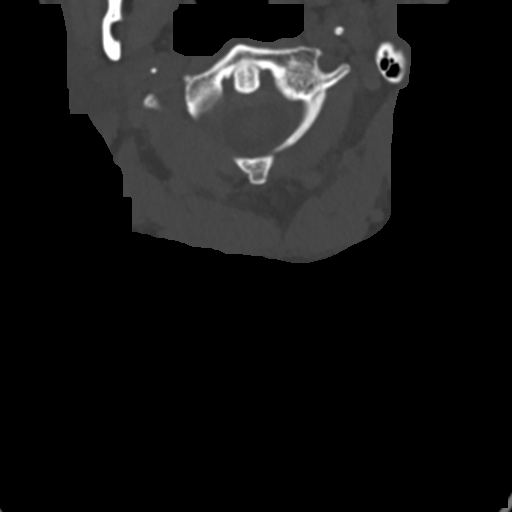
[im 70/82  soft-tissue]
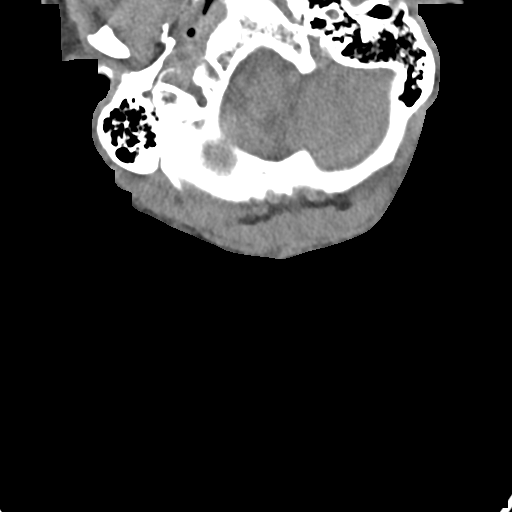
[im 70/82  bone]
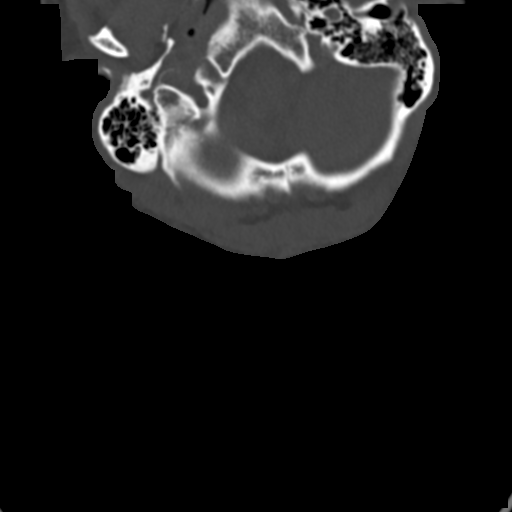

[Series 17: sagittal bone · sagittal · 0.13mm/px · 5 of 91 slices shown]
[im 16/91  bone]
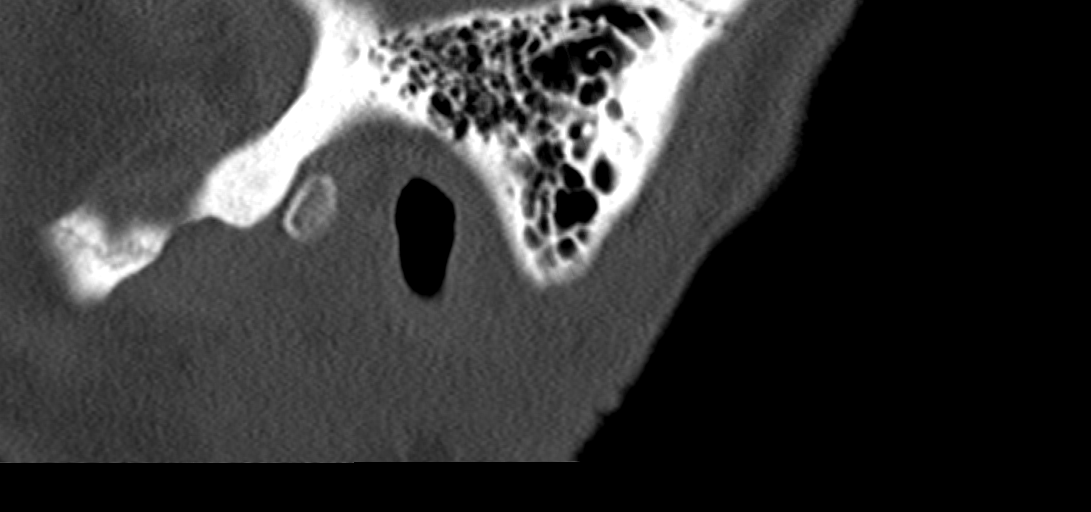
[im 31/91  bone]
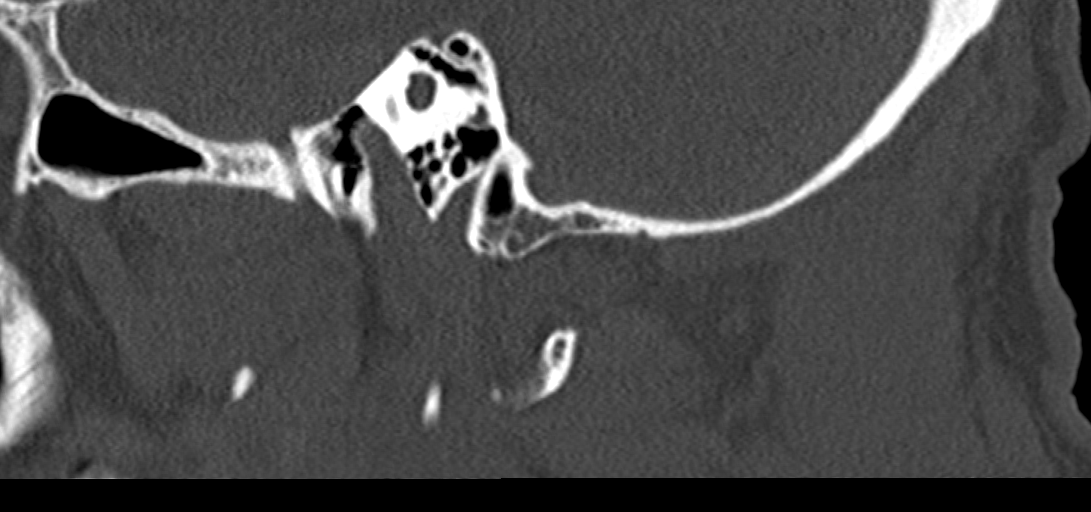
[im 46/91  bone]
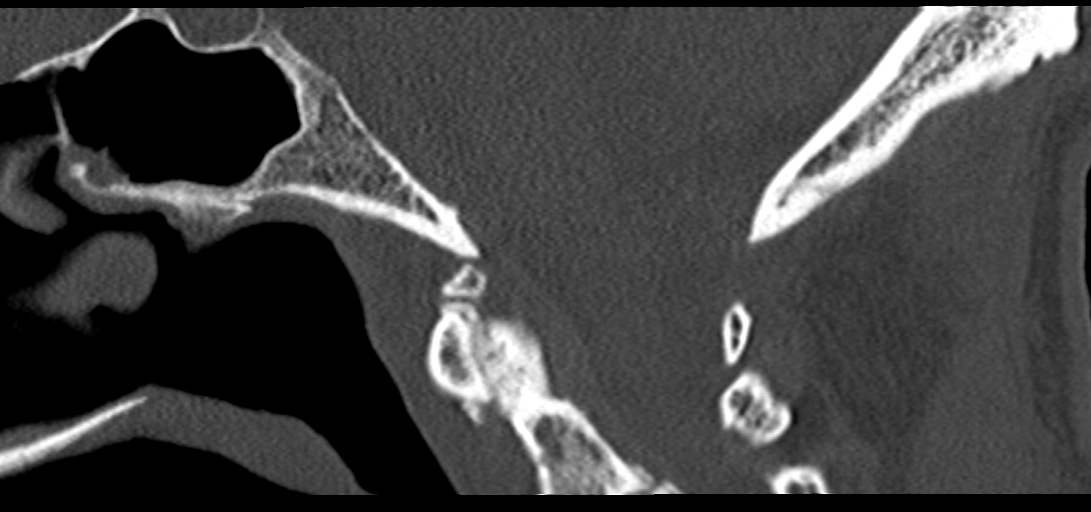
[im 61/91  bone]
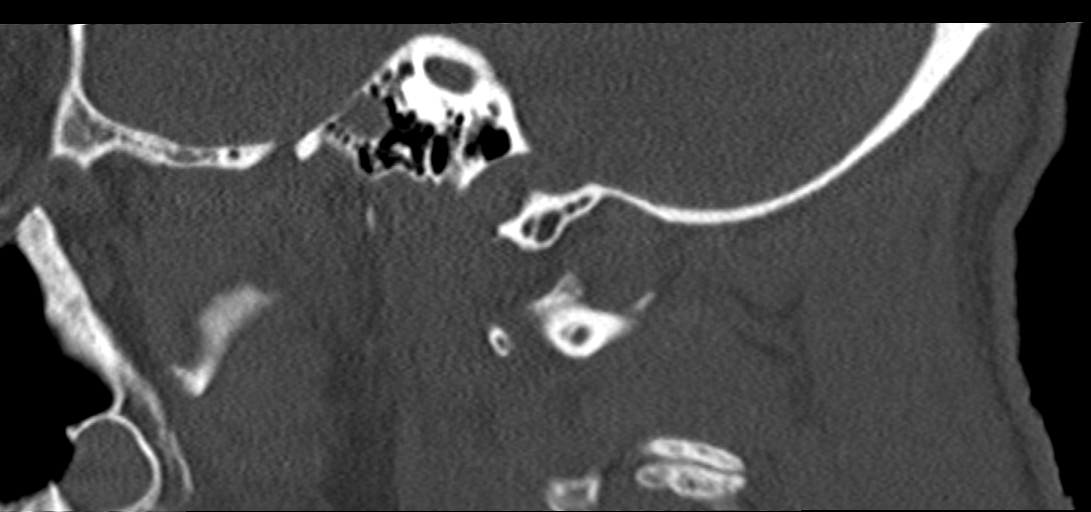
[im 76/91  bone]
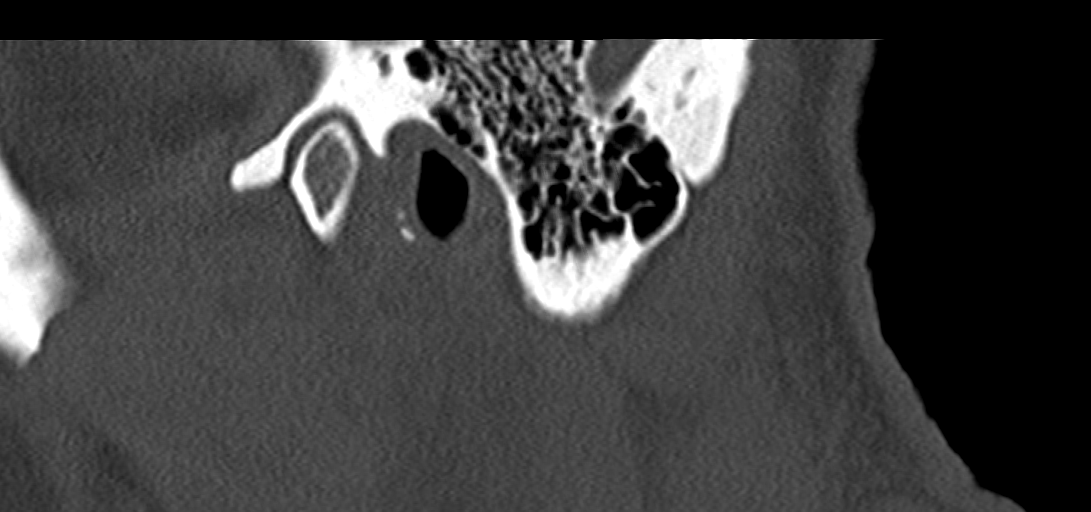

[12 of 34 positions shown; findings below may reference images not displayed]

FINDINGS: CT HEAD FINDINGS

Brain: There is atrophy and chronic small vessel disease changes. No
acute intracranial abnormality. Specifically, no hemorrhage,
hydrocephalus, mass lesion, acute infarction, or significant
intracranial injury.

Vascular: No hyperdense vessel or unexpected calcification.

Skull: No acute calvarial abnormality.

Sinuses/Orbits: No acute findings

Other: None

CT CERVICAL SPINE FINDINGS

Alignment: No subluxation

Skull base and vertebrae: No acute fracture. No primary bone lesion
or focal pathologic process.

Soft tissues and spinal canal: No prevertebral fluid or swelling. No
visible canal hematoma.

Disc levels:  Advanced diffuse degenerative disc and facet disease.

Upper chest: No acute findings

Other: None
IMPRESSION: Atrophy, chronic microvascular disease.

No acute intracranial abnormality.

Advanced degenerative disc and facet disease throughout the cervical
spine. No acute bony abnormality.

## 2019-03-31 ENCOUNTER — Ambulatory Visit (INDEPENDENT_AMBULATORY_CARE_PROVIDER_SITE_OTHER): Payer: Medicare Other | Admitting: *Deleted

## 2019-03-31 ENCOUNTER — Other Ambulatory Visit: Payer: Self-pay

## 2019-03-31 DIAGNOSIS — I442 Atrioventricular block, complete: Secondary | ICD-10-CM

## 2019-03-31 DIAGNOSIS — I495 Sick sinus syndrome: Secondary | ICD-10-CM

## 2019-04-01 LAB — CUP PACEART REMOTE DEVICE CHECK
Battery Remaining Longevity: 114 mo
Battery Remaining Percentage: 95.5 %
Battery Voltage: 2.98 V
Brady Statistic AP VP Percent: 47 %
Brady Statistic AP VS Percent: 1.9 %
Brady Statistic AS VP Percent: 45 %
Brady Statistic AS VS Percent: 2 %
Brady Statistic RA Percent Paced: 13 %
Brady Statistic RV Percent Paced: 86 %
Date Time Interrogation Session: 20200407060021
Implantable Lead Implant Date: 20150430
Implantable Lead Implant Date: 20150430
Implantable Lead Location: 753859
Implantable Lead Location: 753860
Implantable Pulse Generator Implant Date: 20150430
Lead Channel Impedance Value: 350 Ohm
Lead Channel Impedance Value: 560 Ohm
Lead Channel Pacing Threshold Amplitude: 0.5 V
Lead Channel Pacing Threshold Amplitude: 1 V
Lead Channel Pacing Threshold Pulse Width: 0.4 ms
Lead Channel Pacing Threshold Pulse Width: 0.4 ms
Lead Channel Sensing Intrinsic Amplitude: 0.6 mV
Lead Channel Sensing Intrinsic Amplitude: 12 mV
Lead Channel Setting Pacing Amplitude: 2 V
Lead Channel Setting Pacing Amplitude: 2.5 V
Lead Channel Setting Pacing Pulse Width: 0.4 ms
Lead Channel Setting Sensing Sensitivity: 2 mV
Pulse Gen Model: 2240
Pulse Gen Serial Number: 7618528

## 2019-04-09 NOTE — Progress Notes (Signed)
Remote pacemaker transmission.   

## 2019-04-10 ENCOUNTER — Telehealth: Payer: Self-pay | Admitting: Internal Medicine

## 2019-04-10 NOTE — Telephone Encounter (Signed)
New message   [facility] for pt to call back about appt on 04.22.20 with [name]. Will offer doxemity video if pt has a smart phone. Will also change appt time.

## 2019-04-13 NOTE — Telephone Encounter (Signed)
F/U Message          Patient returned the call would like a call back.

## 2019-04-14 NOTE — Telephone Encounter (Signed)
Follow up   Called patient this morning about appt scheduled for 04.22.20 with [name]. Pt wife said they have a landline and pt is not getting around well and doesn't talk on the phone. Pt rescheduled for appt in Aug.

## 2019-04-15 ENCOUNTER — Telehealth: Payer: Medicare Other | Admitting: Internal Medicine

## 2019-05-26 DIAGNOSIS — F0280 Dementia in other diseases classified elsewhere without behavioral disturbance: Secondary | ICD-10-CM | POA: Diagnosis not present

## 2019-05-26 DIAGNOSIS — Z7901 Long term (current) use of anticoagulants: Secondary | ICD-10-CM | POA: Diagnosis not present

## 2019-05-26 DIAGNOSIS — I2510 Atherosclerotic heart disease of native coronary artery without angina pectoris: Secondary | ICD-10-CM | POA: Diagnosis not present

## 2019-05-26 DIAGNOSIS — E785 Hyperlipidemia, unspecified: Secondary | ICD-10-CM | POA: Diagnosis not present

## 2019-05-26 DIAGNOSIS — Z85118 Personal history of other malignant neoplasm of bronchus and lung: Secondary | ICD-10-CM | POA: Diagnosis not present

## 2019-05-26 DIAGNOSIS — H8110 Benign paroxysmal vertigo, unspecified ear: Secondary | ICD-10-CM | POA: Diagnosis not present

## 2019-05-26 DIAGNOSIS — F329 Major depressive disorder, single episode, unspecified: Secondary | ICD-10-CM | POA: Diagnosis not present

## 2019-05-26 DIAGNOSIS — Z8673 Personal history of transient ischemic attack (TIA), and cerebral infarction without residual deficits: Secondary | ICD-10-CM | POA: Diagnosis not present

## 2019-05-26 DIAGNOSIS — Z9002 Acquired absence of larynx: Secondary | ICD-10-CM | POA: Diagnosis not present

## 2019-05-26 DIAGNOSIS — I495 Sick sinus syndrome: Secondary | ICD-10-CM | POA: Diagnosis not present

## 2019-05-26 DIAGNOSIS — I10 Essential (primary) hypertension: Secondary | ICD-10-CM | POA: Diagnosis not present

## 2019-05-26 DIAGNOSIS — N400 Enlarged prostate without lower urinary tract symptoms: Secondary | ICD-10-CM | POA: Diagnosis not present

## 2019-05-26 DIAGNOSIS — E559 Vitamin D deficiency, unspecified: Secondary | ICD-10-CM | POA: Diagnosis not present

## 2019-05-26 DIAGNOSIS — Z950 Presence of cardiac pacemaker: Secondary | ICD-10-CM | POA: Diagnosis not present

## 2019-05-26 DIAGNOSIS — Z9181 History of falling: Secondary | ICD-10-CM | POA: Diagnosis not present

## 2019-05-26 DIAGNOSIS — G309 Alzheimer's disease, unspecified: Secondary | ICD-10-CM | POA: Diagnosis not present

## 2019-05-26 DIAGNOSIS — I480 Paroxysmal atrial fibrillation: Secondary | ICD-10-CM | POA: Diagnosis not present

## 2019-05-29 DIAGNOSIS — G309 Alzheimer's disease, unspecified: Secondary | ICD-10-CM | POA: Diagnosis not present

## 2019-05-29 DIAGNOSIS — E785 Hyperlipidemia, unspecified: Secondary | ICD-10-CM | POA: Diagnosis not present

## 2019-05-29 DIAGNOSIS — F0280 Dementia in other diseases classified elsewhere without behavioral disturbance: Secondary | ICD-10-CM | POA: Diagnosis not present

## 2019-05-29 DIAGNOSIS — I480 Paroxysmal atrial fibrillation: Secondary | ICD-10-CM | POA: Diagnosis not present

## 2019-05-29 DIAGNOSIS — I10 Essential (primary) hypertension: Secondary | ICD-10-CM | POA: Diagnosis not present

## 2019-05-29 DIAGNOSIS — I2510 Atherosclerotic heart disease of native coronary artery without angina pectoris: Secondary | ICD-10-CM | POA: Diagnosis not present

## 2019-06-02 DIAGNOSIS — I2510 Atherosclerotic heart disease of native coronary artery without angina pectoris: Secondary | ICD-10-CM | POA: Diagnosis not present

## 2019-06-02 DIAGNOSIS — I480 Paroxysmal atrial fibrillation: Secondary | ICD-10-CM | POA: Diagnosis not present

## 2019-06-02 DIAGNOSIS — F0280 Dementia in other diseases classified elsewhere without behavioral disturbance: Secondary | ICD-10-CM | POA: Diagnosis not present

## 2019-06-02 DIAGNOSIS — I10 Essential (primary) hypertension: Secondary | ICD-10-CM | POA: Diagnosis not present

## 2019-06-02 DIAGNOSIS — E785 Hyperlipidemia, unspecified: Secondary | ICD-10-CM | POA: Diagnosis not present

## 2019-06-02 DIAGNOSIS — G309 Alzheimer's disease, unspecified: Secondary | ICD-10-CM | POA: Diagnosis not present

## 2019-06-04 DIAGNOSIS — I480 Paroxysmal atrial fibrillation: Secondary | ICD-10-CM | POA: Diagnosis not present

## 2019-06-04 DIAGNOSIS — I10 Essential (primary) hypertension: Secondary | ICD-10-CM | POA: Diagnosis not present

## 2019-06-04 DIAGNOSIS — F0280 Dementia in other diseases classified elsewhere without behavioral disturbance: Secondary | ICD-10-CM | POA: Diagnosis not present

## 2019-06-04 DIAGNOSIS — E785 Hyperlipidemia, unspecified: Secondary | ICD-10-CM | POA: Diagnosis not present

## 2019-06-04 DIAGNOSIS — G309 Alzheimer's disease, unspecified: Secondary | ICD-10-CM | POA: Diagnosis not present

## 2019-06-04 DIAGNOSIS — I2510 Atherosclerotic heart disease of native coronary artery without angina pectoris: Secondary | ICD-10-CM | POA: Diagnosis not present

## 2019-06-09 DIAGNOSIS — E785 Hyperlipidemia, unspecified: Secondary | ICD-10-CM | POA: Diagnosis not present

## 2019-06-09 DIAGNOSIS — F0280 Dementia in other diseases classified elsewhere without behavioral disturbance: Secondary | ICD-10-CM | POA: Diagnosis not present

## 2019-06-09 DIAGNOSIS — I480 Paroxysmal atrial fibrillation: Secondary | ICD-10-CM | POA: Diagnosis not present

## 2019-06-09 DIAGNOSIS — I2510 Atherosclerotic heart disease of native coronary artery without angina pectoris: Secondary | ICD-10-CM | POA: Diagnosis not present

## 2019-06-09 DIAGNOSIS — I10 Essential (primary) hypertension: Secondary | ICD-10-CM | POA: Diagnosis not present

## 2019-06-09 DIAGNOSIS — G309 Alzheimer's disease, unspecified: Secondary | ICD-10-CM | POA: Diagnosis not present

## 2019-06-11 DIAGNOSIS — I10 Essential (primary) hypertension: Secondary | ICD-10-CM | POA: Diagnosis not present

## 2019-06-11 DIAGNOSIS — I480 Paroxysmal atrial fibrillation: Secondary | ICD-10-CM | POA: Diagnosis not present

## 2019-06-11 DIAGNOSIS — E785 Hyperlipidemia, unspecified: Secondary | ICD-10-CM | POA: Diagnosis not present

## 2019-06-11 DIAGNOSIS — I2510 Atherosclerotic heart disease of native coronary artery without angina pectoris: Secondary | ICD-10-CM | POA: Diagnosis not present

## 2019-06-11 DIAGNOSIS — F0280 Dementia in other diseases classified elsewhere without behavioral disturbance: Secondary | ICD-10-CM | POA: Diagnosis not present

## 2019-06-11 DIAGNOSIS — G309 Alzheimer's disease, unspecified: Secondary | ICD-10-CM | POA: Diagnosis not present

## 2019-06-14 DIAGNOSIS — R030 Elevated blood-pressure reading, without diagnosis of hypertension: Secondary | ICD-10-CM | POA: Diagnosis not present

## 2019-06-14 DIAGNOSIS — C3490 Malignant neoplasm of unspecified part of unspecified bronchus or lung: Secondary | ICD-10-CM | POA: Diagnosis not present

## 2019-06-14 DIAGNOSIS — I2510 Atherosclerotic heart disease of native coronary artery without angina pectoris: Secondary | ICD-10-CM | POA: Diagnosis not present

## 2019-06-14 DIAGNOSIS — F0390 Unspecified dementia without behavioral disturbance: Secondary | ICD-10-CM | POA: Diagnosis not present

## 2019-06-15 DIAGNOSIS — I2510 Atherosclerotic heart disease of native coronary artery without angina pectoris: Secondary | ICD-10-CM | POA: Diagnosis not present

## 2019-06-15 DIAGNOSIS — R030 Elevated blood-pressure reading, without diagnosis of hypertension: Secondary | ICD-10-CM | POA: Diagnosis not present

## 2019-06-15 DIAGNOSIS — F0390 Unspecified dementia without behavioral disturbance: Secondary | ICD-10-CM | POA: Diagnosis not present

## 2019-06-15 DIAGNOSIS — C3490 Malignant neoplasm of unspecified part of unspecified bronchus or lung: Secondary | ICD-10-CM | POA: Diagnosis not present

## 2019-06-19 DIAGNOSIS — C3490 Malignant neoplasm of unspecified part of unspecified bronchus or lung: Secondary | ICD-10-CM | POA: Diagnosis not present

## 2019-06-19 DIAGNOSIS — I2510 Atherosclerotic heart disease of native coronary artery without angina pectoris: Secondary | ICD-10-CM | POA: Diagnosis not present

## 2019-06-19 DIAGNOSIS — R030 Elevated blood-pressure reading, without diagnosis of hypertension: Secondary | ICD-10-CM | POA: Diagnosis not present

## 2019-06-19 DIAGNOSIS — F0390 Unspecified dementia without behavioral disturbance: Secondary | ICD-10-CM | POA: Diagnosis not present

## 2019-06-22 DIAGNOSIS — C3490 Malignant neoplasm of unspecified part of unspecified bronchus or lung: Secondary | ICD-10-CM | POA: Diagnosis not present

## 2019-06-22 DIAGNOSIS — I2510 Atherosclerotic heart disease of native coronary artery without angina pectoris: Secondary | ICD-10-CM | POA: Diagnosis not present

## 2019-06-22 DIAGNOSIS — F0390 Unspecified dementia without behavioral disturbance: Secondary | ICD-10-CM | POA: Diagnosis not present

## 2019-06-22 DIAGNOSIS — R030 Elevated blood-pressure reading, without diagnosis of hypertension: Secondary | ICD-10-CM | POA: Diagnosis not present

## 2019-06-24 DIAGNOSIS — I2510 Atherosclerotic heart disease of native coronary artery without angina pectoris: Secondary | ICD-10-CM | POA: Diagnosis not present

## 2019-06-24 DIAGNOSIS — C3490 Malignant neoplasm of unspecified part of unspecified bronchus or lung: Secondary | ICD-10-CM | POA: Diagnosis not present

## 2019-06-24 DIAGNOSIS — F0390 Unspecified dementia without behavioral disturbance: Secondary | ICD-10-CM | POA: Diagnosis not present

## 2019-06-24 DIAGNOSIS — R030 Elevated blood-pressure reading, without diagnosis of hypertension: Secondary | ICD-10-CM | POA: Diagnosis not present

## 2019-06-26 DIAGNOSIS — C3490 Malignant neoplasm of unspecified part of unspecified bronchus or lung: Secondary | ICD-10-CM | POA: Diagnosis not present

## 2019-06-26 DIAGNOSIS — F0390 Unspecified dementia without behavioral disturbance: Secondary | ICD-10-CM | POA: Diagnosis not present

## 2019-06-26 DIAGNOSIS — R030 Elevated blood-pressure reading, without diagnosis of hypertension: Secondary | ICD-10-CM | POA: Diagnosis not present

## 2019-06-26 DIAGNOSIS — I2510 Atherosclerotic heart disease of native coronary artery without angina pectoris: Secondary | ICD-10-CM | POA: Diagnosis not present

## 2019-06-30 ENCOUNTER — Ambulatory Visit (INDEPENDENT_AMBULATORY_CARE_PROVIDER_SITE_OTHER): Admitting: *Deleted

## 2019-06-30 DIAGNOSIS — I2510 Atherosclerotic heart disease of native coronary artery without angina pectoris: Secondary | ICD-10-CM | POA: Diagnosis not present

## 2019-06-30 DIAGNOSIS — C3490 Malignant neoplasm of unspecified part of unspecified bronchus or lung: Secondary | ICD-10-CM | POA: Diagnosis not present

## 2019-06-30 DIAGNOSIS — F0390 Unspecified dementia without behavioral disturbance: Secondary | ICD-10-CM | POA: Diagnosis not present

## 2019-06-30 DIAGNOSIS — I442 Atrioventricular block, complete: Secondary | ICD-10-CM | POA: Diagnosis not present

## 2019-06-30 DIAGNOSIS — R030 Elevated blood-pressure reading, without diagnosis of hypertension: Secondary | ICD-10-CM | POA: Diagnosis not present

## 2019-06-30 LAB — CUP PACEART REMOTE DEVICE CHECK
Battery Remaining Longevity: 107 mo
Battery Remaining Percentage: 95.5 %
Battery Voltage: 2.96 V
Brady Statistic AP VP Percent: 49 %
Brady Statistic AP VS Percent: 2.7 %
Brady Statistic AS VP Percent: 41 %
Brady Statistic AS VS Percent: 2 %
Brady Statistic RA Percent Paced: 15 %
Brady Statistic RV Percent Paced: 86 %
Date Time Interrogation Session: 20200707060027
Implantable Lead Implant Date: 20150430
Implantable Lead Implant Date: 20150430
Implantable Lead Location: 753859
Implantable Lead Location: 753860
Implantable Pulse Generator Implant Date: 20150430
Lead Channel Impedance Value: 340 Ohm
Lead Channel Impedance Value: 550 Ohm
Lead Channel Pacing Threshold Amplitude: 0.5 V
Lead Channel Pacing Threshold Amplitude: 1 V
Lead Channel Pacing Threshold Pulse Width: 0.4 ms
Lead Channel Pacing Threshold Pulse Width: 0.4 ms
Lead Channel Sensing Intrinsic Amplitude: 0.5 mV
Lead Channel Sensing Intrinsic Amplitude: 12 mV
Lead Channel Setting Pacing Amplitude: 2 V
Lead Channel Setting Pacing Amplitude: 2.5 V
Lead Channel Setting Pacing Pulse Width: 0.4 ms
Lead Channel Setting Sensing Sensitivity: 2 mV
Pulse Gen Model: 2240
Pulse Gen Serial Number: 7618528

## 2019-07-01 DIAGNOSIS — I2510 Atherosclerotic heart disease of native coronary artery without angina pectoris: Secondary | ICD-10-CM | POA: Diagnosis not present

## 2019-07-01 DIAGNOSIS — C3490 Malignant neoplasm of unspecified part of unspecified bronchus or lung: Secondary | ICD-10-CM | POA: Diagnosis not present

## 2019-07-01 DIAGNOSIS — R030 Elevated blood-pressure reading, without diagnosis of hypertension: Secondary | ICD-10-CM | POA: Diagnosis not present

## 2019-07-01 DIAGNOSIS — F0390 Unspecified dementia without behavioral disturbance: Secondary | ICD-10-CM | POA: Diagnosis not present

## 2019-07-03 DIAGNOSIS — C3490 Malignant neoplasm of unspecified part of unspecified bronchus or lung: Secondary | ICD-10-CM | POA: Diagnosis not present

## 2019-07-03 DIAGNOSIS — F0390 Unspecified dementia without behavioral disturbance: Secondary | ICD-10-CM | POA: Diagnosis not present

## 2019-07-03 DIAGNOSIS — I2510 Atherosclerotic heart disease of native coronary artery without angina pectoris: Secondary | ICD-10-CM | POA: Diagnosis not present

## 2019-07-03 DIAGNOSIS — R030 Elevated blood-pressure reading, without diagnosis of hypertension: Secondary | ICD-10-CM | POA: Diagnosis not present

## 2019-07-06 DIAGNOSIS — F0390 Unspecified dementia without behavioral disturbance: Secondary | ICD-10-CM | POA: Diagnosis not present

## 2019-07-06 DIAGNOSIS — I2510 Atherosclerotic heart disease of native coronary artery without angina pectoris: Secondary | ICD-10-CM | POA: Diagnosis not present

## 2019-07-06 DIAGNOSIS — R030 Elevated blood-pressure reading, without diagnosis of hypertension: Secondary | ICD-10-CM | POA: Diagnosis not present

## 2019-07-06 DIAGNOSIS — C3490 Malignant neoplasm of unspecified part of unspecified bronchus or lung: Secondary | ICD-10-CM | POA: Diagnosis not present

## 2019-07-07 DIAGNOSIS — F0390 Unspecified dementia without behavioral disturbance: Secondary | ICD-10-CM | POA: Diagnosis not present

## 2019-07-07 DIAGNOSIS — I2510 Atherosclerotic heart disease of native coronary artery without angina pectoris: Secondary | ICD-10-CM | POA: Diagnosis not present

## 2019-07-07 DIAGNOSIS — R030 Elevated blood-pressure reading, without diagnosis of hypertension: Secondary | ICD-10-CM | POA: Diagnosis not present

## 2019-07-07 DIAGNOSIS — C3490 Malignant neoplasm of unspecified part of unspecified bronchus or lung: Secondary | ICD-10-CM | POA: Diagnosis not present

## 2019-07-12 ENCOUNTER — Encounter: Payer: Self-pay | Admitting: Cardiology

## 2019-07-12 NOTE — Progress Notes (Signed)
Remote pacemaker transmission.   

## 2019-07-16 DIAGNOSIS — I2510 Atherosclerotic heart disease of native coronary artery without angina pectoris: Secondary | ICD-10-CM | POA: Diagnosis not present

## 2019-07-16 DIAGNOSIS — C3490 Malignant neoplasm of unspecified part of unspecified bronchus or lung: Secondary | ICD-10-CM | POA: Diagnosis not present

## 2019-07-16 DIAGNOSIS — R030 Elevated blood-pressure reading, without diagnosis of hypertension: Secondary | ICD-10-CM | POA: Diagnosis not present

## 2019-07-16 DIAGNOSIS — F0390 Unspecified dementia without behavioral disturbance: Secondary | ICD-10-CM | POA: Diagnosis not present

## 2019-07-20 DIAGNOSIS — F0390 Unspecified dementia without behavioral disturbance: Secondary | ICD-10-CM | POA: Diagnosis not present

## 2019-07-20 DIAGNOSIS — I2510 Atherosclerotic heart disease of native coronary artery without angina pectoris: Secondary | ICD-10-CM | POA: Diagnosis not present

## 2019-07-20 DIAGNOSIS — R030 Elevated blood-pressure reading, without diagnosis of hypertension: Secondary | ICD-10-CM | POA: Diagnosis not present

## 2019-07-20 DIAGNOSIS — C3490 Malignant neoplasm of unspecified part of unspecified bronchus or lung: Secondary | ICD-10-CM | POA: Diagnosis not present

## 2019-07-23 DIAGNOSIS — F0390 Unspecified dementia without behavioral disturbance: Secondary | ICD-10-CM | POA: Diagnosis not present

## 2019-07-23 DIAGNOSIS — R030 Elevated blood-pressure reading, without diagnosis of hypertension: Secondary | ICD-10-CM | POA: Diagnosis not present

## 2019-07-23 DIAGNOSIS — C3490 Malignant neoplasm of unspecified part of unspecified bronchus or lung: Secondary | ICD-10-CM | POA: Diagnosis not present

## 2019-07-23 DIAGNOSIS — I2510 Atherosclerotic heart disease of native coronary artery without angina pectoris: Secondary | ICD-10-CM | POA: Diagnosis not present

## 2019-07-25 DIAGNOSIS — R030 Elevated blood-pressure reading, without diagnosis of hypertension: Secondary | ICD-10-CM | POA: Diagnosis not present

## 2019-07-25 DIAGNOSIS — I2510 Atherosclerotic heart disease of native coronary artery without angina pectoris: Secondary | ICD-10-CM | POA: Diagnosis not present

## 2019-07-25 DIAGNOSIS — F0390 Unspecified dementia without behavioral disturbance: Secondary | ICD-10-CM | POA: Diagnosis not present

## 2019-07-25 DIAGNOSIS — C3490 Malignant neoplasm of unspecified part of unspecified bronchus or lung: Secondary | ICD-10-CM | POA: Diagnosis not present

## 2019-07-28 DIAGNOSIS — R030 Elevated blood-pressure reading, without diagnosis of hypertension: Secondary | ICD-10-CM | POA: Diagnosis not present

## 2019-07-28 DIAGNOSIS — C3490 Malignant neoplasm of unspecified part of unspecified bronchus or lung: Secondary | ICD-10-CM | POA: Diagnosis not present

## 2019-07-28 DIAGNOSIS — I2510 Atherosclerotic heart disease of native coronary artery without angina pectoris: Secondary | ICD-10-CM | POA: Diagnosis not present

## 2019-07-28 DIAGNOSIS — F0390 Unspecified dementia without behavioral disturbance: Secondary | ICD-10-CM | POA: Diagnosis not present

## 2019-08-04 DIAGNOSIS — F0390 Unspecified dementia without behavioral disturbance: Secondary | ICD-10-CM | POA: Diagnosis not present

## 2019-08-04 DIAGNOSIS — R030 Elevated blood-pressure reading, without diagnosis of hypertension: Secondary | ICD-10-CM | POA: Diagnosis not present

## 2019-08-04 DIAGNOSIS — I2510 Atherosclerotic heart disease of native coronary artery without angina pectoris: Secondary | ICD-10-CM | POA: Diagnosis not present

## 2019-08-04 DIAGNOSIS — C3490 Malignant neoplasm of unspecified part of unspecified bronchus or lung: Secondary | ICD-10-CM | POA: Diagnosis not present

## 2019-08-06 DIAGNOSIS — I2510 Atherosclerotic heart disease of native coronary artery without angina pectoris: Secondary | ICD-10-CM | POA: Diagnosis not present

## 2019-08-06 DIAGNOSIS — R030 Elevated blood-pressure reading, without diagnosis of hypertension: Secondary | ICD-10-CM | POA: Diagnosis not present

## 2019-08-06 DIAGNOSIS — C3490 Malignant neoplasm of unspecified part of unspecified bronchus or lung: Secondary | ICD-10-CM | POA: Diagnosis not present

## 2019-08-06 DIAGNOSIS — F0390 Unspecified dementia without behavioral disturbance: Secondary | ICD-10-CM | POA: Diagnosis not present

## 2019-08-13 DIAGNOSIS — F0390 Unspecified dementia without behavioral disturbance: Secondary | ICD-10-CM | POA: Diagnosis not present

## 2019-08-13 DIAGNOSIS — I2510 Atherosclerotic heart disease of native coronary artery without angina pectoris: Secondary | ICD-10-CM | POA: Diagnosis not present

## 2019-08-13 DIAGNOSIS — C3490 Malignant neoplasm of unspecified part of unspecified bronchus or lung: Secondary | ICD-10-CM | POA: Diagnosis not present

## 2019-08-13 DIAGNOSIS — R030 Elevated blood-pressure reading, without diagnosis of hypertension: Secondary | ICD-10-CM | POA: Diagnosis not present

## 2019-08-14 ENCOUNTER — Telehealth: Payer: Self-pay

## 2019-08-14 DIAGNOSIS — F0390 Unspecified dementia without behavioral disturbance: Secondary | ICD-10-CM | POA: Diagnosis not present

## 2019-08-14 DIAGNOSIS — C3490 Malignant neoplasm of unspecified part of unspecified bronchus or lung: Secondary | ICD-10-CM | POA: Diagnosis not present

## 2019-08-14 DIAGNOSIS — I2510 Atherosclerotic heart disease of native coronary artery without angina pectoris: Secondary | ICD-10-CM | POA: Diagnosis not present

## 2019-08-14 DIAGNOSIS — R030 Elevated blood-pressure reading, without diagnosis of hypertension: Secondary | ICD-10-CM | POA: Diagnosis not present

## 2019-08-14 NOTE — Telephone Encounter (Signed)
Spoke with pts wife regarding appt on 08/17/19. Pts wife stated pt has not been doing well and do not want to bring pt to appt. Pts wife was offered a virtual appt and she accepted. Pts wife was advise to check pts vitals prior to appt.

## 2019-08-17 ENCOUNTER — Other Ambulatory Visit: Payer: Self-pay

## 2019-08-17 ENCOUNTER — Telehealth (INDEPENDENT_AMBULATORY_CARE_PROVIDER_SITE_OTHER): Payer: Medicare Other | Admitting: Internal Medicine

## 2019-08-17 DIAGNOSIS — Z950 Presence of cardiac pacemaker: Secondary | ICD-10-CM | POA: Diagnosis not present

## 2019-08-17 DIAGNOSIS — C3490 Malignant neoplasm of unspecified part of unspecified bronchus or lung: Secondary | ICD-10-CM | POA: Diagnosis not present

## 2019-08-17 DIAGNOSIS — I4891 Unspecified atrial fibrillation: Secondary | ICD-10-CM | POA: Diagnosis not present

## 2019-08-17 DIAGNOSIS — I442 Atrioventricular block, complete: Secondary | ICD-10-CM | POA: Diagnosis not present

## 2019-08-17 DIAGNOSIS — I2510 Atherosclerotic heart disease of native coronary artery without angina pectoris: Secondary | ICD-10-CM | POA: Diagnosis not present

## 2019-08-17 DIAGNOSIS — F0390 Unspecified dementia without behavioral disturbance: Secondary | ICD-10-CM | POA: Diagnosis not present

## 2019-08-17 DIAGNOSIS — I495 Sick sinus syndrome: Secondary | ICD-10-CM

## 2019-08-17 DIAGNOSIS — R030 Elevated blood-pressure reading, without diagnosis of hypertension: Secondary | ICD-10-CM | POA: Diagnosis not present

## 2019-08-17 NOTE — Patient Instructions (Signed)
Medication Instructions:  Your physician recommends that you continue on your current medications as directed. Please refer to the Current Medication list given to you today.  If you need a refill on your cardiac medications before your next appointment, please call your pharmacy.   Lab work: None Ordered   Testing/Procedures: None Ordered   Follow-Up: Your physician recommends that you schedule a follow-up appointment in: 1 year with [name]

## 2019-08-17 NOTE — Progress Notes (Signed)
Electrophysiology TeleHealth Note   Due to national recommendations of social distancing due to COVID 19, an audio/video telehealth visit is felt to be most appropriate for this patient at this time.  See MyChart message from today for the patient's consent to telehealth for [facility].   Date:  08/17/2019   ID:  Karl Howell, DOB 04-27-1934, MRN [id number]  Location: patient's home  Provider location: [address] [place]  Evaluation Performed: Follow-up visit  PCP:  [place]  Cardiologist:  No primary care provider on file.  Electrophysiologist:  Dr Lovena Le  Chief Complaint:  "no complaint"  History of Present Illness:    Karl Howell is a 85 y.o. male who presents via audio/video conferencing for a telehealth visit today.  Since last being seen in our clinic, the patient's care giver notes that he has been stable but does not speak much. Today, he denies symptoms of palpitations, chest pain, shortness of breath,  lower extremity edema, dizziness, presyncope, or syncope.  The patient is otherwise without complaint today.  The patient denies symptoms of fevers, chills, cough, or new SOB worrisome for COVID 19.  Past Medical History:  Diagnosis Date  . Arthritis   . Cancer ([place])    lung  . Coronary artery disease    ATHERECTOMY DONE IN [place].  [name] Dementia ([place])   . HTN (hypertension)   . Hyperlipidemia   . Paroxysmal atrial fibrillation (HCC)   . Symptomatic bradycardia    status post permanent transvenous pacemaker insertion by [name]    Past Surgical History:  Procedure Laterality Date  . CARDIAC CATHETERIZATION     1996    "cleaned out artery out"....no problems since  . CORONARY ANGIOPLASTY     1996  ??? in [place], [place]  . MULTIPLE TOOTH EXTRACTIONS    . PACEMAKER INSERTION  04-22-2014   STJ dual chamber pacemaker implanted for symptomatic sinus pauses  . RENAL ARTERY DUPLEX  03/11/13   NORMAL.  [name] STRESS  MYOCARDIAL PERFUSION STUDY  09/18/10   MILD TO MODERATE PERFUSION DEFECT IN THE BASAL INFERIOR AND MID INFERIOR REGION. THIS IS CONSISTANT WITH INFARCT/SCAR. NO ISCHEMIA.  . TONSILLECTOMY    . TOTAL KNEE ARTHROPLASTY  11/28/2011   Procedure: TOTAL KNEE ARTHROPLASTY;  Surgeon: [name], MD;  Location: [facility];  Service: Orthopedics;  Laterality: Left;  . TRANSTHORACIC ECHOCARDIOGRAM  03/11/13   EF 55% TO 60%. GRADE 1 DIASTOLIC DYSFUNCTION. VENTRICULAR SEPTUM: THICKNESS IS MILDLY INCREASED. SEPTAL MOTION SHOWED ABNORMAL FUNCTION AND DYSSYNERGY. MV: MILD REGURG.     Current Outpatient Medications  Medication Sig Dispense Refill  . amLODipine (NORVASC) 2.5 MG tablet TAKE 1 TABLET DAILY (Patient taking differently: Take 2.5 mg by mouth daily. ) 90 tablet 1  . cholecalciferol (VITAMIN D) 1000 units tablet Take 1,000 Units by mouth daily.    . Cyanocobalamin (B-12) 1000 MCG TABS     . losartan (COZAAR) 100 MG tablet TAKE 1 TABLET DAILY (Patient taking differently: Take 100 mg by mouth daily. ) 90 tablet 3  . potassium chloride 20 MEQ/15ML (10%) SOLN Take 20 mEq by mouth daily. Mixed in juice    . thiamine 100 MG tablet Take 1 tablet by mouth daily.     Alveda Reasons 20 MG TABS tablet TAKE 1 TABLET DAILY WITH SUPPER (Patient taking differently: Take 20 mg by mouth daily. ) 90 tablet 2   No current facility-administered medications for  this visit.     Allergies:   Other, Meclizine, and Hydrocodone   Social History:  The patient  reports that he has never smoked. He has never used smokeless tobacco. He reports current alcohol use of about 2.0 standard drinks of alcohol per week. He reports that he does not use drugs.   Family History:  The patient's  family history includes Heart attack in his brother and brother; Heart attack (age of onset: 65) in his mother; Stroke in his sister and sister; Stroke (age of onset: 18) in his father; Stroke (age of onset: 27) in his sister.   ROS:  Please see the  history of present illness.   All other systems are personally reviewed and negative.    Exam:    Vital Signs:  BP - 175/85   Labs/Other Tests and Data Reviewed:    Recent Labs: 01/19/2019: ALT 17; BUN 20; Creatinine, Ser 0.96; Hemoglobin 11.9; Platelets 148; Potassium 3.4; Sodium 141   Wt Readings from Last 3 Encounters:  01/26/19 134 lb 7.7 oz (61 kg)  01/19/19 135 lb 12.9 oz (61.6 kg)  12/31/18 136 lb (61.7 kg)     Other studies personally reviewed:  Last device remote is reviewed from [place] PDF dated 06/30/19 which reveals normal device function, no arrhythmias    ASSESSMENT & PLAN:    1.  CHB - he is asymptomatic, s/p PPM insertion. 2. Atrial fib - his rates have been controlled. 3. PPM - review of his interrogation demonstrates that his PPM is working normally. We will follow.  COVID 19 screen The patient denies symptoms of COVID 19 at this time.  The importance of social distancing was discussed today.  Follow-up:  1 year Next remote: 10/20  Current medicines are reviewed at length with the patient today.   The patient does not have concerns regarding his medicines.  The following changes were made today:  none  Labs/ tests ordered today include: none No orders of the defined types were placed in this encounter.    Patient Risk:  after full review of this patients clinical status, I feel that they are at moderate risk at this time.  Today, I have spent 15 minutes with the patient with telehealth technology discussing all of the above .    Signed, Cristopher Peru, MD  08/17/2019 2:27 PM     [facility] [address] [place] [address] [phone] (office) [phone] (fax)

## 2019-08-21 DIAGNOSIS — C3490 Malignant neoplasm of unspecified part of unspecified bronchus or lung: Secondary | ICD-10-CM | POA: Diagnosis not present

## 2019-08-21 DIAGNOSIS — I2510 Atherosclerotic heart disease of native coronary artery without angina pectoris: Secondary | ICD-10-CM | POA: Diagnosis not present

## 2019-08-21 DIAGNOSIS — R030 Elevated blood-pressure reading, without diagnosis of hypertension: Secondary | ICD-10-CM | POA: Diagnosis not present

## 2019-08-21 DIAGNOSIS — F0390 Unspecified dementia without behavioral disturbance: Secondary | ICD-10-CM | POA: Diagnosis not present

## 2019-08-24 DIAGNOSIS — R030 Elevated blood-pressure reading, without diagnosis of hypertension: Secondary | ICD-10-CM | POA: Diagnosis not present

## 2019-08-24 DIAGNOSIS — F0390 Unspecified dementia without behavioral disturbance: Secondary | ICD-10-CM | POA: Diagnosis not present

## 2019-08-24 DIAGNOSIS — C3490 Malignant neoplasm of unspecified part of unspecified bronchus or lung: Secondary | ICD-10-CM | POA: Diagnosis not present

## 2019-08-24 DIAGNOSIS — I2510 Atherosclerotic heart disease of native coronary artery without angina pectoris: Secondary | ICD-10-CM | POA: Diagnosis not present

## 2019-08-25 DIAGNOSIS — R030 Elevated blood-pressure reading, without diagnosis of hypertension: Secondary | ICD-10-CM | POA: Diagnosis not present

## 2019-08-25 DIAGNOSIS — C3490 Malignant neoplasm of unspecified part of unspecified bronchus or lung: Secondary | ICD-10-CM | POA: Diagnosis not present

## 2019-08-25 DIAGNOSIS — I2510 Atherosclerotic heart disease of native coronary artery without angina pectoris: Secondary | ICD-10-CM | POA: Diagnosis not present

## 2019-08-25 DIAGNOSIS — F0390 Unspecified dementia without behavioral disturbance: Secondary | ICD-10-CM | POA: Diagnosis not present

## 2019-09-02 DIAGNOSIS — I2510 Atherosclerotic heart disease of native coronary artery without angina pectoris: Secondary | ICD-10-CM | POA: Diagnosis not present

## 2019-09-02 DIAGNOSIS — R030 Elevated blood-pressure reading, without diagnosis of hypertension: Secondary | ICD-10-CM | POA: Diagnosis not present

## 2019-09-02 DIAGNOSIS — F0390 Unspecified dementia without behavioral disturbance: Secondary | ICD-10-CM | POA: Diagnosis not present

## 2019-09-02 DIAGNOSIS — C3490 Malignant neoplasm of unspecified part of unspecified bronchus or lung: Secondary | ICD-10-CM | POA: Diagnosis not present

## 2019-09-09 DIAGNOSIS — I2510 Atherosclerotic heart disease of native coronary artery without angina pectoris: Secondary | ICD-10-CM | POA: Diagnosis not present

## 2019-09-09 DIAGNOSIS — F0390 Unspecified dementia without behavioral disturbance: Secondary | ICD-10-CM | POA: Diagnosis not present

## 2019-09-09 DIAGNOSIS — R030 Elevated blood-pressure reading, without diagnosis of hypertension: Secondary | ICD-10-CM | POA: Diagnosis not present

## 2019-09-09 DIAGNOSIS — C3490 Malignant neoplasm of unspecified part of unspecified bronchus or lung: Secondary | ICD-10-CM | POA: Diagnosis not present

## 2019-09-11 DIAGNOSIS — R030 Elevated blood-pressure reading, without diagnosis of hypertension: Secondary | ICD-10-CM | POA: Diagnosis not present

## 2019-09-11 DIAGNOSIS — I2510 Atherosclerotic heart disease of native coronary artery without angina pectoris: Secondary | ICD-10-CM | POA: Diagnosis not present

## 2019-09-11 DIAGNOSIS — C3490 Malignant neoplasm of unspecified part of unspecified bronchus or lung: Secondary | ICD-10-CM | POA: Diagnosis not present

## 2019-09-11 DIAGNOSIS — F0390 Unspecified dementia without behavioral disturbance: Secondary | ICD-10-CM | POA: Diagnosis not present

## 2019-09-16 DIAGNOSIS — R030 Elevated blood-pressure reading, without diagnosis of hypertension: Secondary | ICD-10-CM | POA: Diagnosis not present

## 2019-09-16 DIAGNOSIS — F0390 Unspecified dementia without behavioral disturbance: Secondary | ICD-10-CM | POA: Diagnosis not present

## 2019-09-16 DIAGNOSIS — C3490 Malignant neoplasm of unspecified part of unspecified bronchus or lung: Secondary | ICD-10-CM | POA: Diagnosis not present

## 2019-09-16 DIAGNOSIS — I2510 Atherosclerotic heart disease of native coronary artery without angina pectoris: Secondary | ICD-10-CM | POA: Diagnosis not present

## 2019-09-19 DIAGNOSIS — F0390 Unspecified dementia without behavioral disturbance: Secondary | ICD-10-CM | POA: Diagnosis not present

## 2019-09-19 DIAGNOSIS — I2510 Atherosclerotic heart disease of native coronary artery without angina pectoris: Secondary | ICD-10-CM | POA: Diagnosis not present

## 2019-09-19 DIAGNOSIS — R030 Elevated blood-pressure reading, without diagnosis of hypertension: Secondary | ICD-10-CM | POA: Diagnosis not present

## 2019-09-19 DIAGNOSIS — C3490 Malignant neoplasm of unspecified part of unspecified bronchus or lung: Secondary | ICD-10-CM | POA: Diagnosis not present

## 2019-09-23 DIAGNOSIS — I2510 Atherosclerotic heart disease of native coronary artery without angina pectoris: Secondary | ICD-10-CM | POA: Diagnosis not present

## 2019-09-23 DIAGNOSIS — F0390 Unspecified dementia without behavioral disturbance: Secondary | ICD-10-CM | POA: Diagnosis not present

## 2019-09-23 DIAGNOSIS — C3490 Malignant neoplasm of unspecified part of unspecified bronchus or lung: Secondary | ICD-10-CM | POA: Diagnosis not present

## 2019-09-23 DIAGNOSIS — R030 Elevated blood-pressure reading, without diagnosis of hypertension: Secondary | ICD-10-CM | POA: Diagnosis not present

## 2019-09-24 DIAGNOSIS — F0390 Unspecified dementia without behavioral disturbance: Secondary | ICD-10-CM | POA: Diagnosis not present

## 2019-09-24 DIAGNOSIS — C3490 Malignant neoplasm of unspecified part of unspecified bronchus or lung: Secondary | ICD-10-CM | POA: Diagnosis not present

## 2019-09-24 DIAGNOSIS — I2510 Atherosclerotic heart disease of native coronary artery without angina pectoris: Secondary | ICD-10-CM | POA: Diagnosis not present

## 2019-09-24 DIAGNOSIS — R030 Elevated blood-pressure reading, without diagnosis of hypertension: Secondary | ICD-10-CM | POA: Diagnosis not present

## 2019-09-29 ENCOUNTER — Ambulatory Visit (INDEPENDENT_AMBULATORY_CARE_PROVIDER_SITE_OTHER): Payer: Medicare Other | Admitting: *Deleted

## 2019-09-29 DIAGNOSIS — I4819 Other persistent atrial fibrillation: Secondary | ICD-10-CM

## 2019-09-29 DIAGNOSIS — I442 Atrioventricular block, complete: Secondary | ICD-10-CM

## 2019-09-30 DIAGNOSIS — F0390 Unspecified dementia without behavioral disturbance: Secondary | ICD-10-CM | POA: Diagnosis not present

## 2019-09-30 DIAGNOSIS — I2510 Atherosclerotic heart disease of native coronary artery without angina pectoris: Secondary | ICD-10-CM | POA: Diagnosis not present

## 2019-09-30 DIAGNOSIS — C3490 Malignant neoplasm of unspecified part of unspecified bronchus or lung: Secondary | ICD-10-CM | POA: Diagnosis not present

## 2019-09-30 DIAGNOSIS — R030 Elevated blood-pressure reading, without diagnosis of hypertension: Secondary | ICD-10-CM | POA: Diagnosis not present

## 2019-09-30 LAB — CUP PACEART REMOTE DEVICE CHECK
Battery Remaining Longevity: 106 mo
Battery Remaining Percentage: 95.5 %
Battery Voltage: 2.96 V
Brady Statistic AP VP Percent: 40 %
Brady Statistic AP VS Percent: 6.2 %
Brady Statistic AS VP Percent: 38 %
Brady Statistic AS VS Percent: 3.7 %
Brady Statistic RA Percent Paced: 17 %
Brady Statistic RV Percent Paced: 83 %
Date Time Interrogation Session: 20201006060014
Implantable Lead Implant Date: 20150430
Implantable Lead Implant Date: 20150430
Implantable Lead Location: 753859
Implantable Lead Location: 753860
Implantable Pulse Generator Implant Date: 20150430
Lead Channel Impedance Value: 310 Ohm
Lead Channel Impedance Value: 560 Ohm
Lead Channel Pacing Threshold Amplitude: 0.5 V
Lead Channel Pacing Threshold Amplitude: 1 V
Lead Channel Pacing Threshold Pulse Width: 0.4 ms
Lead Channel Pacing Threshold Pulse Width: 0.4 ms
Lead Channel Sensing Intrinsic Amplitude: 0.5 mV
Lead Channel Sensing Intrinsic Amplitude: 7.8 mV
Lead Channel Setting Pacing Amplitude: 2 V
Lead Channel Setting Pacing Amplitude: 2.5 V
Lead Channel Setting Pacing Pulse Width: 0.4 ms
Lead Channel Setting Sensing Sensitivity: 2 mV
Pulse Gen Model: 2240
Pulse Gen Serial Number: 7618528

## 2019-10-08 NOTE — Progress Notes (Signed)
Remote pacemaker transmission.   

## 2019-10-09 DIAGNOSIS — R030 Elevated blood-pressure reading, without diagnosis of hypertension: Secondary | ICD-10-CM | POA: Diagnosis not present

## 2019-10-09 DIAGNOSIS — F0390 Unspecified dementia without behavioral disturbance: Secondary | ICD-10-CM | POA: Diagnosis not present

## 2019-10-09 DIAGNOSIS — I2510 Atherosclerotic heart disease of native coronary artery without angina pectoris: Secondary | ICD-10-CM | POA: Diagnosis not present

## 2019-10-09 DIAGNOSIS — C3490 Malignant neoplasm of unspecified part of unspecified bronchus or lung: Secondary | ICD-10-CM | POA: Diagnosis not present

## 2019-10-14 DIAGNOSIS — F0390 Unspecified dementia without behavioral disturbance: Secondary | ICD-10-CM | POA: Diagnosis not present

## 2019-10-14 DIAGNOSIS — C3490 Malignant neoplasm of unspecified part of unspecified bronchus or lung: Secondary | ICD-10-CM | POA: Diagnosis not present

## 2019-10-14 DIAGNOSIS — I2510 Atherosclerotic heart disease of native coronary artery without angina pectoris: Secondary | ICD-10-CM | POA: Diagnosis not present

## 2019-10-14 DIAGNOSIS — R030 Elevated blood-pressure reading, without diagnosis of hypertension: Secondary | ICD-10-CM | POA: Diagnosis not present

## 2019-10-20 DIAGNOSIS — F0390 Unspecified dementia without behavioral disturbance: Secondary | ICD-10-CM | POA: Diagnosis not present

## 2019-10-20 DIAGNOSIS — I2510 Atherosclerotic heart disease of native coronary artery without angina pectoris: Secondary | ICD-10-CM | POA: Diagnosis not present

## 2019-10-20 DIAGNOSIS — R030 Elevated blood-pressure reading, without diagnosis of hypertension: Secondary | ICD-10-CM | POA: Diagnosis not present

## 2019-10-20 DIAGNOSIS — C3490 Malignant neoplasm of unspecified part of unspecified bronchus or lung: Secondary | ICD-10-CM | POA: Diagnosis not present

## 2019-10-25 DIAGNOSIS — F0390 Unspecified dementia without behavioral disturbance: Secondary | ICD-10-CM | POA: Diagnosis not present

## 2019-10-25 DIAGNOSIS — R030 Elevated blood-pressure reading, without diagnosis of hypertension: Secondary | ICD-10-CM | POA: Diagnosis not present

## 2019-10-25 DIAGNOSIS — C3490 Malignant neoplasm of unspecified part of unspecified bronchus or lung: Secondary | ICD-10-CM | POA: Diagnosis not present

## 2019-10-25 DIAGNOSIS — I2510 Atherosclerotic heart disease of native coronary artery without angina pectoris: Secondary | ICD-10-CM | POA: Diagnosis not present

## 2019-10-29 DIAGNOSIS — F0390 Unspecified dementia without behavioral disturbance: Secondary | ICD-10-CM | POA: Diagnosis not present

## 2019-10-29 DIAGNOSIS — R030 Elevated blood-pressure reading, without diagnosis of hypertension: Secondary | ICD-10-CM | POA: Diagnosis not present

## 2019-10-29 DIAGNOSIS — I2510 Atherosclerotic heart disease of native coronary artery without angina pectoris: Secondary | ICD-10-CM | POA: Diagnosis not present

## 2019-10-29 DIAGNOSIS — C3490 Malignant neoplasm of unspecified part of unspecified bronchus or lung: Secondary | ICD-10-CM | POA: Diagnosis not present

## 2019-11-02 DIAGNOSIS — I2510 Atherosclerotic heart disease of native coronary artery without angina pectoris: Secondary | ICD-10-CM | POA: Diagnosis not present

## 2019-11-02 DIAGNOSIS — C3490 Malignant neoplasm of unspecified part of unspecified bronchus or lung: Secondary | ICD-10-CM | POA: Diagnosis not present

## 2019-11-02 DIAGNOSIS — R030 Elevated blood-pressure reading, without diagnosis of hypertension: Secondary | ICD-10-CM | POA: Diagnosis not present

## 2019-11-02 DIAGNOSIS — F0390 Unspecified dementia without behavioral disturbance: Secondary | ICD-10-CM | POA: Diagnosis not present

## 2019-11-09 DIAGNOSIS — F0390 Unspecified dementia without behavioral disturbance: Secondary | ICD-10-CM | POA: Diagnosis not present

## 2019-11-09 DIAGNOSIS — C3490 Malignant neoplasm of unspecified part of unspecified bronchus or lung: Secondary | ICD-10-CM | POA: Diagnosis not present

## 2019-11-09 DIAGNOSIS — I2510 Atherosclerotic heart disease of native coronary artery without angina pectoris: Secondary | ICD-10-CM | POA: Diagnosis not present

## 2019-11-09 DIAGNOSIS — R030 Elevated blood-pressure reading, without diagnosis of hypertension: Secondary | ICD-10-CM | POA: Diagnosis not present

## 2019-11-13 ENCOUNTER — Other Ambulatory Visit: Payer: Self-pay

## 2019-11-16 DIAGNOSIS — F0390 Unspecified dementia without behavioral disturbance: Secondary | ICD-10-CM | POA: Diagnosis not present

## 2019-11-16 DIAGNOSIS — I2510 Atherosclerotic heart disease of native coronary artery without angina pectoris: Secondary | ICD-10-CM | POA: Diagnosis not present

## 2019-11-16 DIAGNOSIS — C3490 Malignant neoplasm of unspecified part of unspecified bronchus or lung: Secondary | ICD-10-CM | POA: Diagnosis not present

## 2019-11-16 DIAGNOSIS — R030 Elevated blood-pressure reading, without diagnosis of hypertension: Secondary | ICD-10-CM | POA: Diagnosis not present

## 2019-11-20 DIAGNOSIS — C3490 Malignant neoplasm of unspecified part of unspecified bronchus or lung: Secondary | ICD-10-CM | POA: Diagnosis not present

## 2019-11-20 DIAGNOSIS — F0390 Unspecified dementia without behavioral disturbance: Secondary | ICD-10-CM | POA: Diagnosis not present

## 2019-11-20 DIAGNOSIS — R030 Elevated blood-pressure reading, without diagnosis of hypertension: Secondary | ICD-10-CM | POA: Diagnosis not present

## 2019-11-20 DIAGNOSIS — I2510 Atherosclerotic heart disease of native coronary artery without angina pectoris: Secondary | ICD-10-CM | POA: Diagnosis not present

## 2019-11-23 DIAGNOSIS — C3490 Malignant neoplasm of unspecified part of unspecified bronchus or lung: Secondary | ICD-10-CM | POA: Diagnosis not present

## 2019-11-23 DIAGNOSIS — I2510 Atherosclerotic heart disease of native coronary artery without angina pectoris: Secondary | ICD-10-CM | POA: Diagnosis not present

## 2019-11-23 DIAGNOSIS — F0390 Unspecified dementia without behavioral disturbance: Secondary | ICD-10-CM | POA: Diagnosis not present

## 2019-11-23 DIAGNOSIS — R030 Elevated blood-pressure reading, without diagnosis of hypertension: Secondary | ICD-10-CM | POA: Diagnosis not present

## 2019-12-29 ENCOUNTER — Ambulatory Visit (INDEPENDENT_AMBULATORY_CARE_PROVIDER_SITE_OTHER): Payer: Medicare Other | Admitting: *Deleted

## 2019-12-29 DIAGNOSIS — I442 Atrioventricular block, complete: Secondary | ICD-10-CM | POA: Diagnosis not present

## 2019-12-29 LAB — CUP PACEART REMOTE DEVICE CHECK
Battery Remaining Longevity: 106 mo
Battery Remaining Percentage: 95.5 %
Battery Voltage: 2.96 V
Brady Statistic AP VP Percent: 38 %
Brady Statistic AP VS Percent: 6.7 %
Brady Statistic AS VP Percent: 37 %
Brady Statistic AS VS Percent: 4.2 %
Brady Statistic RA Percent Paced: 18 %
Brady Statistic RV Percent Paced: 81 %
Date Time Interrogation Session: 20210105020013
Implantable Lead Implant Date: 20150430
Implantable Lead Implant Date: 20150430
Implantable Lead Location: 753859
Implantable Lead Location: 753860
Implantable Pulse Generator Implant Date: 20150430
Lead Channel Impedance Value: 310 Ohm
Lead Channel Impedance Value: 560 Ohm
Lead Channel Pacing Threshold Amplitude: 0.5 V
Lead Channel Pacing Threshold Amplitude: 1 V
Lead Channel Pacing Threshold Pulse Width: 0.4 ms
Lead Channel Pacing Threshold Pulse Width: 0.4 ms
Lead Channel Sensing Intrinsic Amplitude: 0.6 mV
Lead Channel Sensing Intrinsic Amplitude: 12 mV
Lead Channel Setting Pacing Amplitude: 2 V
Lead Channel Setting Pacing Amplitude: 2.5 V
Lead Channel Setting Pacing Pulse Width: 0.4 ms
Lead Channel Setting Sensing Sensitivity: 2 mV
Pulse Gen Model: 2240
Pulse Gen Serial Number: 7618528

## 2020-02-22 DEATH — deceased
